# Patient Record
Sex: Female | Born: 1985
Health system: Southern US, Community
[De-identification: ages and names within clinical notes are randomized; demographics above are authoritative.]

## PROBLEM LIST (undated history)

## (undated) ENCOUNTER — Inpatient Hospital Stay (HOSPITAL_COMMUNITY): Payer: Self-pay

## (undated) DIAGNOSIS — F329 Major depressive disorder, single episode, unspecified: Secondary | ICD-10-CM

## (undated) DIAGNOSIS — O009 Unspecified ectopic pregnancy without intrauterine pregnancy: Secondary | ICD-10-CM

## (undated) DIAGNOSIS — F32A Depression, unspecified: Secondary | ICD-10-CM

## (undated) DIAGNOSIS — K219 Gastro-esophageal reflux disease without esophagitis: Secondary | ICD-10-CM

## (undated) DIAGNOSIS — R079 Chest pain, unspecified: Secondary | ICD-10-CM

## (undated) DIAGNOSIS — B999 Unspecified infectious disease: Secondary | ICD-10-CM

## (undated) DIAGNOSIS — R87629 Unspecified abnormal cytological findings in specimens from vagina: Secondary | ICD-10-CM

## (undated) DIAGNOSIS — F419 Anxiety disorder, unspecified: Secondary | ICD-10-CM

## (undated) DIAGNOSIS — O210 Mild hyperemesis gravidarum: Secondary | ICD-10-CM

## (undated) DIAGNOSIS — I10 Essential (primary) hypertension: Secondary | ICD-10-CM

## (undated) HISTORY — PX: KNEE SURGERY: SHX244

## (undated) HISTORY — DX: Mild hyperemesis gravidarum: O21.0

## (undated) HISTORY — DX: Essential (primary) hypertension: I10

## (undated) HISTORY — DX: Anxiety disorder, unspecified: F41.9

## (undated) HISTORY — PX: TONSILLECTOMY AND ADENOIDECTOMY: SUR1326

## (undated) HISTORY — PX: CHOLECYSTECTOMY: SHX55

## (undated) HISTORY — DX: Depression, unspecified: F32.A

## (undated) HISTORY — DX: Unspecified abnormal cytological findings in specimens from vagina: R87.629

## (undated) HISTORY — DX: Gastro-esophageal reflux disease without esophagitis: K21.9

---
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
## 2007-12-21 DIAGNOSIS — R1110 Vomiting, unspecified: Secondary | ICD-10-CM

## 2007-12-21 DIAGNOSIS — O411290 Chorioamnionitis, unspecified trimester, not applicable or unspecified: Secondary | ICD-10-CM

## 2014-11-14 ENCOUNTER — Encounter: Payer: Self-pay | Admitting: Family Medicine

## 2014-11-14 ENCOUNTER — Ambulatory Visit (INDEPENDENT_AMBULATORY_CARE_PROVIDER_SITE_OTHER): Payer: BLUE CROSS/BLUE SHIELD

## 2014-11-14 ENCOUNTER — Ambulatory Visit (INDEPENDENT_AMBULATORY_CARE_PROVIDER_SITE_OTHER): Payer: BLUE CROSS/BLUE SHIELD | Admitting: Family Medicine

## 2014-11-14 VITALS — BP 140/84 | HR 72 | Ht 66.0 in | Wt 269.0 lb

## 2014-11-14 DIAGNOSIS — M542 Cervicalgia: Secondary | ICD-10-CM | POA: Insufficient documentation

## 2014-11-14 DIAGNOSIS — Z23 Encounter for immunization: Secondary | ICD-10-CM

## 2014-11-14 DIAGNOSIS — R030 Elevated blood-pressure reading, without diagnosis of hypertension: Secondary | ICD-10-CM

## 2014-11-14 DIAGNOSIS — IMO0001 Reserved for inherently not codable concepts without codable children: Secondary | ICD-10-CM

## 2014-11-14 NOTE — Assessment & Plan Note (Signed)
9. Diagnostic hypertension. Recheck next visit

## 2014-11-14 NOTE — Assessment & Plan Note (Signed)
Likely muscle spasm and myofascial strain. Treat with Tylenol physical therapy. Additionally consider a heating pad and TENS unit. X-ray today. Avoid high-dose NSAIDs or muscle relaxers to the patient becoming pregnant.  recommend patient start taking her prenatal vitamins. Return in 3 weeks.
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
## 2014-11-14 NOTE — Patient Instructions (Signed)
Thank you for coming in today. Take Tylenol for pain as needed. Use heating pad. Attend physical therapy And x-ray  TENS UNIT: This is helpful for muscle pain and spasm.   Search and Purchase a TENS 7000 2nd edition at www.tenspros.com. It should be less than $30.     TENS unit instructions: Do not shower or bathe with the unit on Turn the unit off before removing electrodes or batteries If the electrodes lose stickiness add a drop of water to the electrodes after they are disconnected from the unit and place on plastic sheet. If you continued to have difficulty, call the TENS unit company to purchase more electrodes. Do not apply lotion on the skin area prior to use. Make sure the skin is clean and dry as this will help prolong the life of the electrodes. After use, always check skin for unusual red areas, rash or other skin difficulties. If there are any skin problems, does not apply electrodes to the same area. Never remove the electrodes from the unit by pulling the wires. Do not use the TENS unit or electrodes other than as directed. Do not change electrode placement without consultating your therapist or physician. Keep 2 fingers with between each electrode. Wear time ratio is 2:1, on to off times.    For example on for 30 minutes off for 15 minutes and then on for 30 minutes off for 15 minutes

## 2014-11-14 NOTE — Progress Notes (Signed)
Quick Note:  Normal, no changes. ______ 

## 2014-11-19 ENCOUNTER — Telehealth: Payer: Self-pay

## 2014-11-19 MED ORDER — HYDROCODONE-ACETAMINOPHEN 5-325 MG PO TABS: 1.0000 | ORAL_TABLET | Freq: Four times a day (QID) | ORAL | Status: DC | PRN

## 2014-11-19 NOTE — Telephone Encounter (Signed)
Patient states the Tylenol is not helping with her neck pain. She was advised to call back if pain was not controlled by Tylenol.

## 2014-11-19 NOTE — Telephone Encounter (Signed)
Tylenol not controlling pain.  Patient trying to become pregnant therefore NSAIDs and Muscle relaxers not indicated.  Will use low dose norco.  Return in 1 week if not better.

## 2014-11-19 NOTE — Telephone Encounter (Signed)
Attempted to contact Pt regarding new Rx. No answer. Left voicemail informing of Rx and provided callback information.

## 2014-11-21 ENCOUNTER — Ambulatory Visit: Payer: BLUE CROSS/BLUE SHIELD | Admitting: Rehabilitative and Restorative Service Providers"

## 2014-11-25 ENCOUNTER — Encounter: Payer: Self-pay | Admitting: Rehabilitative and Restorative Service Providers"

## 2014-11-25 ENCOUNTER — Ambulatory Visit (INDEPENDENT_AMBULATORY_CARE_PROVIDER_SITE_OTHER): Payer: Self-pay | Admitting: Rehabilitative and Restorative Service Providers"

## 2014-11-25 DIAGNOSIS — M436 Torticollis: Secondary | ICD-10-CM

## 2014-11-25 DIAGNOSIS — Z7409 Other reduced mobility: Secondary | ICD-10-CM

## 2014-11-25 DIAGNOSIS — R293 Abnormal posture: Secondary | ICD-10-CM

## 2014-11-25 DIAGNOSIS — M542 Cervicalgia: Secondary | ICD-10-CM
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
## 2014-11-25 NOTE — Patient Instructions (Signed)
Work on posture throughout the day - lift chest and let shoulders relax down and back  [employer] work ~ 4 Careers adviser.   Axial Extension (Chin Tuck)   Pull chin in and lengthen back of neck. Hold __10__ seconds while counting out loud. Repeat __10__ times. Do __several__ sessions per day. Can do lying down on back head supported on pillow so spine is in a neutral position.    Shoulder Blade Squeeze   Rotate shoulders back, then squeeze shoulder blades down and back hold 10 sec Repeat __10__ times. Do _several___ sessions per day. Can use noodle along spine.  Scapula Adduction With Pectorals, Low   Stand in doorframe with palms against frame and arms at 45. Lean forward and squeeze shoulder blades. Hold _30__ seconds. Repeat _3__ times per session. Do _3__ sessions per day.     Scapula Adduction With Pectorals, Mid-Range   Stand in doorframe with palms against frame and arms at 90. Lean forward and squeeze shoulder blades. Hold __30_ seconds. Repeat 3___ times per session. Do _3__ sessions per day.    \Scapula Adduction With Pectorals, High   Stand in doorframe with palms against frame and arms at 120. Lean forward and squeeze shoulder blades. Hold __30_ seconds. Repeat _3__ times per session. Do _3__ sessions per day.

## 2014-11-27 ENCOUNTER — Encounter: Payer: BLUE CROSS/BLUE SHIELD | Admitting: Physical Therapy

## 2014-12-02 ENCOUNTER — Ambulatory Visit (INDEPENDENT_AMBULATORY_CARE_PROVIDER_SITE_OTHER): Payer: BLUE CROSS/BLUE SHIELD | Admitting: Physical Therapy

## 2014-12-02 ENCOUNTER — Encounter: Payer: Self-pay | Admitting: Physical Therapy

## 2014-12-02 DIAGNOSIS — R293 Abnormal posture: Secondary | ICD-10-CM | POA: Diagnosis not present

## 2014-12-02 DIAGNOSIS — M436 Torticollis: Secondary | ICD-10-CM | POA: Diagnosis not present

## 2014-12-02 DIAGNOSIS — M542 Cervicalgia: Secondary | ICD-10-CM | POA: Diagnosis not present

## 2014-12-02 DIAGNOSIS — Z7409 Other reduced mobility: Secondary | ICD-10-CM

## 2014-12-02 NOTE — Therapy (Signed)
[place], [place], [address] Phone: [phone]   Fax:  [phone]  Physical Therapy Treatment  Patient Details  Name: Ann Brock MRN: [id number] Date of Birth: 1985/06/09 Referring Provider:  Gregor Hams, MD  Encounter Date: 12/02/2014      PT End of Session - 12/02/14 1113    Visit Number 2   Date for PT Re-Evaluation 12/30/14   PT Start Time 1106   PT Stop Time 1201   PT Time Calculation (min) 55 min      History reviewed. No pertinent past medical history.  Past Surgical History  Procedure Laterality Date  . Cholecystectomy    . Cesarean section    . Knee surgery    . Tonsillectomy and adenoidectomy      There were no vitals filed for this visit.  Visit Diagnosis:  Neck pain  Abnormal posture  Stiffness of neck  Impaired mobility and endurance      Subjective Assessment - 12/02/14 1106    Subjective Pt has been trying to wean off medication over the last two days and she is very sore. Doing OK with her HEP    Currently in Pain? Yes   Pain Score 7    Pain Location Neck   Pain Orientation Mid   Pain Descriptors / Indicators Throbbing  snapping of rubber band down the center   Pain Type Acute pain   Pain Frequency Constant   Aggravating Factors  all the same as last visits    Pain Relieving Factors medication, ice . some heat.                          [facility] Adult PT Treatment/Exercise - 12/02/14 0001    Exercises   Exercises Neck   Neck Exercises: Machines for Strengthening   UBE (Upper Arm Bike) L1 x 4' alt FWB/BWD   Neck Exercises: Standing   Other Standing Exercises shouder shrugs x15     Neck Exercises: Supine   Other Supine Exercise 10 reps yellow band, OH, horizontl abd, SASH and ER   Shoulder Exercises: Standing   External Rotation Strengthening;Both;20 reps;Theraband  leaning on noodle   Theraband Level (Shoulder External  Rotation) Level 2 (Red)   Extension Strengthening;Both;20 reps  leaning on noodle   Moist Heat Therapy   Number Minutes Moist Heat 15 Minutes   Moist Heat Location Cervical   Electrical Stimulation   Electrical Stimulation Location bilat mid/lower cervical paraspinals   Electrical Stimulation Action IFC   Electrical Stimulation Parameters to tolerance   Electrical Stimulation Goals Pain   Manual Therapy   Manual Therapy Joint mobilization;Soft tissue mobilization;Manual Traction   Joint Mobilization attempted cervical mobs - pt unable to tolerate   Soft tissue mobilization bilat pecs, periocciput, upper traps/levators    Manual Traction cervical                 PT Education - 12/02/14 1116    Education provided Yes   Education Details HEP with yellow band   Person(s) Educated Patient   Methods Explanation;Demonstration;Handout   Comprehension Returned demonstration             PT Long Term Goals - 12/02/14 1250    PT LONG TERM GOAL #1   Title Pt I in HEP for discharge 12/30/14   Status On-going   PT LONG TERM GOAL #2   Title Cervical ROM WNL's and painfree  12/30/14   Status On-going   PT LONG TERM GOAL #3   Title Patient reports ability to turn head to check traffic when driving 82/70/78   Status On-going   PT LONG TERM GOAL #4   Title Decrease FOTO to </= 38% limitation 12/30/14   Status On-going               Plan - 12/02/14 1148    Clinical Impression Statement This is patients second visit, no goals met,  She continues to have cervical pain however responds well to exercise decreasing pain.  Her posture adds extra stress on the cervical  musculature.    Pt will benefit from skilled therapeutic intervention in order to improve on the following deficits Decreased range of motion;Decreased mobility;Pain;Increased fascial restricitons;Decreased activity tolerance;Decreased endurance   Rehab Potential Good   PT Frequency 2x / week   PT Duration 6  weeks   PT Treatment/Interventions Patient/family education;ADLs/Self Care Home Management;Therapeutic exercise;Therapeutic activities;Manual techniques;Cryotherapy;Electrical Stimulation;Moist Heat;Ultrasound;Traction;Dry needling   PT Next Visit Plan review HEP; continue work on posture and alignment; add manual work for cervical spine with passive stretching; progress with gentle cervical stretching   Consulted and Agree with Plan of Care Patient        Problem List Patient Active Problem List   Diagnosis Date Noted  . Neck pain 11/14/2014  . Elevated blood pressure 11/14/2014    Jeral Pinch PT 12/02/2014, 12:52 PM  [facility] [place], [place], [address] Phone: [phone]   Fax:  [phone]

## 2014-12-02 NOTE — Patient Instructions (Signed)
Over Head Pull: Narrow Grip     [place] [phone]   On back, knees bent, feet flat, band across thighs, elbows straight but relaxed. Pull hands apart (start). Keeping elbows straight, bring arms up and over head, hands toward floor. Keep pull steady on band. Hold momentarily. Return slowly, keeping pull steady, back to start. Repeat _10__ times. Band color yellow______   Side Pull: Double Arm   On back, knees bent, feet flat. Arms perpendicular to body, shoulder level, elbows straight but relaxed. Pull arms out to sides, elbows straight. Resistance band comes across collarbones, hands toward floor. Hold momentarily. Slowly return to starting position. Repeat 10___ times. Band color _yellow____   Sash   On back, knees bent, feet flat, left hand on left hip, right hand above left. Pull right arm DIAGONALLY (hip to shoulder) across chest. Bring right arm along head toward floor. Hold momentarily. Slowly return to starting position. Repeat _10__ times. Do with left arm. Band color _yellow_____   Shoulder Rotation: Double Arm   On back, knees bent, feet flat, elbows tucked at sides, bent 90, hands palms up. Pull hands apart and down toward floor, keeping elbows near sides. Hold momentarily. Slowly return to starting position. Repeat 10___ times. Band color __yello____

## 2014-12-04 ENCOUNTER — Ambulatory Visit (INDEPENDENT_AMBULATORY_CARE_PROVIDER_SITE_OTHER): Payer: BLUE CROSS/BLUE SHIELD | Admitting: Family Medicine

## 2014-12-04 ENCOUNTER — Encounter: Payer: Self-pay | Admitting: Family Medicine

## 2014-12-04 VITALS — BP 139/90 | HR 83 | Ht 66.0 in | Wt 268.0 lb

## 2014-12-04 DIAGNOSIS — IMO0001 Reserved for inherently not codable concepts without codable children: Secondary | ICD-10-CM

## 2014-12-04 DIAGNOSIS — Z3490 Encounter for supervision of normal pregnancy, unspecified, unspecified trimester: Secondary | ICD-10-CM | POA: Insufficient documentation

## 2014-12-04 DIAGNOSIS — R030 Elevated blood-pressure reading, without diagnosis of hypertension: Secondary | ICD-10-CM | POA: Diagnosis not present

## 2014-12-04 DIAGNOSIS — Z8759 Personal history of other complications of pregnancy, childbirth and the puerperium: Secondary | ICD-10-CM | POA: Diagnosis not present

## 2014-12-04 DIAGNOSIS — N3000 Acute cystitis without hematuria: Secondary | ICD-10-CM

## 2014-12-04 DIAGNOSIS — Z3491 Encounter for supervision of normal pregnancy, unspecified, first trimester: Secondary | ICD-10-CM | POA: Diagnosis not present

## 2014-12-04 LAB — POCT URINALYSIS DIPSTICK
BILIRUBIN UA: NEGATIVE
Blood, UA: NEGATIVE
Glucose, UA: NEGATIVE
Ketones, UA: NEGATIVE
NITRITE UA: NEGATIVE
PH UA: 6
Protein, UA: NEGATIVE
Spec Grav, UA: 1.025
Urobilinogen, UA: 0.2

## 2014-12-04 LAB — POCT URINE PREGNANCY: PREG TEST UR: POSITIVE — AB

## 2014-12-04 NOTE — Assessment & Plan Note (Signed)
No indication at this time for ultrasound. If patient has pain in her bleeding will obtain transvaginal ultrasound to confirm intrauterine pregnancy and obtain serial serum hCG.

## 2014-12-04 NOTE — Progress Notes (Signed)
Ann Brock is a 29 y.o. female who presents to [facility] today for pregnancy.  Her last visit. Was August 21. The Mirena IUD was removed on July 25. She is intending to become pregnant and is taking prenatal vitamins. She currently feels well. This is her fifth pregnancy. She is a G5 P2022 (one miscarriage, and one ectopic pregnancy treated medically). She notes that she's had elevated blood pressure prior to this pregnancy, and her previous pregnancies were complicated by hyperemesis gravidarum. She denies any urinary symptoms.   No past medical history on file. Past Surgical History  Procedure Laterality Date  . Cholecystectomy    . Cesarean section    . Knee surgery    . Tonsillectomy and adenoidectomy     Social History  Substance Use Topics  . Smoking status: Former Research scientist (life sciences)  . Smokeless tobacco: Not on file  . Alcohol Use: 0.0 oz/week    0 Standard drinks or equivalent per week   family history includes Alcohol abuse in her paternal grandfather and paternal grandmother; Cancer in her maternal grandfather, maternal grandmother, paternal grandfather, and paternal grandmother; Depression in her mother; Diabetes in her maternal grandmother; Drug abuse in her father; Heart disease in her father; Hyperlipidemia in her father; Hypertension in her father and mother; Stroke in her father, maternal grandfather, maternal grandmother, paternal grandfather, and paternal grandmother.  ROS as above Medications: Current Outpatient Prescriptions  Medication Sig Dispense Refill  . Prenatal MV-Min-Fe Fum-FA-DHA (PRENATAL 1 PO) Take by mouth.     No current facility-administered medications for this visit.   Allergies  Allergen Reactions  . Aspirin Anaphylaxis  . Cephalosporins Anaphylaxis  . Contrast Media  [Iodinated Diagnostic Agents] Anaphylaxis     Exam:  BP 139/90 mmHg  Pulse 83  Ht 5\' 6"  (1.676 m)  Wt 268 lb (121.564 kg)  BMI 43.28 kg/m2   LMP 11/07/2014 Gen: Well NAD HEENT: EOMI,  MMM Lungs: Normal work of breathing. CTABL Heart: RRR no MRG Abd: NABS, Soft. Nondistended, Nontender Exts: Brisk capillary refill, warm and well perfused.   Point-of-care urine pregnancy test positive. Urinalysis dipstick negative protein. Small leukocyte esterase  No results found for this or any previous visit (from the past 24 hour(s)). No results found.   Please see individual assessment and plan sections.

## 2014-12-04 NOTE — Patient Instructions (Signed)
Thank you for coming in today. Follow up with OBGYN.  Return sooner if you have problems.

## 2014-12-04 NOTE — Assessment & Plan Note (Signed)
Refer to OB/GYN. Continue prenatal vitamins.

## 2014-12-04 NOTE — Assessment & Plan Note (Signed)
OB/GYN will manage

## 2014-12-05 ENCOUNTER — Ambulatory Visit (INDEPENDENT_AMBULATORY_CARE_PROVIDER_SITE_OTHER): Payer: BLUE CROSS/BLUE SHIELD | Admitting: Rehabilitative and Restorative Service Providers"

## 2014-12-05 ENCOUNTER — Encounter: Payer: Self-pay | Admitting: Rehabilitative and Restorative Service Providers"

## 2014-12-05 DIAGNOSIS — M436 Torticollis: Secondary | ICD-10-CM | POA: Diagnosis not present

## 2014-12-05 DIAGNOSIS — R293 Abnormal posture: Secondary | ICD-10-CM | POA: Diagnosis not present

## 2014-12-05 DIAGNOSIS — N390 Urinary tract infection, site not specified: Secondary | ICD-10-CM | POA: Insufficient documentation

## 2014-12-05 DIAGNOSIS — Z7409 Other reduced mobility: Secondary | ICD-10-CM | POA: Diagnosis not present

## 2014-12-05 DIAGNOSIS — M542 Cervicalgia: Secondary | ICD-10-CM

## 2014-12-05 LAB — URINE CULTURE

## 2014-12-05 MED ORDER — NITROFURANTOIN MONOHYD MACRO 100 MG PO CAPS: 100.0000 mg | ORAL_CAPSULE | Freq: Two times a day (BID) | ORAL | Status: DC

## 2014-12-05 NOTE — Patient Instructions (Signed)
Resisted External Rotation: in Neutral - Bilateral   PALMS UP Sit or stand, tubing in both hands, elbows at sides, bent to 90, forearms forward. Pinch shoulder blades together and rotate forearms out. Keep elbows at sides. Repeat __10__ times per set. Do _2-3___ sets per session. Do _2-3___ sessions per day.   Low Row: Standing   Face anchor, feet shoulder width apart. Palms up, pull arms back, squeezing shoulder blades together. Repeat 10__ times per set. Do 2-3__ sets per session. Do 2-3__ sessions per week. Anchor Height: Waist     Strengthening: Resisted Extension   Hold tubing in right hand, arm forward. Pull arm back, elbow straight. Repeat _10___ times per set. Do 2-3____ sets per session. Do 2-3____ sessions per day.     Lying on back on swim noodle arms out to side 3-5 min Can bend elbows to release pull then straighten again.

## 2014-12-05 NOTE — Therapy (Signed)
[place], [place], [address] Phone: [phone]   Fax:  [phone]  Physical Therapy Treatment  Patient Details  Name: Ann Brock MRN: [id number] Date of Birth: 21-Sep-1985 Referring Marcell Pfeifer:  Gregor Hams, MD  Encounter Date: 12/05/2014      PT End of Session - 12/05/14 0902    Visit Number 3   Number of Visits 12   Date for PT Re-Evaluation 12/30/14   PT Start Time 0858   PT Stop Time 0945   PT Time Calculation (min) 47 min   Activity Tolerance Patient tolerated treatment well      History reviewed. No pertinent past medical history.  Past Surgical History  Procedure Laterality Date  . Cholecystectomy    . Cesarean section    . Knee surgery    . Tonsillectomy and adenoidectomy      There were no vitals filed for this visit.  Visit Diagnosis:  Neck pain  Abnormal posture  Stiffness of neck  Impaired mobility and endurance      Subjective Assessment - 12/05/14 0858    Subjective Found out she is pregnant. Not taking medication now and can feel a difference - more pain. Discussed use of e-stim for neck with pregnancy and patient is OK with continuing use - not contraindicated with distance from uterus    Currently in Pain? Yes   Pain Score 6    Pain Location Neck   Pain Orientation Mid   Pain Descriptors / Indicators Throbbing                         [facility] Adult PT Treatment/Exercise - 12/05/14 0001    Neck Exercises: Machines for Strengthening   UBE (Upper Arm Bike) L1 x 4' alt FWB/BWD   Neck Exercises: Standing   Neck Retraction 10 reps;10 secs   Other Standing Exercises shouder shrugs x15     Neck Exercises: Supine   Neck Retraction 5 reps;10 secs   Shoulder Exercises: Standing   External Rotation Strengthening;Both;20 reps;Theraband  leaning on noodle   Theraband Level (Shoulder External Rotation) Level 2 (Red)   Extension  Strengthening;Both;20 reps  leaning on noodle   Theraband Level (Shoulder Extension) Level 2 (Red)   Row Strengthening;Both;20 reps;Theraband   Theraband Level (Shoulder Row) Level 2 (Red)   Retraction 10 reps   Shoulder Exercises: IT sales professional Limitations doorway stretch 3 positions 3 reps 30 sec   Moist Heat Therapy   Number Minutes Moist Heat 15 Minutes   Moist Heat Location Cervical   Electrical Stimulation   Electrical Stimulation Location bilat mid/lower cervical paraspinals   Electrical Stimulation Action IFC   Electrical Stimulation Parameters to tolerance   Electrical Stimulation Goals Pain   Manual Therapy   Soft tissue mobilization bilat pecs, periocciput, upper traps/levators    Passive ROM cervical stretch to tolerance - not full ROM into flexion x2 15 sec hold; lateral flex x1 15 sec hold pt supine   Manual Traction cervical                 PT Education - 12/05/14 0931    Education provided Yes   Education Details working on posture and alignment/added ex for home; issued red TB for home   Person(s) Educated Patient   Methods Explanation;Demonstration;Tactile cues;Verbal cues;Handout   Comprehension Verbalized understanding;Returned demonstration;Verbal cues required;Tactile cues required  PT Long Term Goals - 12/02/14 1250    PT LONG TERM GOAL #1   Title Pt I in HEP for discharge 12/30/14   Status On-going   PT LONG TERM GOAL #2   Title Cervical ROM WNL's and painfree 12/30/14   Status On-going   PT LONG TERM GOAL #3   Title Patient reports ability to turn head to check traffic when driving 33/54/56   Status On-going   PT LONG TERM GOAL #4   Title Decrease FOTO to </= 38% limitation 12/30/14   Status On-going               Plan - 12/05/14 0903    Clinical Impression Statement Patient is pregnant and unable to use medication now. We will continue to work on posture and alignment, progressing with stretching pecs  and posterior shoulder girdle    Pt will benefit from skilled therapeutic intervention in order to improve on the following deficits Decreased range of motion;Decreased mobility;Pain;Increased fascial restricitons;Decreased activity tolerance;Decreased endurance   Rehab Potential Good   PT Frequency 2x / week   PT Duration 6 weeks   PT Treatment/Interventions Patient/family education;ADLs/Self Care Home Management;Therapeutic exercise;Therapeutic activities;Manual techniques;Cryotherapy;Electrical Stimulation;Moist Heat;Ultrasound;Traction;Dry needling   PT Next Visit Plan review HEP; continue work on posture and alignment; add manual work for cervical spine with passive stretching; progress with gentle cervical stretching   PT Home Exercise Plan postural correction; HEP   Consulted and Agree with Plan of Care Patient        Problem List Patient Active Problem List   Diagnosis Date Noted  . Normal pregnancy 12/04/2014  . History of ectopic pregnancy 12/04/2014  . Neck pain 11/14/2014  . Elevated blood pressure 11/14/2014    Celyn Nilda Simmer PT, MPH 12/05/2014, 9:38 AM  [facility] [place], [place], [address] Phone: [phone]   Fax:  [phone]

## 2014-12-05 NOTE — Assessment & Plan Note (Signed)
Patient returned to the front desk of the clinic the next day complaining of urinary frequency. Based on urinalysis with positive leukocyte esterase. We'll start nitrofurantoin. Culture pending.

## 2014-12-05 NOTE — Addendum Note (Signed)
Addended by: Gregor Hams on: 12/05/2014 10:24 AM   Modules accepted: Orders

## 2014-12-06 NOTE — Progress Notes (Signed)
Quick Note:  Culture shows multiple different type of bacteria. Continue antibiotics. ______

## 2014-12-09 ENCOUNTER — Ambulatory Visit (INDEPENDENT_AMBULATORY_CARE_PROVIDER_SITE_OTHER): Payer: BLUE CROSS/BLUE SHIELD | Admitting: Family Medicine

## 2014-12-09 ENCOUNTER — Encounter: Payer: Self-pay | Admitting: Family Medicine

## 2014-12-09 VITALS — BP 132/92 | HR 81 | Ht 66.0 in | Wt 269.0 lb

## 2014-12-09 DIAGNOSIS — N3000 Acute cystitis without hematuria: Secondary | ICD-10-CM

## 2014-12-09 DIAGNOSIS — M542 Cervicalgia: Secondary | ICD-10-CM

## 2014-12-09 DIAGNOSIS — Z3491 Encounter for supervision of normal pregnancy, unspecified, first trimester: Secondary | ICD-10-CM | POA: Diagnosis not present

## 2014-12-09 NOTE — Assessment & Plan Note (Signed)
Doing well. Continue Tylenol physical therapy and TENS unit

## 2014-12-09 NOTE — Progress Notes (Signed)
Ann Brock is a 29 y.o. female who presents to [facility]  today for follow-up neck pain and urinary tract infection. Patient was seen recently diagnosed with a cervical neck strain. X-ray was essentially normal. She was treated with Tylenol and physical therapy. She's had a few visits with physical therapy this helped significantly. She notes the pain is still present but is less than it has been. She denies any significant radiating pain weakness or numbness.  Additionally patient notes that she was seen recently and diagnosed with pregnancy. Urinalysis at that time showed leukocyte esterase however at this time she was asymptomatic. She returned the next day noted urinary frequency. She was given nitrofurantoin to take. She has not started taking it yet and is still symptomatic. She was waiting on culture results. Urine culture showed 100,000 colony-forming units of multiple morphotypes. She denies any abdominal pain fevers or chills.  Pregnancy: Currently going well. Very early. She is taking prenatal vitamins. She is an appointment with her OB/GYN doctor on October 12.  She has sepsis okay to eat hot dogs for lunch meats. She was told by a friend that she should not do this while pregnant. She was able to safely consume lunch meats and hot dogs during her last 2 pregnancies and wonders what's going on.   No past medical history on file. Past Surgical History  Procedure Laterality Date  . Cholecystectomy    . Cesarean section    . Knee surgery    . Tonsillectomy and adenoidectomy     Social History  Substance Use Topics  . Smoking status: Former Research scientist (life sciences)  . Smokeless tobacco: Not on file  . Alcohol Use: 0.0 oz/week    0 Standard drinks or equivalent per week   family history includes Alcohol abuse in her paternal grandfather and paternal grandmother; Cancer in her maternal grandfather, maternal grandmother, paternal grandfather, and paternal  grandmother; Depression in her mother; Diabetes in her maternal grandmother; Drug abuse in her father; Heart disease in her father; Hyperlipidemia in her father; Hypertension in her father and mother; Stroke in her father, maternal grandfather, maternal grandmother, paternal grandfather, and paternal grandmother.  ROS as above Medications: Current Outpatient Prescriptions  Medication Sig Dispense Refill  . nitrofurantoin, macrocrystal-monohydrate, (MACROBID) 100 MG capsule Take 1 capsule (100 mg total) by mouth 2 (two) times daily. 14 capsule 0  . Prenatal MV-Min-Fe Fum-FA-DHA (PRENATAL 1 PO) Take by mouth.     No current facility-administered medications for this visit.   Allergies  Allergen Reactions  . Aspirin Anaphylaxis  . Cephalosporins Anaphylaxis  . Contrast Media  [Iodinated Diagnostic Agents] Anaphylaxis     Exam:  BP 132/92 mmHg  Pulse 81  Ht 5\' 6"  (1.676 m)  Wt 269 lb (122.018 kg)  BMI 43.44 kg/m2  LMP 11/07/2014 Gen: Well NAD HEENT: EOMI,  MMM Lungs: Normal work of breathing. CTABL Heart: RRR no MRG Abd: NABS, Soft. Nondistended, Nontender Exts: Brisk capillary refill, warm and well perfused.  Neck: Nontender to midline normal neck range of motion. Negative Spurling's test. Upper extremity reflexes sensation and strength are equal and normal throughout.  No results found for this or any previous visit (from the past 24 hour(s)). No results found.   Please see individual assessment and plan sections.

## 2014-12-09 NOTE — Assessment & Plan Note (Signed)
Discussed risk of listeria. Discussed risk of motor vehicle collision death at 10.3 per 100,000 versus listeria at 0.26 per 100,000. Recommend avoiding risk of listeria if possible by reducing intake of hot dogs and daily meat etc. however discussed risks versus benefit. Follow-up with OB/GYN

## 2014-12-09 NOTE — Patient Instructions (Signed)
Thank you for coming in today. Continue physical therapy.  Take the antibiotic.  Return as needed.  Follow up with OBGYN.  Look up Listeria info about hotdogs and cheeses and lunch meats.   Use a TENS unit and Heating Pads.

## 2014-12-09 NOTE — Assessment & Plan Note (Signed)
Recommend start nitrofurantoin. Return as needed

## 2014-12-10 ENCOUNTER — Encounter: Payer: BLUE CROSS/BLUE SHIELD | Admitting: Physical Therapy

## 2014-12-11 ENCOUNTER — Ambulatory Visit (INDEPENDENT_AMBULATORY_CARE_PROVIDER_SITE_OTHER): Payer: Self-pay | Admitting: Physical Therapy

## 2014-12-11 DIAGNOSIS — R293 Abnormal posture: Secondary | ICD-10-CM

## 2014-12-11 DIAGNOSIS — M542 Cervicalgia: Secondary | ICD-10-CM

## 2014-12-11 DIAGNOSIS — M436 Torticollis: Secondary | ICD-10-CM

## 2014-12-11 DIAGNOSIS — Z7409 Other reduced mobility: Secondary | ICD-10-CM

## 2014-12-11 NOTE — Therapy (Signed)
[place], [place], [address] Phone: [phone]   Fax:  [phone]  Physical Therapy Treatment  Patient Details  Name: Ann Brock MRN: [id number] Date of Birth: 06-09-1985 Referring Provider:  Gregor Hams, MD  Encounter Date: 12/11/2014      PT End of Session - 12/11/14 0852    Visit Number 4   Number of Visits 12   Date for PT Re-Evaluation 12/30/14   PT Start Time 0849   PT Stop Time 0946   PT Time Calculation (min) 57 min      No past medical history on file.  Past Surgical History  Procedure Laterality Date  . Cholecystectomy    . Cesarean section    . Knee surgery    . Tonsillectomy and adenoidectomy      There were no vitals filed for this visit.  Visit Diagnosis:  Neck pain  Abnormal posture  Stiffness of neck  Impaired mobility and endurance      Subjective Assessment - 12/11/14 0852    Subjective Pt reports no change since last visit.  Pt using heating pad and TENS unit at end of day to decrease pain.  Has been performing HEP 2x/day.    Currently in Pain? Yes   Pain Score 5    Pain Location Neck   Pain Orientation Mid;Right;Left   Pain Descriptors / Indicators Aching;Throbbing   Aggravating Factors  weather, looking up and down   Pain Relieving Factors heat, ice, TENS machine.             [facility] PT Assessment - 12/11/14 0001    AROM   AROM Assessment Site --  pain with all motions   Cervical Flexion 25   Cervical Extension 35   Cervical - Right Side Bend 20   Cervical - Left Side Bend 22   Cervical - Right Rotation 35   Cervical - Left Rotation 30             [facility] Adult PT Treatment/Exercise - 12/11/14 0001    Neck Exercises: Seated   Upper Extremity D2 5 reps;Flexion;Theraband   Theraband Level (UE D2) Level 1 (Yellow)   Other Seated Exercise trial of head presses and shoulder presses x 5 reps each (with head support)   Neck  Exercises: Supine   Neck Retraction 5 reps;3 secs  chin tucks.    Shoulder Flexion Right;Left;5 reps   Other Supine Exercise Hooklying on 1/2 foam roll: bilateral shoulder horizontal abduction held 1 min for stretch, then 5 snow angels (to 90 deg- unable to tolerate higher than this due to increased pain in post neck).    Other Supine Exercise Head presses 5 sec hold x 10, shoulder presses 5 sec hold x 10 reps; Horizontal shoulder abd with red band x 10, Sash x 10 each arm, shoulder ER with red band x 10    Moist Heat Therapy   Number Minutes Moist Heat 15 Minutes   Moist Heat Location Cervical   Electrical Stimulation   Electrical Stimulation Location bilat mid/lower cervical paraspinals   Electrical Stimulation Action IFC   Electrical Stimulation Parameters to tolerance    Electrical Stimulation Goals Pain     Prior to MHP and estim, pt performed neck stretches (gentle): cervical rotation, lateral flexion, flexion - all head 15 sec x 2 reps each.            PT Education - 12/11/14 1350    Education  provided Yes   Education Details red band use if supine, yellow if sitting    Person(s) Educated Patient   Methods Explanation   Comprehension Verbalized understanding;Returned demonstration             PT Long Term Goals - 12/02/14 1250    PT LONG TERM GOAL #1   Title Pt I in HEP for discharge 12/30/14   Status On-going   PT LONG TERM GOAL #2   Title Cervical ROM WNL's and painfree 12/30/14   Status On-going   PT LONG TERM GOAL #3   Title Patient reports ability to turn head to check traffic when driving 38/25/05   Status On-going   PT LONG TERM GOAL #4   Title Decrease FOTO to </= 38% limitation 12/30/14   Status On-going               Plan - 12/11/14 0903    Clinical Impression Statement Pt continues with limited cervical ROM and pain with these motions. Pt tolerated unilateral doorway pec stretch better than bilateral. Pt tolerated red band better in  supine, yellow band in sitting (pt informed to make this modification for HEP).  Pt reported decrease in pain by 1 point with ther ex and 2-3 additional points after MHP and estim to neck/thoracic area.     Pt will benefit from skilled therapeutic intervention in order to improve on the following deficits Decreased range of motion;Decreased mobility;Pain;Increased fascial restricitons;Decreased activity tolerance;Decreased endurance   Rehab Potential Good   PT Frequency 2x / week   PT Duration 6 weeks   PT Treatment/Interventions Patient/family education;ADLs/Self Care Home Management;Therapeutic exercise;Therapeutic activities;Manual techniques;Cryotherapy;Electrical Stimulation;Moist Heat;Ultrasound;Traction;Dry needling   PT Next Visit Plan Continue progressive postural work/stretching and strengthening of neck. Manual work and modalities as needed.    Consulted and Agree with Plan of Care Patient        Problem List Patient Active Problem List   Diagnosis Date Noted  . UTI (urinary tract infection) 12/05/2014  . Normal pregnancy 12/04/2014  . History of ectopic pregnancy 12/04/2014  . Neck pain 11/14/2014  . Elevated blood pressure 11/14/2014    Kerin Perna, PTA 12/11/2014 1:56 PM  [facility] [place], [place], [address] Phone: [phone]   Fax:  [phone]

## 2014-12-17 ENCOUNTER — Ambulatory Visit (INDEPENDENT_AMBULATORY_CARE_PROVIDER_SITE_OTHER): Payer: Self-pay | Admitting: Physical Therapy

## 2014-12-17 DIAGNOSIS — M542 Cervicalgia: Secondary | ICD-10-CM

## 2014-12-17 DIAGNOSIS — R293 Abnormal posture: Secondary | ICD-10-CM

## 2014-12-17 NOTE — Therapy (Signed)
[place], [place], [address] Phone: [phone]   Fax:  [phone]  Physical Therapy Treatment  Patient Details  Name: Ann Brock MRN: [id number] Date of Birth: 04/23/1985 Referring Provider:  Gregor Hams, MD  Encounter Date: 12/17/2014      PT End of Session - 12/17/14 0936    Visit Number 5   Number of Visits 12   Date for PT Re-Evaluation 12/30/14   PT Start Time 7124   PT Stop Time 0944   PT Time Calculation (min) 57 min   Activity Tolerance Patient tolerated treatment well      No past medical history on file.  Past Surgical History  Procedure Laterality Date  . Cholecystectomy    . Cesarean section    . Knee surgery    . Tonsillectomy and adenoidectomy      There were no vitals filed for this visit.  Visit Diagnosis:  Neck pain  Abnormal posture      Subjective Assessment - 12/17/14 0855    Subjective Pt reports she was "doing pretty good" after last session, until she had to work double shifts (18hrs) over weekend.  She states that she heard a pop in neck when just looking around.  Pain no longer throbbing, just achey.    Pertinent History 3 knee surgeries; gall bladder surgery; c-section; currently pregnant (due May 27)    Currently in Pain? Yes   Pain Score 4    Pain Location Neck   Pain Orientation Mid;Left;Right   Pain Descriptors / Indicators Dull;Aching   Aggravating Factors  looking up/ down   Pain Relieving Factors heat, ice, TENS machine.             [facility] Adult PT Treatment/Exercise - 12/17/14 0001    Exercises   Exercises Shoulder;Neck   Neck Exercises: Machines for Strengthening   UBE (Upper Arm Bike) L2 x 4.5' alt FWB/BWD  standing   Neck Exercises: Theraband   Rows Red;10 reps  2 sets; seated   Neck Exercises: Seated   Upper Extremity D2 Flexion;Theraband;10 reps   Theraband Level (UE D2) Level 1 (Yellow)  (1st set x 10, 2nd set 15  reps)   Shoulder Exercises: Seated   Other Seated Exercises Wood chop with yellow band x 8 reps each side    Shoulder Exercises: IT sales professional Limitations doorway stretch 3 positions 3 reps 30 sec   Moist Heat Therapy   Number Minutes Moist Heat 15 Minutes   Moist Heat Location Cervical   Electrical Stimulation   Electrical Stimulation Location bilat mid/lower cervical paraspinals   Electrical Stimulation Action IFC    Electrical Stimulation Parameters to tolerance x 15 min    Electrical Stimulation Goals Pain   Manual Therapy   Manual Therapy Soft tissue massage with Edge tool assistance to Rt/Lt upper trap, cervical paraspinals, rhomboids, levator - to decrease fascial restrictions and pain.    Neck Exercises: Stretches   Upper Trapezius Stretch 20 seconds;2 reps   Levator Stretch 2 reps;20 seconds                     PT Long Term Goals - 12/02/14 1250    PT LONG TERM GOAL #1   Title Pt I in HEP for discharge 12/30/14   Status On-going   PT LONG TERM GOAL #2   Title Cervical ROM WNL's and painfree 12/30/14   Status On-going   PT  LONG TERM GOAL #3   Title Patient reports ability to turn head to check traffic when driving 88/32/54   Status On-going   PT LONG TERM GOAL #4   Title Decrease FOTO to </= 38% limitation 12/30/14   Status On-going               Plan - 12/17/14 0936    Clinical Impression Statement Pt tolerated new exercises with slight decrease in pain. Pt reported decreased tightness/improved mobility in cervical spine after manual therapy.  Progressing well towards goals.    Pt will benefit from skilled therapeutic intervention in order to improve on the following deficits Decreased range of motion;Decreased mobility;Pain;Increased fascial restricitons;Decreased activity tolerance;Decreased endurance   Rehab Potential Good   PT Frequency 2x / week   PT Duration 6 weeks   PT Treatment/Interventions Patient/family education;ADLs/Self  Care Home Management;Therapeutic exercise;Therapeutic activities;Manual techniques;Cryotherapy;Electrical Stimulation;Moist Heat;Ultrasound;Traction;Dry needling   PT Next Visit Plan Continue progressive postural work/stretching and strengthening of neck. Manual work and modalities as needed. Progress HEP to include new exercises if tolerated after next session.    Consulted and Agree with Plan of Care Patient        Problem List Patient Active Problem List   Diagnosis Date Noted  . UTI (urinary tract infection) 12/05/2014  . Normal pregnancy 12/04/2014  . History of ectopic pregnancy 12/04/2014  . Neck pain 11/14/2014  . Elevated blood pressure 11/14/2014   Kerin Perna, PTA 12/17/2014 1:22 PM  [facility] [place], [place], [address] Phone: [phone]   Fax:  [phone]

## 2014-12-18 ENCOUNTER — Inpatient Hospital Stay (HOSPITAL_COMMUNITY)
Admission: AD | Admit: 2014-12-18 | Discharge: 2014-12-18 | Disposition: A | Discharge status: Home / Self Care | Payer: BLUE CROSS/BLUE SHIELD | Source: Ambulatory Visit | Attending: Obstetrics and Gynecology | Admitting: Obstetrics and Gynecology

## 2014-12-18 ENCOUNTER — Inpatient Hospital Stay (HOSPITAL_COMMUNITY): Payer: BLUE CROSS/BLUE SHIELD

## 2014-12-18 ENCOUNTER — Encounter (HOSPITAL_COMMUNITY): Payer: Self-pay | Admitting: *Deleted

## 2014-12-18 DIAGNOSIS — R102 Pelvic and perineal pain: Secondary | ICD-10-CM | POA: Diagnosis present

## 2014-12-18 DIAGNOSIS — O219 Vomiting of pregnancy, unspecified: Secondary | ICD-10-CM

## 2014-12-18 DIAGNOSIS — O26891 Other specified pregnancy related conditions, first trimester: Secondary | ICD-10-CM

## 2014-12-18 DIAGNOSIS — R112 Nausea with vomiting, unspecified: Secondary | ICD-10-CM | POA: Diagnosis present

## 2014-12-18 DIAGNOSIS — Z3A01 Less than 8 weeks gestation of pregnancy: Secondary | ICD-10-CM | POA: Insufficient documentation

## 2014-12-18 LAB — WET PREP, GENITAL
Clue Cells Wet Prep HPF POC: NONE SEEN
TRICH WET PREP: NONE SEEN
YEAST WET PREP: NONE SEEN

## 2014-12-18 LAB — URINALYSIS, ROUTINE W REFLEX MICROSCOPIC
Bilirubin Urine: NEGATIVE
GLUCOSE, UA: NEGATIVE mg/dL
KETONES UR: NEGATIVE mg/dL
Nitrite: NEGATIVE
PROTEIN: NEGATIVE mg/dL
Specific Gravity, Urine: >1.03 — ABNORMAL HIGH (ref 1.005–1.030)
Urobilinogen, UA: 0.2 mg/dL (ref 0.0–1.0)
pH: 5.5 (ref 5.0–8.0)

## 2014-12-18 LAB — CBC
HEMATOCRIT: 36.2 % (ref 36.0–46.0)
Hemoglobin: 12 g/dL (ref 12.0–15.0)
MCH: 27.7 pg (ref 26.0–34.0)
MCHC: 33.1 g/dL (ref 30.0–36.0)
MCV: 83.6 fL (ref 78.0–100.0)
Platelets: 190 10*3/uL (ref 150–400)
RBC: 4.33 MIL/uL (ref 3.87–5.11)
RDW: 13.7 % (ref 11.5–15.5)
WBC: 9.5 10*3/uL (ref 4.0–10.5)

## 2014-12-18 LAB — HCG, QUANTITATIVE, PREGNANCY: HCG, BETA CHAIN, QUANT, S: 21058 m[IU]/mL — AB (ref <5)

## 2014-12-18 LAB — URINE MICROSCOPIC-ADD ON

## 2014-12-18 MED ORDER — PROMETHAZINE HCL 25 MG PO TABS: 12.5000 mg | ORAL_TABLET | Freq: Four times a day (QID) | ORAL | Status: DC | PRN

## 2014-12-18 NOTE — MAU Note (Signed)
Pt presents to MAU with complaints of nausea and vomiting for two weeks and lower abdominal cramping started today.

## 2014-12-18 NOTE — Discharge Instructions (Signed)
Morning Sickness Morning sickness is when you feel sick to your stomach (nauseous) during pregnancy. This nauseous feeling may or may not come with vomiting. It often occurs in the morning but can be a problem any time of day. Morning sickness is most common during the first trimester, but it may continue throughout pregnancy. While morning sickness is unpleasant, it is usually harmless unless you develop severe and continual vomiting (hyperemesis gravidarum). This condition requires more intense treatment.  CAUSES  The cause of morning sickness is not completely known but seems to be related to normal hormonal changes that occur in pregnancy. RISK FACTORS You are at greater risk if you:  Experienced nausea or vomiting before your pregnancy.  Had morning sickness during a previous pregnancy.  Are pregnant with more than one baby, such as twins. TREATMENT  Do not use any medicines (prescription, over-the-counter, or herbal) for morning sickness without first talking to your health care provider. Your health care provider may prescribe or recommend:  Vitamin B6 supplements.  Anti-nausea medicines.  The herbal medicine ginger. HOME CARE INSTRUCTIONS   Only take over-the-counter or prescription medicines as directed by your health care provider.  Taking multivitamins before getting pregnant can prevent or decrease the severity of morning sickness in most women.  Eat a piece of dry toast or unsalted crackers before getting out of bed in the morning.  Eat five or six small meals a day.  Eat dry and bland foods (rice, baked potato). Foods high in carbohydrates are often helpful.  Do not drink liquids with your meals. Drink liquids between meals.  Avoid greasy, fatty, and spicy foods.  Get someone to cook for you if the smell of any food causes nausea and vomiting.  If you feel nauseous after taking prenatal vitamins, take the vitamins at night or with a snack.  Snack on protein  foods (nuts, yogurt, cheese) between meals if you are hungry.  Eat unsweetened gelatins for desserts.  Wearing an acupressure wristband (worn for sea sickness) may be helpful.  Acupuncture may be helpful.  Do not smoke.  Get a humidifier to keep the air in your house free of odors.  Get plenty of fresh air. SEEK MEDICAL CARE IF:   Your home remedies are not working, and you need medicine.  You feel dizzy or lightheaded.  You are losing weight. SEEK IMMEDIATE MEDICAL CARE IF:   You have persistent and uncontrolled nausea and vomiting.  You pass out (faint). MAKE SURE YOU:  Understand these instructions.  Will watch your condition.  Will get help right away if you are not doing well or get worse.   This information is not intended to replace advice given to you by your health care provider. Make sure you discuss any questions you have with your health care provider.   Document Released: 04/22/2006 Document Revised: 03/06/2013 Document Reviewed: 08/16/2012 Elsevier Interactive Patient Education 2016 Elsevier Inc.   Abdominal Pain During Pregnancy Abdominal pain is common in pregnancy. Most of the time, it does not cause harm. There are many causes of abdominal pain. Some causes are more serious than others. Some of the causes of abdominal pain in pregnancy are easily diagnosed. Occasionally, the diagnosis takes time to understand. Other times, the cause is not determined. Abdominal pain can be a sign that something is very wrong with the pregnancy, or the pain may have nothing to do with the pregnancy at all. For this reason, always tell your health care provider if you have  any abdominal discomfort. HOME CARE INSTRUCTIONS  Monitor your abdominal pain for any changes. The following actions may help to alleviate any discomfort you are experiencing:  Do not have sexual intercourse or put anything in your vagina until your symptoms go away completely.  Get plenty of rest  until your pain improves.  Drink clear fluids if you feel nauseous. Avoid solid food as long as you are uncomfortable or nauseous.  Only take over-the-counter or prescription medicine as directed by your health care provider.  Keep all follow-up appointments with your health care provider. SEEK IMMEDIATE MEDICAL CARE IF:  You are bleeding, leaking fluid, or passing tissue from the vagina.  You have increasing pain or cramping.  You have persistent vomiting.  You have painful or bloody urination.  You have a fever.  You notice a decrease in your baby's movements.  You have extreme weakness or feel faint.  You have shortness of breath, with or without abdominal pain.  You develop a severe headache with abdominal pain.  You have abnormal vaginal discharge with abdominal pain.  You have persistent diarrhea.  You have abdominal pain that continues even after rest, or gets worse. MAKE SURE YOU:   Understand these instructions.  Will watch your condition.  Will get help right away if you are not doing well or get worse.   This information is not intended to replace advice given to you by your health care provider. Make sure you discuss any questions you have with your health care provider.   Document Released: 03/01/2005 Document Revised: 12/20/2012 Document Reviewed: 09/28/2012 Elsevier Interactive Patient Education [employer].

## 2014-12-18 NOTE — MAU Provider Note (Signed)
Chief Complaint: Morning Sickness and Abdominal Cramping   None     SUBJECTIVE HPI: Ann Brock is a 29 y.o. [id number] at [redacted]w[redacted]d by LMP who presents to maternity admissions reporting nausea/vomiting and abdominal cramping.  She reports n/v x 2-3 weeks worsening in last 4 days with onset of cramping last 2 days.  She has hx of miscarriage x 2 and ectopic pregnancy x 1 with NSVD and LTCS with her 2 term pregnancies.   She denies vaginal bleeding, vaginal itching/burning, urinary symptoms, h/a, dizziness, n/v, or fever/chills.     Abdominal Cramping This is a new problem. The current episode started in the past 7 days. The onset quality is gradual. The problem occurs intermittently. The problem has been waxing and waning. The pain is located in the LLQ and RLQ. The pain is moderate. The quality of the pain is cramping. The abdominal pain radiates to the back. Associated symptoms include nausea and vomiting. Pertinent negatives include no constipation, diarrhea, dysuria, fever, frequency or headaches. The pain is aggravated by movement. The pain is relieved by nothing. She has tried nothing for the symptoms.    History reviewed. No pertinent past medical history. Past Surgical History  Procedure Laterality Date  . Cholecystectomy    . Cesarean section    . Knee surgery    . Tonsillectomy and adenoidectomy     Social History   Social History  . Marital Status: Married    Spouse Name: N/A  . Number of Children: N/A  . Years of Education: N/A   Occupational History  . Not on file.   Social History Main Topics  . Smoking status: Former Research scientist (life sciences)  . Smokeless tobacco: Not on file  . Alcohol Use: 0.0 oz/week    0 Standard drinks or equivalent per week  . Drug Use: No  . Sexual Activity:    Partners: Female   Other Topics Concern  . Not on file   Social History Narrative   No current facility-administered medications on file prior to encounter.   Current Outpatient Prescriptions  on File Prior to Encounter  Medication Sig Dispense Refill  . Prenatal MV-Min-Fe Fum-FA-DHA (PRENATAL 1 PO) Take 1 tablet by mouth daily.      Allergies  Allergen Reactions  . Aspirin Anaphylaxis  . Cephalosporins Anaphylaxis  . Contrast Media  [Iodinated Diagnostic Agents] Anaphylaxis    ROS:  Review of Systems  Constitutional: Negative for fever, chills and fatigue.  HENT: Negative for sinus pressure.   Eyes: Negative for photophobia.  Respiratory: Negative for shortness of breath.   Cardiovascular: Negative for chest pain.  Gastrointestinal: Positive for nausea and vomiting. Negative for diarrhea and constipation.  Genitourinary: Negative for dysuria, frequency, flank pain, vaginal bleeding, vaginal discharge, difficulty urinating, vaginal pain and pelvic pain.  Musculoskeletal: Negative for neck pain.  Neurological: Negative for dizziness, weakness and headaches.  Psychiatric/Behavioral: Negative.      I have reviewed patient's Past Medical Hx, Surgical Hx, Family Hx, Social Hx, medications and allergies.   Physical Exam   Patient Vitals for the past 24 hrs:  BP Temp Pulse Resp  12/18/14 2041 115/62 mmHg - 87 18  12/18/14 1720 106/78 mmHg 98.2 F (36.8 C) 95 18   Constitutional: Well-developed, well-nourished female in no acute distress.  Cardiovascular: normal rate Respiratory: normal effort GI: Abd soft, non-tender. Pos BS x 4 MS: Extremities nontender, no edema, normal ROM Neurologic: Alert and oriented x 4.  GU: Neg CVAT.  PELVIC EXAM: Cervix pink, visually  closed, without lesion, scant white creamy discharge, vaginal walls and external genitalia normal Bimanual exam: Cervix 0/long/high, firm, anterior, neg CMT, uterus nontender, nonenlarged, adnexa without tenderness, enlargement, or mass   LAB RESULTS Results for orders placed or performed during the hospital encounter of 12/18/14 (from the past 24 hour(s))  Urinalysis, Routine w reflex microscopic (not at  [facility])     Status: Abnormal   Collection Time: 12/18/14  5:34 PM  Result Value Ref Range   Color, Urine YELLOW YELLOW   APPearance CLEAR CLEAR   Specific Gravity, Urine >1.030 (H) 1.005 - 1.030   pH 5.5 5.0 - 8.0   Glucose, UA NEGATIVE NEGATIVE mg/dL   Hgb urine dipstick TRACE (A) NEGATIVE   Bilirubin Urine NEGATIVE NEGATIVE   Ketones, ur NEGATIVE NEGATIVE mg/dL   Protein, ur NEGATIVE NEGATIVE mg/dL   Urobilinogen, UA 0.2 0.0 - 1.0 mg/dL   Nitrite NEGATIVE NEGATIVE   Leukocytes, UA SMALL (A) NEGATIVE  Urine microscopic-add on     Status: Abnormal   Collection Time: 12/18/14  5:34 PM  Result Value Ref Range   Squamous Epithelial / LPF MANY (A) RARE   WBC, UA 7-10 <3 WBC/hpf   RBC / HPF 0-2 <3 RBC/hpf   Bacteria, UA MANY (A) RARE   Urine-Other MUCOUS PRESENT   hCG, quantitative, pregnancy     Status: Abnormal   Collection Time: 12/18/14  5:45 PM  Result Value Ref Range   hCG, Beta Chain, Quant, S 21058 (H) <5 mIU/mL  CBC     Status: None   Collection Time: 12/18/14  6:06 PM  Result Value Ref Range   WBC 9.5 4.0 - 10.5 K/uL   RBC 4.33 3.87 - 5.11 MIL/uL   Hemoglobin 12.0 12.0 - 15.0 g/dL   HCT 36.2 36.0 - 46.0 %   MCV 83.6 78.0 - 100.0 fL   MCH 27.7 26.0 - 34.0 pg   MCHC 33.1 30.0 - 36.0 g/dL   RDW 13.7 11.5 - 15.5 %   Platelets 190 150 - 400 K/uL  Wet prep, genital     Status: Abnormal   Collection Time: 12/18/14  6:18 PM  Result Value Ref Range   Yeast Wet Prep HPF POC NONE SEEN NONE SEEN   Trich, Wet Prep NONE SEEN NONE SEEN   Clue Cells Wet Prep HPF POC NONE SEEN NONE SEEN   WBC, Wet Prep HPF POC MODERATE (A) NONE SEEN       IMAGING US Ob Comp Less 14 Wks  12/18/2014   CLINICAL DATA:  Pelvic pain for 2 weeks  EXAM: OBSTETRIC <14 WK [place] AND TRANSVAGINAL OB US  TECHNIQUE: Both transabdominal and transvaginal ultrasound examinations were performed for complete evaluation of the gestation as well as the maternal uterus, adnexal regions, and pelvic cul-de-sac.  Transvaginal technique was performed to assess early pregnancy.  COMPARISON:  None.  FINDINGS: Intrauterine gestational sac: Present and fundal  Yolk sac:  Present  Embryo:  Present  Cardiac Activity: Present  Heart Rate: 86  bpm  MSD:   mm    w     d  CRL:  2 mm  mm   5 w   5 d                  [place] EDC: 08/15/2015  Maternal uterus/adnexae: Ovaries are within normal limits. No pelvic mass. No free-fluid. A small subchorionic hemorrhage is present measuring 1.8 x 0.6 x 1.9 cm.  IMPRESSION: Single live intrauterine gestation  with an estimated gestational age of [redacted] weeks and 5 days. Fetal heart rate is 86 beats per min. There is a small subchorionic hemorrhage.   Electronically Signed   By: Marybelle Killings M.D.   On: 12/18/2014 19:54   US Ob Transvaginal  12/18/2014   CLINICAL DATA:  Pelvic pain for 2 weeks  EXAM: OBSTETRIC <14 WK [place] AND TRANSVAGINAL OB US  TECHNIQUE: Both transabdominal and transvaginal ultrasound examinations were performed for complete evaluation of the gestation as well as the maternal uterus, adnexal regions, and pelvic cul-de-sac. Transvaginal technique was performed to assess early pregnancy.  COMPARISON:  None.  FINDINGS: Intrauterine gestational sac: Present and fundal  Yolk sac:  Present  Embryo:  Present  Cardiac Activity: Present  Heart Rate: 86  bpm  MSD:   mm    w     d  CRL:  2 mm  mm   5 w   5 d                  [place] EDC: 08/15/2015  Maternal uterus/adnexae: Ovaries are within normal limits. No pelvic mass. No free-fluid. A small subchorionic hemorrhage is present measuring 1.8 x 0.6 x 1.9 cm.  IMPRESSION: Single live intrauterine gestation with an estimated gestational age of [redacted] weeks and 5 days. Fetal heart rate is 86 beats per min. There is a small subchorionic hemorrhage.   Electronically Signed   By: Marybelle Killings M.D.   On: 12/18/2014 19:54    MAU Management/MDM: Ordered labs and imaging and reviewed results. [place] confirms IUP.  Pt stable at time of discharge.  ASSESSMENT 1. Nausea  and vomiting during pregnancy prior to [redacted] weeks gestation   2. Pelvic pain affecting pregnancy in first trimester, antepartum     PLAN Discharge home Phenergan 12.5-25 mg PO Q 6 hours PRN, pt given list of safe OTC meds in pregnancy F/U with [facility] office as planned for prenatal care   Follow-up Information    Please follow up.   Why:  Start prenatal care as soon as possible      Follow up with [place].   Why:  As needed for emergencies   Contact information:   [address] [phone] [place] Certified Nurse-Midwife 12/18/2014  8:59 PM

## 2014-12-19 ENCOUNTER — Ambulatory Visit (INDEPENDENT_AMBULATORY_CARE_PROVIDER_SITE_OTHER): Payer: Self-pay | Admitting: Physical Therapy

## 2014-12-19 DIAGNOSIS — Z7409 Other reduced mobility: Secondary | ICD-10-CM

## 2014-12-19 DIAGNOSIS — R293 Abnormal posture: Secondary | ICD-10-CM

## 2014-12-19 DIAGNOSIS — M542 Cervicalgia: Secondary | ICD-10-CM

## 2014-12-19 DIAGNOSIS — M436 Torticollis: Secondary | ICD-10-CM

## 2014-12-19 LAB — GC/CHLAMYDIA PROBE AMP (~~LOC~~) NOT AT ARMC
Chlamydia: NEGATIVE
Neisseria Gonorrhea: NEGATIVE

## 2014-12-19 LAB — HIV ANTIBODY (ROUTINE TESTING W REFLEX): HIV SCREEN 4TH GENERATION: NONREACTIVE

## 2014-12-19 NOTE — Therapy (Signed)
[place], [place], [address] Phone: [phone]   Fax:  [phone]  Physical Therapy Treatment  Patient Details  Name: Uri Covey MRN: [id number] Date of Birth: January 26, 1986 Referring Provider:  Gregor Hams, MD  Encounter Date: 12/19/2014      PT End of Session - 12/19/14 0856    Visit Number 6   Number of Visits 12   Date for PT Re-Evaluation 12/30/14   PT Start Time 1761   PT Stop Time 0947   PT Time Calculation (min) 56 min      No past medical history on file.  Past Surgical History  Procedure Laterality Date  . Cholecystectomy    . Cesarean section    . Knee surgery    . Tonsillectomy and adenoidectomy      There were no vitals filed for this visit.  Visit Diagnosis:  Neck pain  Abnormal posture  Stiffness of neck  Impaired mobility and endurance      Subjective Assessment - 12/19/14 0856    Subjective Pt reports her Lt neck feeling better, Rt side of neck feels worse.  Pt was in ER last night for dehydration and cramping; resolved last night and baby ok.    Currently in Pain? Yes   Pain Score 4    Pain Descriptors / Indicators Aching           [facility] Adult PT Treatment/Exercise - 12/19/14 0001    Neck Exercises: Seated   Upper Extremity D2 Flexion;15 reps  2 sets   Theraband Level (UE D2) Level 1 (Yellow)   Other Seated Exercise --  Scap squeeze 5 sec hold x 10 reps    Shoulder Exercises: Seated   Row 10 reps;Strengthening;Both;Theraband  2 sets   Theraband Level (Shoulder Row) Level 2 (Red)   Other Seated Exercises Wood chop with yellow band x 10 reps each side, 2 sets   Shoulder Exercises: Standing   Other Standing Exercises shoulder depression with red band (bilat) x 10 reps UBE level 2:  2 min each direction.     Moist Heat Therapy   Number Minutes Moist Heat 15 Minutes   Moist Heat Location Cervical   Electrical Stimulation   Electrical  Stimulation Location bilat mid/lower cervical paraspinals   Electrical Stimulation Action IFC   Electrical Stimulation Parameters to tolerance    Electrical Stimulation Goals Pain   Manual Therapy   Soft tissue mobilization to Rt and Lt upper traps, cervical and upper thoracic paraspinals with Edge tool assistance, to decrease fascial restrictions and pain.    Neck Exercises: Stretches   Upper Trapezius Stretch 20 seconds;2 reps   Levator Stretch 2 reps;20 seconds            PT Long Term Goals - 12/02/14 1250    PT LONG TERM GOAL #1   Title Pt I in HEP for discharge 12/30/14   Status On-going   PT LONG TERM GOAL #2   Title Cervical ROM WNL's and painfree 12/30/14   Status On-going   PT LONG TERM GOAL #3   Title Patient reports ability to turn head to check traffic when driving 60/73/71   Status On-going   PT LONG TERM GOAL #4   Title Decrease FOTO to </= 38% limitation 12/30/14   Status On-going               Plan - 12/19/14 1049    Clinical Impression Statement Pt tolerated all  exercises with increased repetitions without increase in pain; these exercises issued for HEP.  Pt reported decrease in pain in neck pain with exercise, further reduction with manual therapy and estim.   Progressing towards goals.    Pt will benefit from skilled therapeutic intervention in order to improve on the following deficits Decreased range of motion;Decreased mobility;Pain;Increased fascial restricitons;Decreased activity tolerance;Decreased endurance   Rehab Potential Good   PT Frequency 2x / week   PT Duration 6 weeks   PT Treatment/Interventions Patient/family education;ADLs/Self Care Home Management;Therapeutic exercise;Therapeutic activities;Manual techniques;Cryotherapy;Electrical Stimulation;Moist Heat;Ultrasound;Traction;Dry needling   PT Next Visit Plan Continue progressive postural work/stretching and strengthening of neck. Manual work and modalities as needed.    Consulted and  Agree with Plan of Care Patient        Problem List Patient Active Problem List   Diagnosis Date Noted  . UTI (urinary tract infection) 12/05/2014  . Normal pregnancy 12/04/2014  . History of ectopic pregnancy 12/04/2014  . Neck pain 11/14/2014  . Elevated blood pressure 11/14/2014   Kerin Perna, PTA 12/19/2014 10:57 AM  [facility] [place], [place], [address] Phone: [phone]   Fax:  [phone]

## 2014-12-20 LAB — CULTURE, OB URINE

## 2014-12-23 ENCOUNTER — Ambulatory Visit (INDEPENDENT_AMBULATORY_CARE_PROVIDER_SITE_OTHER): Payer: Self-pay | Admitting: Physical Therapy

## 2014-12-23 DIAGNOSIS — M436 Torticollis: Secondary | ICD-10-CM

## 2014-12-23 DIAGNOSIS — R293 Abnormal posture: Secondary | ICD-10-CM

## 2014-12-23 DIAGNOSIS — Z7409 Other reduced mobility: Secondary | ICD-10-CM

## 2014-12-23 DIAGNOSIS — M542 Cervicalgia: Secondary | ICD-10-CM

## 2014-12-23 NOTE — Therapy (Addendum)
[place], [place], [address] Phone: [phone]   Fax:  [phone]  Physical Therapy Treatment  Patient Details  Name: Ann Brock MRN: [id number] Date of Birth: 1985-12-26 Referring Provider:  Gregor Hams, MD  Encounter Date: 12/23/2014      PT End of Session - 12/23/14 0858    Visit Number 7   Number of Visits 12   Date for PT Re-Evaluation 12/30/14   PT Start Time 0849   PT Stop Time 0930   PT Time Calculation (min) 41 min   Activity Tolerance Patient tolerated treatment well;No increased pain      No past medical history on file.  Past Surgical History  Procedure Laterality Date  . Cholecystectomy    . Cesarean section    . Knee surgery    . Tonsillectomy and adenoidectomy      There were no vitals filed for this visit.  Visit Diagnosis:  Neck pain  Abnormal posture  Stiffness of neck  Impaired mobility and endurance      Subjective Assessment - 12/23/14 0854    Subjective Pt reports she and her family had flu over weekend.  Otherwise, things are going well and exercises are getting easier.    Currently in Pain? Yes   Pain Score 1    Pain Location Neck   Pain Orientation Lower   Pain Descriptors / Indicators Dull   Aggravating Factors  Looking up/down   Pain Relieving Factors heat, ice, TENS            [facility] PT Assessment - 12/23/14 0001    Assessment   Medical Diagnosis neck pain    Onset Date/Surgical Date 10/18/14   Hand Dominance Right   Next MD Visit PRN   AROM   Cervical Flexion 34   Cervical Extension 43  with pain   Cervical - Right Side Bend 32  with pain    Cervical - Left Side Bend 42   Cervical - Right Rotation 60   Cervical - Left Rotation 60           [facility] Adult PT Treatment/Exercise - 12/23/14 0001    Neck Exercises: Machines for Strengthening   UBE (Upper Arm Bike) L2 x 4.5' alt FWB/BWD  standing   Shoulder Exercises:  Seated   Row Strengthening;Both;Theraband  8 reps, 2 sets   Theraband Level (Shoulder Row) Level 3 (Green)   Other Seated Exercises Wood chop with red band x 10 reps each side; repeated with yellow (pt reported increased "cramping" in neck with red band    Other Seated Exercises D2 flexion with red band x 8 reps x 2 sets each arm.    Shoulder Exercises: Standing   Other Standing Exercises shoulder shrugs with green band x 10 reps    Shoulder Exercises: Stretch   Corner Stretch Limitations doorway stretch 3 positions (unilateral)30 sec  (set performed at beginning and end of session)   Modalities   Modalities --  Pt declined due to decreased pain level.    Neck Exercises: Stretches   Upper Trapezius Stretch 20 seconds;4 reps  (beginning and end of session)   Levator Stretch 20 seconds;4 reps  (2 reps at beginning and end of session)                     PT Long Term Goals - 12/23/14 0927    PT LONG TERM GOAL #1   Title Pt  I in HEP for discharge 12/30/14   Time 6   Period Weeks   PT LONG TERM GOAL #2   Title Cervical ROM WNL's and painfree 12/30/14   Time 6   Period Weeks   Status Partially Met   PT LONG TERM GOAL #3   Title Patient reports ability to turn head to check traffic when driving 78/24/23   Time 6   Period Weeks   Status Achieved   PT LONG TERM GOAL #4   Title Decrease FOTO to </= 38% limitation 12/30/14   Time 6   Period Weeks   Status On-going      Pt educated on supine to/from sit via log roll. Pt able to return demo x 2.   Pt to perform seated shoulder sash exercise with red band, decreased reps.          Plan - 12/23/14 0926    Clinical Impression Statement Pt tolerated increased resistance with minimal increase in pain; pain then reduced with stretches.  Pt declined modalities; will perform at home if needed.  Pt demo improved cervical ROM; has met LTG # 3   Pt will benefit from skilled therapeutic intervention in order to improve on  the following deficits Decreased range of motion;Decreased mobility;Pain;Increased fascial restricitons;Decreased activity tolerance;Decreased endurance   Rehab Potential Good   PT Frequency 2x / week   PT Duration 6 weeks   PT Treatment/Interventions Patient/family education;ADLs/Self Care Home Management;Therapeutic exercise;Therapeutic activities;Manual techniques;Cryotherapy;Electrical Stimulation;Moist Heat;Ultrasound;Traction;Dry needling   PT Next Visit Plan Continue progressive postural work/stretching and strengthening of neck. Manual work and modalities as needed.    Consulted and Agree with Plan of Care Patient        Problem List Patient Active Problem List   Diagnosis Date Noted  . UTI (urinary tract infection) 12/05/2014  . Normal pregnancy 12/04/2014  . History of ectopic pregnancy 12/04/2014  . Neck pain 11/14/2014  . Elevated blood pressure 11/14/2014    Kerin Perna, PTA 12/23/2014 9:31 AM  [facility] [place], [place], [address] Phone: [phone]   Fax:  [phone]     PHYSICAL THERAPY DISCHARGE SUMMARY  Visits from Start of Care: 7  Current functional level related to goals / functional outcomes: Improved function - progressing with exercises and functional activities   Remaining deficits: Continues to have some pain    Education / Equipment: HEP  Plan: Patient agrees to discharge.  Patient goals were partially met. Patient is being discharged due to not returning since the last visit.  ?????    Ann Brock PT, MPH 01/21/2015 8:12 AM

## 2014-12-24 ENCOUNTER — Inpatient Hospital Stay (HOSPITAL_COMMUNITY)
Admission: AD | Admit: 2014-12-24 | Discharge: 2014-12-24 | Disposition: A | Discharge status: Home / Self Care | Payer: BLUE CROSS/BLUE SHIELD | Source: Ambulatory Visit | Attending: Family Medicine | Admitting: Family Medicine

## 2014-12-24 ENCOUNTER — Encounter (HOSPITAL_COMMUNITY): Payer: Self-pay | Admitting: *Deleted

## 2014-12-24 DIAGNOSIS — Z87891 Personal history of nicotine dependence: Secondary | ICD-10-CM | POA: Diagnosis not present

## 2014-12-24 DIAGNOSIS — O99611 Diseases of the digestive system complicating pregnancy, first trimester: Secondary | ICD-10-CM | POA: Insufficient documentation

## 2014-12-24 DIAGNOSIS — O219 Vomiting of pregnancy, unspecified: Secondary | ICD-10-CM | POA: Diagnosis not present

## 2014-12-24 DIAGNOSIS — K529 Noninfective gastroenteritis and colitis, unspecified: Secondary | ICD-10-CM | POA: Diagnosis not present

## 2014-12-24 DIAGNOSIS — K920 Hematemesis: Secondary | ICD-10-CM | POA: Diagnosis present

## 2014-12-24 DIAGNOSIS — Z3A01 Less than 8 weeks gestation of pregnancy: Secondary | ICD-10-CM | POA: Diagnosis not present

## 2014-12-24 LAB — CBC
HEMATOCRIT: 43 % (ref 36.0–46.0)
HEMOGLOBIN: 14.1 g/dL (ref 12.0–15.0)
MCH: 28.1 pg (ref 26.0–34.0)
MCHC: 32.8 g/dL (ref 30.0–36.0)
MCV: 85.7 fL (ref 78.0–100.0)
Platelets: 184 10*3/uL (ref 150–400)
RBC: 5.02 MIL/uL (ref 3.87–5.11)
RDW: 13.7 % (ref 11.5–15.5)
WBC: 8.8 10*3/uL (ref 4.0–10.5)

## 2014-12-24 LAB — COMPREHENSIVE METABOLIC PANEL
ALBUMIN: 3.6 g/dL (ref 3.5–5.0)
ALK PHOS: 72 U/L (ref 38–126)
ALT: 33 U/L (ref 14–54)
AST: 24 U/L (ref 15–41)
Anion gap: 8 (ref 5–15)
BILIRUBIN TOTAL: 0.7 mg/dL (ref 0.3–1.2)
BUN: 8 mg/dL (ref 6–20)
CO2: 22 mmol/L (ref 22–32)
Calcium: 8.7 mg/dL — ABNORMAL LOW (ref 8.9–10.3)
Chloride: 104 mmol/L (ref 101–111)
Creatinine, Ser: 0.6 mg/dL (ref 0.44–1.00)
GFR calc Af Amer: >60 mL/min (ref >60)
GFR calc non Af Amer: >60 mL/min (ref >60)
GLUCOSE: 81 mg/dL (ref 65–99)
POTASSIUM: 3.5 mmol/L (ref 3.5–5.1)
Sodium: 134 mmol/L — ABNORMAL LOW (ref 135–145)
TOTAL PROTEIN: 7.1 g/dL (ref 6.5–8.1)

## 2014-12-24 LAB — URINALYSIS, ROUTINE W REFLEX MICROSCOPIC
Glucose, UA: NEGATIVE mg/dL
Hgb urine dipstick: NEGATIVE
Ketones, ur: NEGATIVE mg/dL
LEUKOCYTES UA: NEGATIVE
NITRITE: NEGATIVE
PROTEIN: NEGATIVE mg/dL
Specific Gravity, Urine: >1.03 — ABNORMAL HIGH (ref 1.005–1.030)
Urobilinogen, UA: 0.2 mg/dL (ref 0.0–1.0)
pH: 5.5 (ref 5.0–8.0)

## 2014-12-24 MED ORDER — LACTATED RINGERS IV BOLUS (SEPSIS)
1000.0000 mL | Freq: Once | INTRAVENOUS | Status: AC
  Administered 2014-12-24: 1000 mL via INTRAVENOUS

## 2014-12-24 MED ORDER — DIPHENOXYLATE-ATROPINE 2.5-0.025 MG PO TABS
2.0000 | ORAL_TABLET | Freq: Once | ORAL | Status: AC
  Administered 2014-12-24: 2 via ORAL
  Filled 2014-12-24: qty 2

## 2014-12-24 MED ORDER — PROMETHAZINE HCL 25 MG/ML IJ SOLN
25.0000 mg | Freq: Once | INTRAVENOUS | Status: AC
  Administered 2014-12-24: 25 mg via INTRAVENOUS
  Filled 2014-12-24: qty 1

## 2014-12-24 MED ORDER — SODIUM CHLORIDE 0.9 % IV SOLN: 25.0000 mg | Freq: Once | INTRAVENOUS | Status: DC

## 2014-12-24 MED ORDER — DIPHENOXYLATE-ATROPINE 2.5-0.025 MG PO TABS: 2.0000 | ORAL_TABLET | Freq: Four times a day (QID) | ORAL | Status: DC | PRN

## 2014-12-24 MED ORDER — PROMETHAZINE HCL 25 MG RE SUPP: 25.0000 mg | Freq: Four times a day (QID) | RECTAL | Status: DC | PRN

## 2014-12-24 NOTE — MAU Provider Note (Signed)
Chief Complaint: Hematemesis; Diarrhea; and Dizziness  First Provider Initiated Contact with Patient 12/24/14 1729     SUBJECTIVE HPI: Ann Brock is a 29 y.o. [id number] at [redacted]w[redacted]d who presents to [facility] reporting worsening nausea and vomiting and new onset of watery diarrhea 2 days ago. Saw streaks of blood in vomit today.   Children had vomiting and diarrhea last week. Only recent ABX was Macrobid. No recent hospitalizations.   Frequency: Vomiting 8-10 x per days. Diarrhea ~3 x per hour Duration: 2 weeks Course: Worsening over the past two days Modifying factors: Worse w/ eating Associated signs and symptoms: Pos for sick contacts, dizziness, light-headedness, hematemesis and one episode of blood w/ stool which seemed to be from hemorrhoids. Neg for fever, chills, abd pain.  History reviewed. No pertinent past medical history. OB History  Gravida Para Term Preterm AB SAB TAB Ectopic Multiple Living  5 2 2  2 1  1  2     # Outcome Date GA Lbr Len/2nd Weight Sex Delivery Anes PTL Lv  5 Current           4 Term 04/20/11    Tressie Stalker  3 Term 12/21/07    [address]arlynn [address]   Y  2 SAB           1 Ectopic              Past Surgical History  Procedure Laterality Date  . Cholecystectomy    . Cesarean section    . Knee surgery    . Tonsillectomy and adenoidectomy     Social History   Social History  . Marital Status: Married    Spouse Name: N/A  . Number of Children: N/A  . Years of Education: N/A   Occupational History  . Not on file.   Social History Main Topics  . Smoking status: Former Research scientist (life sciences)  . Smokeless tobacco: Not on file  . Alcohol Use: 0.0 oz/week    0 Standard drinks or equivalent per week  . Drug Use: No  . Sexual Activity:    Partners: Female   Other Topics Concern  . Not on file   Social History Narrative   No current facility-administered medications on file prior to encounter.   Current Outpatient Prescriptions on File Prior to  Encounter  Medication Sig Dispense Refill  . acetaminophen (TYLENOL) 325 MG tablet Take 325 mg by mouth every 6 (six) hours as needed for headache.    . cetirizine (ZYRTEC) 10 MG tablet Take 10 mg by mouth daily as needed for allergies.    . Prenatal MV-Min-Fe Fum-FA-DHA (PRENATAL 1 PO) Take 1 tablet by mouth daily.     . promethazine (PHENERGAN) 25 MG tablet Take 0.5-1 tablets (12.5-25 mg total) by mouth every 6 (six) hours as needed. 30 tablet 2   Allergies  Allergen Reactions  . Aspirin Anaphylaxis  . Cephalosporins Anaphylaxis  . Contrast Media  [Iodinated Diagnostic Agents] Anaphylaxis    I have reviewed the past Medical Hx, Surgical Hx, Social Hx, Allergies and Medications.   Review of Systems  Constitutional: Negative for fever and chills.  Gastrointestinal: Positive for nausea, vomiting, diarrhea and blood in stool. Negative for abdominal pain, constipation and rectal pain.  Genitourinary: Negative for vaginal bleeding.  Neurological: Positive for dizziness, weakness and light-headedness.    OBJECTIVE Patient Vitals for the past 24 hrs:  BP Temp Temp src Pulse Resp SpO2 Weight  12/24/14 2043 114/61 mmHg - - 82 - - -  12/24/14 1618 114/82 mmHg 98.4 F (36.9 C) Oral 88 18 100 % 262 lb 3.2 oz (118.933 kg)  12/24/14 1617 - - - - - 100 % -   Constitutional: Well-developed, well-nourished female in no acute distress.  Head: Mucus membranes moist Cardiovascular: normal rate Respiratory: normal rate and effort.  GI: Abd soft, non-tender. Neurologic: Alert and oriented x 4.  GU: Neg CVAT.   LAB RESULTS Results for orders placed or performed during the hospital encounter of 12/24/14 (from the past 24 hour(s))  Urinalysis, Routine w reflex microscopic (not at [facility])     Status: Abnormal   Collection Time: 12/24/14  4:25 PM  Result Value Ref Range   Color, Urine AMBER (A) YELLOW   APPearance CLEAR CLEAR   Specific Gravity, Urine >1.030 (H) 1.005 - 1.030   pH 5.5 5.0 - 8.0    Glucose, UA NEGATIVE NEGATIVE mg/dL   Hgb urine dipstick NEGATIVE NEGATIVE   Bilirubin Urine SMALL (A) NEGATIVE   Ketones, ur NEGATIVE NEGATIVE mg/dL   Protein, ur NEGATIVE NEGATIVE mg/dL   Urobilinogen, UA 0.2 0.0 - 1.0 mg/dL   Nitrite NEGATIVE NEGATIVE   Leukocytes, UA NEGATIVE NEGATIVE  CBC     Status: None   Collection Time: 12/24/14  5:35 PM  Result Value Ref Range   WBC 8.8 4.0 - 10.5 K/uL   RBC 5.02 3.87 - 5.11 MIL/uL   Hemoglobin 14.1 12.0 - 15.0 g/dL   HCT 43.0 36.0 - 46.0 %   MCV 85.7 78.0 - 100.0 fL   MCH 28.1 26.0 - 34.0 pg   MCHC 32.8 30.0 - 36.0 g/dL   RDW 13.7 11.5 - 15.5 %   Platelets 184 150 - 400 K/uL  Comprehensive metabolic panel     Status: Abnormal   Collection Time: 12/24/14  5:35 PM  Result Value Ref Range   Sodium 134 (L) 135 - 145 mmol/L   Potassium 3.5 3.5 - 5.1 mmol/L   Chloride 104 101 - 111 mmol/L   CO2 22 22 - 32 mmol/L   Glucose, Bld 81 65 - 99 mg/dL   BUN 8 6 - 20 mg/dL   Creatinine, Ser 0.60 0.44 - 1.00 mg/dL   Calcium 8.7 (L) 8.9 - 10.3 mg/dL   Total Protein 7.1 6.5 - 8.1 g/dL   Albumin 3.6 3.5 - 5.0 g/dL   AST 24 15 - 41 U/L   ALT 33 14 - 54 U/L   Alkaline Phosphatase 72 38 - 126 U/L   Total Bilirubin 0.7 0.3 - 1.2 mg/dL   GFR calc non Af Amer >60 >60 mL/min   GFR calc Af Amer >60 >60 mL/min   Anion gap 8 5 - 15    IMAGING Na  MAU COURSE UA, CBC, CMET, Phenergan IV, LR bolus, Lomotil.   N/V resolved. One episode diarrhea. None since Lomotil dose.  MDM 29 year-old female at [redacted] weeks gestational w/ known N/V of pregnancy w/ what appears to be infections gastroenteritis. C. Dif considered, but more likely that it is viral since family all had it last week and have recovered.    ASSESSMENT 1. Nausea and vomiting of pregnancy, antepartum   2. Gastroenteritis, infectious, presumed     PLAN Discharge home in stable condition. Will need C. Dif PCR if diarrhea does not resolve in 48-72 hours. Clear liquids x 24 hours, than advance  slowly.      Follow-up Information    Follow up with [facility] [place].  Specialty:  Obstetrics and Gynecology   Why:  Start prenatal care   Contact information:   [place], [place] [phone]      Follow up with [facility].   Why:  As needed if symptoms worsen   Contact information:   [address] [id number] [place] [phone]       Medication List    TAKE these medications        acetaminophen 325 MG tablet  Commonly known as:  TYLENOL  Take 325 mg by mouth every 6 (six) hours as needed for headache.     cetirizine 10 MG tablet  Commonly known as:  ZYRTEC  Take 10 mg by mouth daily as needed for allergies.     diphenoxylate-atropine 2.5-0.025 MG tablet  Commonly known as:  LOMOTIL  Take 2 tablets by mouth 4 (four) times daily as needed for diarrhea or loose stools.     PRENATAL 1 PO  Take 1 tablet by mouth daily.     promethazine 25 MG tablet  Commonly known as:  PHENERGAN  Take 0.5-1 tablets (12.5-25 mg total) by mouth every 6 (six) hours as needed.     promethazine 25 MG suppository  Commonly known as:  PHENERGAN  Place 1 suppository (25 mg total) rectally every 6 (six) hours as needed for nausea. May be used vaginally       [place], CNM 12/24/2014  10:26 PM

## 2014-12-24 NOTE — MAU Note (Signed)
Has been having ongoing vomiting- taking meds, don't seem to be working, noted blood in emesis a few hours ago.  Started having diarrhea 2 days ago; her 2 kids had vomiting and diarrhea last wk. Has been dizzy and lightheaded.

## 2014-12-24 NOTE — Discharge Instructions (Signed)
Viral Gastroenteritis °Viral gastroenteritis is also known as stomach flu. This condition affects the stomach and intestinal tract. It can cause sudden diarrhea and vomiting. The illness typically lasts 3 to 8 days. Most people develop an immune response that eventually gets rid of the virus. While this natural response develops, the virus can make you quite ill. °CAUSES  °Many different viruses can cause gastroenteritis, such as rotavirus or noroviruses. You can catch one of these viruses by consuming contaminated food or water. You may also catch a virus by sharing utensils or other personal items with an infected person or by touching a contaminated surface. °SYMPTOMS  °The most common symptoms are diarrhea and vomiting. These problems can cause a severe loss of body fluids (dehydration) and a body salt (electrolyte) imbalance. Other symptoms may include: °· Fever. °· Headache. °· Fatigue. °· Abdominal pain. °DIAGNOSIS  °Your caregiver can usually diagnose viral gastroenteritis based on your symptoms and a physical exam. A stool sample may also be taken to test for the presence of viruses or other infections. °TREATMENT  °This illness typically goes away on its own. Treatments are aimed at rehydration. The most serious cases of viral gastroenteritis involve vomiting so severely that you are not able to keep fluids down. In these cases, fluids must be given through an intravenous line (IV). °HOME CARE INSTRUCTIONS  °· Drink enough fluids to keep your urine clear or pale yellow. Drink small amounts of fluids frequently and increase the amounts as tolerated. °· Ask your caregiver for specific rehydration instructions. °· Avoid: °¨ Foods high in sugar. °¨ Alcohol. °¨ Carbonated drinks. °¨ Tobacco. °¨ Juice. °¨ Caffeine drinks. °¨ Extremely hot or cold fluids. °¨ Fatty, greasy foods. °¨ Too much intake of anything at one time. °¨ Dairy products until 24 to 48 hours after diarrhea stops. °· You may consume probiotics.  Probiotics are active cultures of beneficial bacteria. They may lessen the amount and number of diarrheal stools in adults. Probiotics can be found in yogurt with active cultures and in supplements. °· Wash your hands well to avoid spreading the virus. °· Only take over-the-counter or prescription medicines for pain, discomfort, or fever as directed by your caregiver. Do not give aspirin to children. Antidiarrheal medicines are not recommended. °· Ask your caregiver if you should continue to take your regular prescribed and over-the-counter medicines. °· Keep all follow-up appointments as directed by your caregiver. °SEEK IMMEDIATE MEDICAL CARE IF:  °· You are unable to keep fluids down. °· You do not urinate at least once every 6 to 8 hours. °· You develop shortness of breath. °· You notice blood in your stool or vomit. This may look like coffee grounds. °· You have abdominal pain that increases or is concentrated in one small area (localized). °· You have persistent vomiting or diarrhea. °· You have a fever. °· The patient is a child younger than 3 months, and he or she has a fever. °· The patient is a child older than 3 months, and he or she has a fever and persistent symptoms. °· The patient is a child older than 3 months, and he or she has a fever and symptoms suddenly get worse. °· The patient is a baby, and he or she has no tears when crying. °MAKE SURE YOU:  °· Understand these instructions. °· Will watch your condition. °· Will get help right away if you are not doing well or get worse. °  °This information is not intended to replace   advice given to you by your health care provider. Make sure you discuss any questions you have with your health care provider.   Document Released: 03/01/2005 Document Revised: 05/24/2011 Document Reviewed: 12/16/2010 Elsevier Interactive Patient Education 2016 [employer].   Diarrhea Diarrhea is frequent loose and watery bowel movements. It can cause you to feel weak  and dehydrated. Dehydration can cause you to become tired and thirsty, have a dry mouth, and have decreased urination that often is dark yellow. Diarrhea is a sign of another problem, most often an infection that will not last long. In most cases, diarrhea typically lasts 2-3 days. However, it can last longer if it is a sign of something more serious. It is important to treat your diarrhea as directed by your caregiver to lessen or prevent future episodes of diarrhea. CAUSES  Some common causes include:  Gastrointestinal infections caused by viruses, bacteria, or parasites.  Food poisoning or food allergies.  Certain medicines, such as antibiotics, chemotherapy, and laxatives.  Artificial sweeteners and fructose.  Digestive disorders. HOME CARE INSTRUCTIONS  Ensure adequate fluid intake (hydration): Have 1 cup (8 oz) of fluid for each diarrhea episode. Avoid fluids that contain simple sugars or sports drinks, fruit juices, whole milk products, and sodas. Your urine should be clear or pale yellow if you are drinking enough fluids. Hydrate with an oral rehydration solution that you can purchase at pharmacies, retail stores, and online. You can prepare an oral rehydration solution at home by mixing the following ingredients together:   - tsp table salt.   tsp baking soda.   tsp salt substitute containing potassium chloride.  1  tablespoons sugar.  1 L (34 oz) of water.  Certain foods and beverages may increase the speed at which food moves through the gastrointestinal (GI) tract. These foods and beverages should be avoided and include:  Caffeinated and alcoholic beverages.  High-fiber foods, such as raw fruits and vegetables, nuts, seeds, and whole grain breads and cereals.  Foods and beverages sweetened with sugar alcohols, such as xylitol, sorbitol, and mannitol.  Some foods may be well tolerated and may help thicken stool including:  Starchy foods, such as rice, toast, pasta,  low-sugar cereal, oatmeal, grits, baked potatoes, crackers, and bagels.  Bananas.  Applesauce.  Add probiotic-rich foods to help increase healthy bacteria in the GI tract, such as yogurt and fermented milk products.  Wash your hands well after each diarrhea episode.  Only take over-the-counter or prescription medicines as directed by your caregiver.  Take a warm bath to relieve any burning or pain from frequent diarrhea episodes. SEEK IMMEDIATE MEDICAL CARE IF:   You are unable to keep fluids down.  You have persistent vomiting.  You have blood in your stool, or your stools are black and tarry.  You do not urinate in 6-8 hours, or there is only a small amount of very dark urine.  You have abdominal pain that increases or localizes.  You have weakness, dizziness, confusion, or light-headedness.  You have a severe headache.  Your diarrhea gets worse or does not get better.  You have a fever or persistent symptoms for more than 2-3 days.  You have a fever and your symptoms suddenly get worse. MAKE SURE YOU:   Understand these instructions.  Will watch your condition.  Will get help right away if you are not doing well or get worse.   This information is not intended to replace advice given to you by your health care provider.  Make sure you discuss any questions you have with your health care provider.   Document Released: 02/19/2002 Document Revised: 03/22/2014 Document Reviewed: 11/07/2011 Elsevier Interactive Patient Education [employer].

## 2014-12-25 ENCOUNTER — Encounter: Payer: BLUE CROSS/BLUE SHIELD | Admitting: Obstetrics & Gynecology

## 2014-12-26 ENCOUNTER — Encounter: Payer: BLUE CROSS/BLUE SHIELD | Admitting: Physical Therapy

## 2015-01-01 ENCOUNTER — Encounter: Payer: Self-pay | Admitting: Family

## 2015-01-03 ENCOUNTER — Encounter: Payer: Self-pay | Admitting: Family

## 2015-01-03 ENCOUNTER — Ambulatory Visit (INDEPENDENT_AMBULATORY_CARE_PROVIDER_SITE_OTHER): Payer: BLUE CROSS/BLUE SHIELD | Admitting: Family

## 2015-01-03 VITALS — BP 119/78 | HR 75 | Wt 256.0 lb

## 2015-01-03 DIAGNOSIS — Z3481 Encounter for supervision of other normal pregnancy, first trimester: Secondary | ICD-10-CM

## 2015-01-03 DIAGNOSIS — Z113 Encounter for screening for infections with a predominantly sexual mode of transmission: Secondary | ICD-10-CM | POA: Diagnosis not present

## 2015-01-03 DIAGNOSIS — Z124 Encounter for screening for malignant neoplasm of cervix: Secondary | ICD-10-CM | POA: Diagnosis not present

## 2015-01-03 DIAGNOSIS — O34219 Maternal care for unspecified type scar from previous cesarean delivery: Secondary | ICD-10-CM

## 2015-01-03 DIAGNOSIS — O0990 Supervision of high risk pregnancy, unspecified, unspecified trimester: Secondary | ICD-10-CM | POA: Insufficient documentation

## 2015-01-03 DIAGNOSIS — Z3491 Encounter for supervision of normal pregnancy, unspecified, first trimester: Secondary | ICD-10-CM

## 2015-01-03 DIAGNOSIS — O10911 Unspecified pre-existing hypertension complicating pregnancy, first trimester: Secondary | ICD-10-CM

## 2015-01-03 DIAGNOSIS — O10919 Unspecified pre-existing hypertension complicating pregnancy, unspecified trimester: Secondary | ICD-10-CM | POA: Insufficient documentation

## 2015-01-03 MED ORDER — ONDANSETRON 4 MG PO TBDP: 4.0000 mg | ORAL_TABLET | Freq: Four times a day (QID) | ORAL | Status: DC | PRN

## 2015-01-03 NOTE — Patient Instructions (Addendum)
First Trimester of Pregnancy The first trimester of pregnancy is from week 1 until the end of week 12 (months 1 through 3). A week after a sperm fertilizes an egg, the egg will implant on the wall of the uterus. This embryo will begin to develop into a baby. Genes from you and your partner are forming the baby. The female genes determine whether the baby is a boy or a girl. At 6-8 weeks, the eyes and face are formed, and the heartbeat can be seen on ultrasound. At the end of 12 weeks, all the baby's organs are formed.  Now that you are pregnant, you will want to do everything you can to have a healthy baby. Two of the most important things are to get good prenatal care and to follow your health care provider's instructions. Prenatal care is all the medical care you receive before the baby's birth. This care will help prevent, find, and treat any problems during the pregnancy and childbirth. BODY CHANGES Your body goes through many changes during pregnancy. The changes vary from woman to woman.   You may gain or lose a couple of pounds at first.  You may feel sick to your stomach (nauseous) and throw up (vomit). If the vomiting is uncontrollable, call your health care provider.  You may tire easily.  You may develop headaches that can be relieved by medicines approved by your health care provider.  You may urinate more often. Painful urination may mean you have a bladder infection.  You may develop heartburn as a result of your pregnancy.  You may develop constipation because certain hormones are causing the muscles that push waste through your intestines to slow down.  You may develop hemorrhoids or swollen, bulging veins (varicose veins).  Your breasts may begin to grow larger and become tender. Your nipples may stick out more, and the tissue that surrounds them (areola) may become darker.  Your gums may bleed and may be sensitive to brushing and flossing.  Dark spots or blotches  (chloasma, mask of pregnancy) may develop on your face. This will likely fade after the baby is born.  Your menstrual periods will stop.  You may have a loss of appetite.  You may develop cravings for certain kinds of food.  You may have changes in your emotions from day to day, such as being excited to be pregnant or being concerned that something may go wrong with the pregnancy and baby.  You may have more vivid and strange dreams.  You may have changes in your hair. These can include thickening of your hair, rapid growth, and changes in texture. Some women also have hair loss during or after pregnancy, or hair that feels dry or thin. Your hair will most likely return to normal after your baby is born. WHAT TO EXPECT AT YOUR PRENATAL VISITS During a routine prenatal visit:  You will be weighed to make sure you and the baby are growing normally.  Your blood pressure will be taken.  Your abdomen will be measured to track your baby's growth.  The fetal heartbeat will be listened to starting around week 10 or 12 of your pregnancy.  Test results from any previous visits will be discussed. Your health care provider may ask you:  How you are feeling.  If you are feeling the baby move.  If you have had any abnormal symptoms, such as leaking fluid, bleeding, severe headaches, or abdominal cramping.  If you are using any tobacco products,   including cigarettes, chewing tobacco, and electronic cigarettes.  If you have any questions. Other tests that may be performed during your first trimester include:  Blood tests to find your blood type and to check for the presence of any previous infections. They will also be used to check for low iron levels (anemia) and Rh antibodies. Later in the pregnancy, blood tests for diabetes will be done along with other tests if problems develop.  Urine tests to check for infections, diabetes, or protein in the urine.  An ultrasound to confirm the  proper growth and development of the baby.  An amniocentesis to check for possible genetic problems.  Fetal screens for spina bifida and Down syndrome.  You may need other tests to make sure you and the baby are doing well.  HIV (human immunodeficiency virus) testing. Routine prenatal testing includes screening for HIV, unless you choose not to have this test. HOME CARE INSTRUCTIONS  Medicines  Follow your health care provider's instructions regarding medicine use. Specific medicines may be either safe or unsafe to take during pregnancy.  Take your prenatal vitamins as directed.  If you develop constipation, try taking a stool softener if your health care provider approves. Diet  Eat regular, well-balanced meals. Choose a variety of foods, such as meat or vegetable-based protein, fish, milk and low-fat dairy products, vegetables, fruits, and whole grain breads and cereals. Your health care provider will help you determine the amount of weight gain that is right for you.  Avoid raw meat and uncooked cheese. These carry germs that can cause birth defects in the baby.  Eating four or five small meals rather than three large meals a day may help relieve nausea and vomiting. If you start to feel nauseous, eating a few soda crackers can be helpful. Drinking liquids between meals instead of during meals also seems to help nausea and vomiting.  If you develop constipation, eat more high-fiber foods, such as fresh vegetables or fruit and whole grains. Drink enough fluids to keep your urine clear or pale yellow. Activity and Exercise  Exercise only as directed by your health care provider. Exercising will help you:  Control your weight.  Stay in shape.  Be prepared for labor and delivery.  Experiencing pain or cramping in the lower abdomen or low back is a good sign that you should stop exercising. Check with your health care provider before continuing normal exercises.  Try to avoid  standing for long periods of time. Move your legs often if you must stand in one place for a long time.  Avoid heavy lifting.  Wear low-heeled shoes, and practice good posture.  You may continue to have sex unless your health care provider directs you otherwise. Relief of Pain or Discomfort  Wear a good support bra for breast tenderness.   Take warm sitz baths to soothe any pain or discomfort caused by hemorrhoids. Use hemorrhoid cream if your health care provider approves.   Rest with your legs elevated if you have leg cramps or low back pain.  If you develop varicose veins in your legs, wear support hose. Elevate your feet for 15 minutes, 3-4 times a day. Limit salt in your diet. Prenatal Care  Schedule your prenatal visits by the twelfth week of pregnancy. They are usually scheduled monthly at first, then more often in the last 2 months before delivery.  Write down your questions. Take them to your prenatal visits.  Keep all your prenatal visits as directed by your   health care provider. Safety  Wear your seat belt at all times when driving.  Make a list of emergency phone numbers, including numbers for family, friends, the hospital, and police and fire departments. General Tips  Ask your health care provider for a referral to a local prenatal education class. Begin classes no later than at the beginning of month 6 of your pregnancy.  Ask for help if you have counseling or nutritional needs during pregnancy. Your health care provider can offer advice or refer you to specialists for help with various needs.  Do not use hot tubs, steam rooms, or saunas.  Do not douche or use tampons or scented sanitary pads.  Do not cross your legs for long periods of time.  Avoid cat litter boxes and soil used by cats. These carry germs that can cause birth defects in the baby and possibly loss of the fetus by miscarriage or stillbirth.  Avoid all smoking, herbs, alcohol, and medicines  not prescribed by your health care provider. Chemicals in these affect the formation and growth of the baby.  Do not use any tobacco products, including cigarettes, chewing tobacco, and electronic cigarettes. If you need help quitting, ask your health care provider. You may receive counseling support and other resources to help you quit.  Schedule a dentist appointment. At home, brush your teeth with a soft toothbrush and be gentle when you floss. SEEK MEDICAL CARE IF:   You have dizziness.  You have mild pelvic cramps, pelvic pressure, or nagging pain in the abdominal area.  You have persistent nausea, vomiting, or diarrhea.  You have a bad smelling vaginal discharge.  You have pain with urination.  You notice increased swelling in your face, hands, legs, or ankles. SEEK IMMEDIATE MEDICAL CARE IF:   You have a fever.  You are leaking fluid from your vagina.  You have spotting or bleeding from your vagina.  You have severe abdominal cramping or pain.  You have rapid weight gain or loss.  You vomit blood or material that looks like coffee grounds.  You are exposed to [place] measles and have never had them.  You are exposed to fifth disease or chickenpox.  You develop a severe headache.  You have shortness of breath.  You have any kind of trauma, such as from a fall or a car accident.   This information is not intended to replace advice given to you by your health care provider. Make sure you discuss any questions you have with your health care provider.   Document Released: 02/23/2001 Document Revised: 03/22/2014 Document Reviewed: 01/09/2013 Elsevier Interactive Patient Education 2016 [place] Medications in Pregnancy   Acne: Benzoyl Peroxide Salicylic Acid  Backache/Headache: Tylenol: 2 regular strength every 4 hours OR              2 Extra strength every 6 hours  Colds/Coughs/Allergies: Benadryl (alcohol free) 25 mg every 6 hours as  needed Breath right strips Claritin Cepacol throat lozenges Chloraseptic throat spray Cold-Eeze- up to three times per day Cough drops, alcohol free Flonase (by prescription only) Guaifenesin Mucinex Robitussin DM (plain only, alcohol free) Saline nasal spray/drops Sudafed (pseudoephedrine) & Actifed ** use only after [redacted] weeks gestation and if you do not have high blood pressure Tylenol Vicks Vaporub Zinc lozenges Zyrtec   Constipation: Colace Ducolax suppositories Fleet enema Glycerin suppositories Metamucil Milk of magnesia Miralax Senokot Smooth move tea  Diarrhea: Kaopectate Imodium A-D  *NO pepto Bismol  Hemorrhoids: Anusol Anusol  HC Preparation H Tucks  Indigestion: Tums Maalox Mylanta Zantac  Pepcid  Insomnia: Benadryl (alcohol free) 25mg  every 6 hours as needed Tylenol PM Unisom, no Gelcaps  Leg Cramps: Tums MagGel  Nausea/Vomiting:  Bonine Dramamine Emetrol Ginger extract Sea bands Meclizine  Nausea medication to take during pregnancy:  Unisom (doxylamine succinate 25 mg tablets) Take one tablet daily at bedtime. If symptoms are not adequately controlled, the dose can be increased to a maximum recommended dose of two tablets daily (1/2 tablet in the morning, 1/2 tablet mid-afternoon and one at bedtime). Vitamin B6 100mg  tablets. Take one tablet twice a day (up to 200 mg per day).  Skin Rashes: Aveeno products Benadryl cream or 25mg  every 6 hours as needed Calamine Lotion 1% cortisone cream  Yeast infection: Gyne-lotrimin 7 Monistat 7   **If taking multiple medications, please check labels to avoid duplicating the same active ingredients **take medication as directed on the label ** Do not exceed 4000 mg of tylenol in 24 hours **Do not take medications that contain aspirin or ibuprofen

## 2015-01-03 NOTE — Progress Notes (Signed)
Subjective:    Ann Brock is a [id number] [redacted]w[redacted]d being seen today for her first obstetrical visit.  Her obstetrical history is significant for history of chronic hypertension and prior csection. Patient does intend to breast feed. Pregnancy history fully reviewed.  Patient reports nausea, no bleeding and no cramping.  Filed Vitals:   01/03/15 0844  BP: 119/78  Pulse: 75  Weight: 256 lb (116.121 kg)    HISTORY: OB History  Gravida Para Term Preterm AB SAB TAB Ectopic Multiple Living  5 2 2  2 1  1  2     # Outcome Date GA Lbr Len/2nd Weight Sex Delivery Anes PTL Lv  5 Current           4 Term 04/20/11 [redacted]w[redacted]d  7 lb 7 oz (3.374 kg) M CS-LTranv  N Y     Complications: Fetal Intolerance,Postpartum hemorrhage  3 Ectopic 2011 [redacted]w[redacted]d         2 Term 12/21/07 [redacted]w[redacted]d  7 lb 7 oz (3.374 kg) M Vag-Spont EPI N Y     Complications: Hyperemesis,Chorioamnionitis  1 SAB 2007 [redacted]w[redacted]d           Obstetric Comments  MTX   Past Medical History  Diagnosis Date  . Hypertension   . Hyperemesis gravidarum   . Vaginal Pap smear, abnormal     repeat was normal   Past Surgical History  Procedure Laterality Date  . Cholecystectomy    . Cesarean section    . Knee surgery    . Tonsillectomy and adenoidectomy     Family History  Problem Relation Age of Onset  . Hypertension Mother   . Depression Mother   . Drug abuse Father   . Heart disease Father   . Hyperlipidemia Father   . Hypertension Father   . Stroke Father   . Cancer Maternal Grandmother   . Diabetes Maternal Grandmother   . Stroke Maternal Grandmother   . Cancer Maternal Grandfather   . Stroke Maternal Grandfather   . Alcohol abuse Paternal Grandmother   . Cancer Paternal Grandmother   . Stroke Paternal Grandmother   . Alcohol abuse Paternal Grandfather   . Cancer Paternal Grandfather   . Stroke Paternal Grandfather      Exam    BP 119/78 mmHg  Pulse 75  Wt 256 lb (116.121 kg)  LMP 11/05/2014 (Exact Date) Uterine Size:  size equals dates  Pelvic Exam:    Perineum: No Hemorrhoids, Normal Perineum   Vulva: normal   Vagina:  normal mucosa, normal discharge, no palpable nodules   pH: Not done   Cervix: no bleeding following Pap, no cervical motion tenderness and no lesions; increased yellow discharge   Adnexa: normal adnexa and no mass, fullness, tenderness   Bony Pelvis: Adequate  System: Breast:  No nipple retraction or dimpling, No nipple discharge or bleeding, No axillary or supraclavicular adenopathy, Normal to palpation without dominant masses   Skin: normal coloration and turgor, no rashes    Neurologic: negative   Extremities: normal strength, tone, and muscle mass   HEENT neck supple with midline trachea and thyroid without masses   Mouth/Teeth mucous membranes moist, pharynx normal without lesions   Neck supple and no masses   Cardiovascular: regular rate and rhythm, no murmurs or gallops   Respiratory:  appears well, vitals normal, no respiratory distress, acyanotic, normal RR, neck free of mass or lymphadenopathy, chest clear, no wheezing, crepitations, rhonchi, normal symmetric air entry   Abdomen: soft, non-tender;  bowel sounds normal; no masses,  no organomegaly   Urinary: urethral meatus normal      Assessment:    Pregnancy: [id number] Patient Active Problem List   Diagnosis Date Noted  . Supervision of normal pregnancy 01/03/2015  . UTI (urinary tract infection) 12/05/2014  . Normal pregnancy 12/04/2014  . History of ectopic pregnancy 12/04/2014  . Neck pain 11/14/2014  . Elevated blood pressure 11/14/2014     2. Chronic Hypertension - Baseline labs, cannot take ASA due to allergy  3.  Hx of Prior Csection - Given consent form to review  4.  Nausea and Vomiting  - RX Zofran  Plan:     Initial labs drawn. RX Zofran; pt understand potential risks and accepts responsibility Prenatal vitamins. Problem list reviewed and updated. Genetic Screening discussed First Screen:  requested.  Follow up in 4 weeks.    Gwen Pounds 01/03/2015

## 2015-01-03 NOTE — Progress Notes (Signed)
FOB has  CF trait and one of the son's has CF trait.  Bedside U/S today showed IUP with FHT of 166 BPM and CRl 17.67mm  GA [redacted]w[redacted]d

## 2015-01-04 LAB — HIV ANTIBODY (ROUTINE TESTING W REFLEX): HIV: NONREACTIVE

## 2015-01-04 LAB — COMPREHENSIVE METABOLIC PANEL
ALK PHOS: 68 U/L (ref 33–115)
ALT: 41 U/L — AB (ref 6–29)
AST: 23 U/L (ref 10–30)
Albumin: 4 g/dL (ref 3.6–5.1)
BILIRUBIN TOTAL: 0.5 mg/dL (ref 0.2–1.2)
BUN: 7 mg/dL (ref 7–25)
CALCIUM: 8.8 mg/dL (ref 8.6–10.2)
CO2: 26 mmol/L (ref 20–31)
CREATININE: 0.61 mg/dL (ref 0.50–1.10)
Chloride: 101 mmol/L (ref 98–110)
GLUCOSE: 87 mg/dL (ref 65–99)
Potassium: 3.8 mmol/L (ref 3.5–5.3)
SODIUM: 135 mmol/L (ref 135–146)
Total Protein: 7 g/dL (ref 6.1–8.1)

## 2015-01-06 LAB — OBSTETRIC PANEL
ANTIBODY SCREEN: NEGATIVE
BASOS PCT: 0 % (ref 0–1)
Basophils Absolute: 0 10*3/uL (ref 0.0–0.1)
EOS ABS: 0.1 10*3/uL (ref 0.0–0.7)
EOS PCT: 1 % (ref 0–5)
HEMATOCRIT: 39.6 % (ref 36.0–46.0)
HEMOGLOBIN: 13.4 g/dL (ref 12.0–15.0)
Hepatitis B Surface Ag: NEGATIVE
Lymphocytes Relative: 21 % (ref 12–46)
Lymphs Abs: 1.4 10*3/uL (ref 0.7–4.0)
MCH: 27.5 pg (ref 26.0–34.0)
MCHC: 33.8 g/dL (ref 30.0–36.0)
MCV: 81.3 fL (ref 78.0–100.0)
MONOS PCT: 8 % (ref 3–12)
MPV: 10 fL (ref 8.6–12.4)
Monocytes Absolute: 0.5 10*3/uL (ref 0.1–1.0)
NEUTROS ABS: 4.8 10*3/uL (ref 1.7–7.7)
NEUTROS PCT: 70 % (ref 43–77)
Platelets: 219 10*3/uL (ref 150–400)
RBC: 4.87 MIL/uL (ref 3.87–5.11)
RDW: 13.8 % (ref 11.5–15.5)
RUBELLA: 11.9 {index} — AB (ref <0.90)
Rh Type: POSITIVE
WBC: 6.8 10*3/uL (ref 4.0–10.5)

## 2015-01-06 LAB — CYTOLOGY - PAP

## 2015-01-07 ENCOUNTER — Telehealth: Payer: Self-pay | Admitting: *Deleted

## 2015-01-07 ENCOUNTER — Other Ambulatory Visit: Payer: Self-pay | Admitting: Family

## 2015-01-07 LAB — CULTURE, URINE COMPREHENSIVE

## 2015-01-07 MED ORDER — CIPROFLOXACIN HCL 500 MG PO TABS: 500.0000 mg | ORAL_TABLET | Freq: Two times a day (BID) | ORAL | Status: DC

## 2015-01-07 NOTE — Telephone Encounter (Signed)
Attempted to contact patient via all the numbers provided in her chart.  No phone numbers are valid.

## 2015-01-07 NOTE — Telephone Encounter (Signed)
-----   Message from Gwen Pounds, CNM sent at 01/07/2015 10:23 AM EDT ----- Regarding: RE: RX? Yes, I've sent in a RX for Cipro.  Thx!!!! ----- Message -----    From: Asencion Islam, RN    Sent: 01/07/2015   8:48 AM      To: Gwen Pounds, CNM Subject: YV?                                            Hey [id number], Please look at pt's urine culture from Friday.  Do you want to treat her?  She was a NOB  Thanks, [employer]

## 2015-01-21 ENCOUNTER — Other Ambulatory Visit: Payer: Self-pay | Admitting: Family

## 2015-01-21 DIAGNOSIS — O210 Mild hyperemesis gravidarum: Secondary | ICD-10-CM

## 2015-01-21 NOTE — Telephone Encounter (Signed)
RF request Ok for Zofran 4 mg due to hyperemesis in pregnancy

## 2015-02-03 ENCOUNTER — Ambulatory Visit (INDEPENDENT_AMBULATORY_CARE_PROVIDER_SITE_OTHER): Payer: BLUE CROSS/BLUE SHIELD | Admitting: Advanced Practice Midwife

## 2015-02-03 ENCOUNTER — Encounter: Payer: Self-pay | Admitting: Advanced Practice Midwife

## 2015-02-03 VITALS — BP 126/78 | HR 97 | Wt 252.0 lb

## 2015-02-03 DIAGNOSIS — O219 Vomiting of pregnancy, unspecified: Secondary | ICD-10-CM

## 2015-02-03 DIAGNOSIS — O34219 Maternal care for unspecified type scar from previous cesarean delivery: Secondary | ICD-10-CM

## 2015-02-03 DIAGNOSIS — Z3481 Encounter for supervision of other normal pregnancy, first trimester: Secondary | ICD-10-CM

## 2015-02-03 MED ORDER — PRENATE PIXIE 10-0.6-0.4-200 MG PO CAPS: 1.0000 | ORAL_CAPSULE | Freq: Every day | ORAL | Status: DC

## 2015-02-03 MED ORDER — METOCLOPRAMIDE HCL 10 MG PO TABS: 10.0000 mg | ORAL_TABLET | Freq: Three times a day (TID) | ORAL | Status: DC

## 2015-02-03 MED ORDER — ONDANSETRON HCL 4 MG PO TABS: 4.0000 mg | ORAL_TABLET | Freq: Every day | ORAL | Status: DC | PRN

## 2015-02-03 NOTE — Addendum Note (Signed)
Addended by: Asencion Islam on: 02/03/2015 08:59 AM   Modules accepted: Orders

## 2015-02-03 NOTE — Progress Notes (Signed)
Subjective:  Nelsy Yarn is a 29 y.o. [id number] at [redacted]w[redacted]d being seen today for ongoing prenatal care.  She is currently monitored for the following issues for this high-risk pregnancy:    Has decided she probably wants a repeat C/S.  States they were both traumatized by emergent C/S and want it to be a planned thing. Don't want to be in position of laboring and needing another emergent C/S Wants to try Prenate Pixie PNV, still unable tolerate even Flintstones Will try Rx Reglan for nausea.  Wants refill on Zofran. Reiterated warnings of possible fetal effects. Will schedule First trimester screen at [facility]. Her sister's doctor wants her to do genetic testing due to sister's brain degeneration history. They are not sure if it is genetic yet. Discussed Genetic Counseling available at [facility]. Pt does not want to do right now but will consider it.  Patient Active Problem List   Diagnosis Date Noted  . Supervision of normal pregnancy 01/03/2015  . Chronic hypertension in pregnancy 01/03/2015  . History of cesarean delivery, currently pregnant 01/03/2015  . UTI (urinary tract infection) 12/05/2014  . Normal pregnancy 12/04/2014  . History of ectopic pregnancy 12/04/2014  . Neck pain 11/14/2014  . Elevated blood pressure 11/14/2014   Patient reports nausea, no bleeding and no cramping.  Contractions: Not present. Vag. Bleeding: None.  Movement: Absent. Denies leaking of fluid.   The following portions of the patient's history were reviewed and updated as appropriate: allergies, current medications, past family history, past medical history, past social history, past surgical history and problem list. Problem list updated.  Objective:   Filed Vitals:   02/03/15 0805  BP: 126/78  Pulse: 97  Weight: 114.306 kg (252 lb)    Fetal Status: Fetal Heart Rate (bpm): 156   Movement: Absent     General:  Alert, oriented and cooperative. Patient is in no acute distress.  Skin: Skin is warm and dry. No  rash noted.   Cardiovascular: Normal heart rate noted  Respiratory: Normal respiratory effort, no problems with respiration noted  Abdomen: Soft, gravid, appropriate for gestational age. Pain/Pressure: Absent     Pelvic: Vag. Bleeding: None Vag D/C Character: Curdy   Cervical exam deferred        Extremities: Normal range of motion.  Edema: Trace  Mental Status: Normal mood and affect. Normal behavior. Normal judgment and thought content.   Urinalysis: Urine Protein: 1+ Urine Glucose: Negative  Assessment and Plan:  Pregnancy: [id number] at [redacted]w[redacted]d  1. Nausea and vomiting during pregnancy      Rx Reglan to try      Refill Zofran      Rx Prenate Pixie. Discussed it is high tier on Pharmacy benefit.  2. History of cesarean delivery, currently pregnant      Leaning toward Repeat C/S  Preterm labor symptoms and general obstetric precautions including but not limited to vaginal bleeding, contractions, leaking of fluid and fetal movement were reviewed in detail with the patient. Please refer to After Visit Summary for other counseling recommendations.  Return in about 4 weeks (around 03/03/2015) for [employer].   Seabron Spates, CNM

## 2015-02-03 NOTE — Patient Instructions (Signed)
First Trimester of Pregnancy The first trimester of pregnancy is from week 1 until the end of week 12 (months 1 through 3). A week after a sperm fertilizes an egg, the egg will implant on the wall of the uterus. This embryo will begin to develop into a baby. Genes from you and your partner are forming the baby. The female genes determine whether the baby is a boy or a girl. At 6-8 weeks, the eyes and face are formed, and the heartbeat can be seen on ultrasound. At the end of 12 weeks, all the baby's organs are formed.  Now that you are pregnant, you will want to do everything you can to have a healthy baby. Two of the most important things are to get good prenatal care and to follow your health care provider's instructions. Prenatal care is all the medical care you receive before the baby's birth. This care will help prevent, find, and treat any problems during the pregnancy and childbirth. BODY CHANGES Your body goes through many changes during pregnancy. The changes vary from woman to woman.   You may gain or lose a couple of pounds at first.  You may feel sick to your stomach (nauseous) and throw up (vomit). If the vomiting is uncontrollable, call your health care provider.  You may tire easily.  You may develop headaches that can be relieved by medicines approved by your health care provider.  You may urinate more often. Painful urination may mean you have a bladder infection.  You may develop heartburn as a result of your pregnancy.  You may develop constipation because certain hormones are causing the muscles that push waste through your intestines to slow down.  You may develop hemorrhoids or swollen, bulging veins (varicose veins).  Your breasts may begin to grow larger and become tender. Your nipples may stick out more, and the tissue that surrounds them (areola) may become darker.  Your gums may bleed and may be sensitive to brushing and flossing.  Dark spots or blotches (chloasma,  mask of pregnancy) may develop on your face. This will likely fade after the baby is born.  Your menstrual periods will stop.  You may have a loss of appetite.  You may develop cravings for certain kinds of food.  You may have changes in your emotions from day to day, such as being excited to be pregnant or being concerned that something may go wrong with the pregnancy and baby.  You may have more vivid and strange dreams.  You may have changes in your hair. These can include thickening of your hair, rapid growth, and changes in texture. Some women also have hair loss during or after pregnancy, or hair that feels dry or thin. Your hair will most likely return to normal after your baby is born. WHAT TO EXPECT AT YOUR PRENATAL VISITS During a routine prenatal visit:  You will be weighed to make sure you and the baby are growing normally.  Your blood pressure will be taken.  Your abdomen will be measured to track your baby's growth.  The fetal heartbeat will be listened to starting around week 10 or 12 of your pregnancy.  Test results from any previous visits will be discussed. Your health care provider may ask you:  How you are feeling.  If you are feeling the baby move.  If you have had any abnormal symptoms, such as leaking fluid, bleeding, severe headaches, or abdominal cramping.  If you are using any tobacco products,   including cigarettes, chewing tobacco, and electronic cigarettes.  If you have any questions. Other tests that may be performed during your first trimester include:  Blood tests to find your blood type and to check for the presence of any previous infections. They will also be used to check for low iron levels (anemia) and Rh antibodies. Later in the pregnancy, blood tests for diabetes will be done along with other tests if problems develop.  Urine tests to check for infections, diabetes, or protein in the urine.  An ultrasound to confirm the proper growth  and development of the baby.  An amniocentesis to check for possible genetic problems.  Fetal screens for spina bifida and Down syndrome.  You may need other tests to make sure you and the baby are doing well.  HIV (human immunodeficiency virus) testing. Routine prenatal testing includes screening for HIV, unless you choose not to have this test. HOME CARE INSTRUCTIONS  Medicines  Follow your health care provider's instructions regarding medicine use. Specific medicines may be either safe or unsafe to take during pregnancy.  Take your prenatal vitamins as directed.  If you develop constipation, try taking a stool softener if your health care provider approves. Diet  Eat regular, well-balanced meals. Choose a variety of foods, such as meat or vegetable-based protein, fish, milk and low-fat dairy products, vegetables, fruits, and whole grain breads and cereals. Your health care provider will help you determine the amount of weight gain that is right for you.  Avoid raw meat and uncooked cheese. These carry germs that can cause birth defects in the baby.  Eating four or five small meals rather than three large meals a day may help relieve nausea and vomiting. If you start to feel nauseous, eating a few soda crackers can be helpful. Drinking liquids between meals instead of during meals also seems to help nausea and vomiting.  If you develop constipation, eat more high-fiber foods, such as fresh vegetables or fruit and whole grains. Drink enough fluids to keep your urine clear or pale yellow. Activity and Exercise  Exercise only as directed by your health care provider. Exercising will help you:  Control your weight.  Stay in shape.  Be prepared for labor and delivery.  Experiencing pain or cramping in the lower abdomen or low back is a good sign that you should stop exercising. Check with your health care provider before continuing normal exercises.  Try to avoid standing for long  periods of time. Move your legs often if you must stand in one place for a long time.  Avoid heavy lifting.  Wear low-heeled shoes, and practice good posture.  You may continue to have sex unless your health care provider directs you otherwise. Relief of Pain or Discomfort  Wear a good support bra for breast tenderness.   Take warm sitz baths to soothe any pain or discomfort caused by hemorrhoids. Use hemorrhoid cream if your health care provider approves.   Rest with your legs elevated if you have leg cramps or low back pain.  If you develop varicose veins in your legs, wear support hose. Elevate your feet for 15 minutes, 3-4 times a day. Limit salt in your diet. Prenatal Care  Schedule your prenatal visits by the twelfth week of pregnancy. They are usually scheduled monthly at first, then more often in the last 2 months before delivery.  Write down your questions. Take them to your prenatal visits.  Keep all your prenatal visits as directed by your   health care provider. Safety  Wear your seat belt at all times when driving.  Make a list of emergency phone numbers, including numbers for family, friends, the hospital, and police and fire departments. General Tips  Ask your health care provider for a referral to a local prenatal education class. Begin classes no later than at the beginning of month 6 of your pregnancy.  Ask for help if you have counseling or nutritional needs during pregnancy. Your health care provider can offer advice or refer you to specialists for help with various needs.  Do not use hot tubs, steam rooms, or saunas.  Do not douche or use tampons or scented sanitary pads.  Do not cross your legs for long periods of time.  Avoid cat litter boxes and soil used by cats. These carry germs that can cause birth defects in the baby and possibly loss of the fetus by miscarriage or stillbirth.  Avoid all smoking, herbs, alcohol, and medicines not prescribed by  your health care provider. Chemicals in these affect the formation and growth of the baby.  Do not use any tobacco products, including cigarettes, chewing tobacco, and electronic cigarettes. If you need help quitting, ask your health care provider. You may receive counseling support and other resources to help you quit.  Schedule a dentist appointment. At home, brush your teeth with a soft toothbrush and be gentle when you floss. SEEK MEDICAL CARE IF:   You have dizziness.  You have mild pelvic cramps, pelvic pressure, or nagging pain in the abdominal area.  You have persistent nausea, vomiting, or diarrhea.  You have a bad smelling vaginal discharge.  You have pain with urination.  You notice increased swelling in your face, hands, legs, or ankles. SEEK IMMEDIATE MEDICAL CARE IF:   You have a fever.  You are leaking fluid from your vagina.  You have spotting or bleeding from your vagina.  You have severe abdominal cramping or pain.  You have rapid weight gain or loss.  You vomit blood or material that looks like coffee grounds.  You are exposed to German measles and have never had them.  You are exposed to fifth disease or chickenpox.  You develop a severe headache.  You have shortness of breath.  You have any kind of trauma, such as from a fall or a car accident.   This information is not intended to replace advice given to you by your health care provider. Make sure you discuss any questions you have with your health care provider.   Document Released: 02/23/2001 Document Revised: 03/22/2014 Document Reviewed: 01/09/2013 Elsevier Interactive Patient Education 2016 Elsevier Inc.  

## 2015-03-03 ENCOUNTER — Ambulatory Visit (INDEPENDENT_AMBULATORY_CARE_PROVIDER_SITE_OTHER): Payer: BLUE CROSS/BLUE SHIELD | Admitting: Advanced Practice Midwife

## 2015-03-03 VITALS — BP 132/69 | HR 78 | Wt 252.0 lb

## 2015-03-03 DIAGNOSIS — Z36 Encounter for antenatal screening of mother: Secondary | ICD-10-CM | POA: Diagnosis not present

## 2015-03-03 DIAGNOSIS — O1202 Gestational edema, second trimester: Secondary | ICD-10-CM

## 2015-03-03 DIAGNOSIS — O2202 Varicose veins of lower extremity in pregnancy, second trimester: Secondary | ICD-10-CM

## 2015-03-03 DIAGNOSIS — Z3482 Encounter for supervision of other normal pregnancy, second trimester: Secondary | ICD-10-CM

## 2015-03-03 DIAGNOSIS — R609 Edema, unspecified: Secondary | ICD-10-CM

## 2015-03-03 DIAGNOSIS — Z3492 Encounter for supervision of normal pregnancy, unspecified, second trimester: Secondary | ICD-10-CM

## 2015-03-03 MED ORDER — T.E.D. THIGH LENGTH/L-REGULAR MISC: 1.0000 | Freq: Every day | Status: DC

## 2015-03-03 NOTE — Progress Notes (Signed)
Subjective:  Liselle Zombro is a 29 y.o. [id number] at [redacted]w[redacted]d being seen today for ongoing prenatal care.  She is currently monitored for the following issues for this high-risk pregnancy and has Neck pain; Elevated blood pressure; Normal pregnancy; History of ectopic pregnancy; UTI (urinary tract infection); Supervision of normal pregnancy; Chronic hypertension in pregnancy; and History of cesarean delivery, currently pregnant on her problem list.  Patient reports no complaints.  Contractions: Not present. Vag. Bleeding: None.  Movement: Absent. Denies leaking of fluid.   The following portions of the patient's history were reviewed and updated as appropriate: allergies, current medications, past family history, past medical history, past social history, past surgical history and problem list. Problem list updated.  Objective:   Filed Vitals:   03/03/15 1006  BP: 132/69  Pulse: 78  Weight: 252 lb (114.306 kg)    Fetal Status: Fetal Heart Rate (bpm): 144   Movement: Absent     General:  Alert, oriented and cooperative. Patient is in no acute distress.  Skin: Skin is warm and dry. No rash noted.   Cardiovascular: Normal heart rate noted  Respiratory: Normal respiratory effort, no problems with respiration noted  Abdomen: Soft, gravid, appropriate for gestational age. Pain/Pressure: Absent     Pelvic: Vag. Bleeding: None Vag D/C Character: Thick   Cervical exam deferred        Extremities: Normal range of motion.  Edema: Trace  Mental Status: Normal mood and affect. Normal behavior. Normal judgment and thought content.   Urinalysis: Urine Protein: Trace Urine Glucose: Negative  Assessment and Plan:  Pregnancy: [id number] at [redacted]w[redacted]d  1. Normal pregnancy in second trimester  - AFP, Quad Screen - [place] [facility] OB COMP + 14 WK; Future  2. Dependent edema  - Compression stockings  3. Edema during pregnancy in second trimester  - Compression stockings  4. Varicose veins of legs in pregnancy,  second trimester  - Compression stockings  Preterm labor symptoms and general obstetric precautions including but not limited to vaginal bleeding, contractions, leaking of fluid and fetal movement were reviewed in detail with the patient. Please refer to After Visit Summary for other counseling recommendations.  Return in about 4 weeks (around 03/31/2015).   Elvera Maria, CNM

## 2015-03-03 NOTE — Patient Instructions (Signed)
Edema °Edema is an abnormal buildup of fluids in your body tissues. Edema is somewhat dependent on gravity to pull the fluid to the lowest place in your body. That makes the condition more common in the legs and thighs (lower extremities). Painless swelling of the feet and ankles is common and becomes more likely as you get older. It is also common in looser tissues, like around your eyes.  °When the affected area is squeezed, the fluid may move out of that spot and leave a dent for a few moments. This dent is called pitting.  °CAUSES  °There are many possible causes of edema. Eating too much salt and being on your feet or sitting for a long time can cause edema in your legs and ankles. Hot weather may make edema worse. Common medical causes of edema include: °· Heart failure. °· Liver disease. °· Kidney disease. °· Weak blood vessels in your legs. °· Cancer. °· An injury. °· Pregnancy. °· Some medications. °· Obesity.  °SYMPTOMS  °Edema is usually painless. Your skin may look swollen or shiny.  °DIAGNOSIS  °Your health care provider may be able to diagnose edema by asking about your medical history and doing a physical exam. You may need to have tests such as X-rays, an electrocardiogram, or blood tests to check for medical conditions that may cause edema.  °TREATMENT  °Edema treatment depends on the cause. If you have heart, liver, or kidney disease, you need the treatment appropriate for these conditions. General treatment may include: °· Elevation of the affected body part above the level of your heart. °· Compression of the affected body part. Pressure from elastic bandages or support stockings squeezes the tissues and forces fluid back into the blood vessels. This keeps fluid from entering the tissues. °· Restriction of fluid and salt intake. °· Use of a water pill (diuretic). These medications are appropriate only for some types of edema. They pull fluid out of your body and make you urinate more often. This  gets rid of fluid and reduces swelling, but diuretics can have side effects. Only use diuretics as directed by your health care provider. °HOME CARE INSTRUCTIONS  °· Keep the affected body part above the level of your heart when you are lying down.   °· Do not sit still or stand for prolonged periods.   °· Do not put anything directly under your knees when lying down. °· Do not wear constricting clothing or garters on your upper legs.   °· Exercise your legs to work the fluid back into your blood vessels. This may help the swelling go down.   °· Wear elastic bandages or support stockings to reduce ankle swelling as directed by your health care provider.   °· Eat a low-salt diet to reduce fluid if your health care provider recommends it.   °· Only take medicines as directed by your health care provider.  °SEEK MEDICAL CARE IF:  °· Your edema is not responding to treatment. °· You have heart, liver, or kidney disease and notice symptoms of edema. °· You have edema in your legs that does not improve after elevating them.   °· You have sudden and unexplained weight gain. °SEEK IMMEDIATE MEDICAL CARE IF:  °· You develop shortness of breath or chest pain.   °· You cannot breathe when you lie down. °· You develop pain, redness, or warmth in the swollen areas.   °· You have heart, liver, or kidney disease and suddenly get edema. °· You have a fever and your symptoms suddenly get worse. °MAKE SURE YOU:  °·   Understand these instructions. °· Will watch your condition. °· Will get help right away if you are not doing well or get worse. °  °This information is not intended to replace advice given to you by your health care provider. Make sure you discuss any questions you have with your health care provider. °  °Document Released: 03/01/2005 Document Revised: 03/22/2014 Document Reviewed: 12/22/2012 °Elsevier Interactive Patient Education ©2016 Elsevier Inc. ° °

## 2015-03-03 NOTE — Progress Notes (Signed)
Quad screen drawn today

## 2015-03-04 LAB — AFP, QUAD SCREEN
AFP: 17.1 ng/mL
Age Alone: 1:883 {titer}
CURR GEST AGE: 16.6 wks.days
Down Syndrome Scr Risk Est: 1:1600 {titer}
HCG TOTAL: 23.81 [IU]/mL
INH: 114.5 pg/mL
Interpretation-AFP: NEGATIVE
MOM FOR INH: 1.02
MoM for AFP: 0.66
MoM for hCG: 1.02
Open Spina bifida: NEGATIVE
Tri 18 Scr Risk Est: NEGATIVE
Trisomy 18 (Edward) Syndrome Interp.: 1:30800 {titer}
UE3 MOM: 0.89
uE3 Value: 0.77 ng/mL

## 2015-03-10 ENCOUNTER — Encounter (HOSPITAL_COMMUNITY): Payer: Self-pay | Admitting: *Deleted

## 2015-03-10 ENCOUNTER — Inpatient Hospital Stay (HOSPITAL_COMMUNITY): Payer: BLUE CROSS/BLUE SHIELD

## 2015-03-10 ENCOUNTER — Inpatient Hospital Stay (HOSPITAL_COMMUNITY)
Admission: AD | Admit: 2015-03-10 | Discharge: 2015-03-10 | Disposition: A | Discharge status: Home / Self Care | Payer: BLUE CROSS/BLUE SHIELD | Source: Ambulatory Visit | Attending: Obstetrics and Gynecology | Admitting: Obstetrics and Gynecology

## 2015-03-10 DIAGNOSIS — Z8249 Family history of ischemic heart disease and other diseases of the circulatory system: Secondary | ICD-10-CM | POA: Insufficient documentation

## 2015-03-10 DIAGNOSIS — O43892 Other placental disorders, second trimester: Secondary | ICD-10-CM

## 2015-03-10 DIAGNOSIS — O209 Hemorrhage in early pregnancy, unspecified: Secondary | ICD-10-CM | POA: Diagnosis not present

## 2015-03-10 DIAGNOSIS — N939 Abnormal uterine and vaginal bleeding, unspecified: Secondary | ICD-10-CM | POA: Diagnosis present

## 2015-03-10 DIAGNOSIS — Z3A17 17 weeks gestation of pregnancy: Secondary | ICD-10-CM | POA: Diagnosis not present

## 2015-03-10 DIAGNOSIS — Z3492 Encounter for supervision of normal pregnancy, unspecified, second trimester: Secondary | ICD-10-CM

## 2015-03-10 DIAGNOSIS — Z91041 Radiographic dye allergy status: Secondary | ICD-10-CM | POA: Insufficient documentation

## 2015-03-10 DIAGNOSIS — Z87891 Personal history of nicotine dependence: Secondary | ICD-10-CM | POA: Insufficient documentation

## 2015-03-10 DIAGNOSIS — Z886 Allergy status to analgesic agent status: Secondary | ICD-10-CM | POA: Insufficient documentation

## 2015-03-10 DIAGNOSIS — O34219 Maternal care for unspecified type scar from previous cesarean delivery: Secondary | ICD-10-CM

## 2015-03-10 DIAGNOSIS — R58 Hemorrhage, not elsewhere classified: Secondary | ICD-10-CM

## 2015-03-10 DIAGNOSIS — O10912 Unspecified pre-existing hypertension complicating pregnancy, second trimester: Secondary | ICD-10-CM

## 2015-03-10 DIAGNOSIS — O418X20 Other specified disorders of amniotic fluid and membranes, second trimester, not applicable or unspecified: Secondary | ICD-10-CM

## 2015-03-10 DIAGNOSIS — O468X2 Other antepartum hemorrhage, second trimester: Secondary | ICD-10-CM

## 2015-03-10 HISTORY — DX: Unspecified infectious disease: B99.9

## 2015-03-10 HISTORY — DX: Unspecified ectopic pregnancy without intrauterine pregnancy: O00.90

## 2015-03-10 LAB — CBC
HCT: 36.9 % (ref 36.0–46.0)
Hemoglobin: 12.4 g/dL (ref 12.0–15.0)
MCH: 28.1 pg (ref 26.0–34.0)
MCHC: 33.6 g/dL (ref 30.0–36.0)
MCV: 83.5 fL (ref 78.0–100.0)
Platelets: 164 10*3/uL (ref 150–400)
RBC: 4.42 MIL/uL (ref 3.87–5.11)
RDW: 14.3 % (ref 11.5–15.5)
WBC: 8.6 10*3/uL (ref 4.0–10.5)

## 2015-03-10 LAB — WET PREP, GENITAL
Clue Cells Wet Prep HPF POC: NONE SEEN
Sperm: NONE SEEN
Trich, Wet Prep: NONE SEEN
Yeast Wet Prep HPF POC: NONE SEEN

## 2015-03-10 NOTE — MAU Note (Signed)
Started bleeding bleeding on Sat.  Went to [facility].  Couldn't tell what was happening with cervix.   Was told " body was maybe trying to abort". Was told to follow up today. Called office, is closed today, was told to come here.  Has a brownish  D/c.   On Sat  US showed viable IUP, could not tell her anything about placenta or anything else. First episode of bleeding this preg

## 2015-03-10 NOTE — MAU Note (Signed)
Urine in lab 

## 2015-03-10 NOTE — MAU Provider Note (Signed)
History     CSN: [id number]  Arrival date and time: 03/10/15 [id number]   None     Chief Complaint  Patient presents with  . Vaginal Bleeding   HPI Ann Brock 29 y.o. [id number] @ [redacted]w[redacted]d presents to MAU with the complaint of having bright red bleeding3 days ago and not having bleeding since. She went to [facility] on Saturday and states an ultrasound showed postive fht's. She tried to follow up in the office today but the office was closed. She denies vaginal bleeding, cramping and reports positive fht's.  Past Medical History  Diagnosis Date  . Hyperemesis gravidarum   . Vaginal Pap smear, abnormal     repeat was normal  . Ectopic pregnancy     treated with methotrexate  . Hypertension     prior to preg, no meds  . Infection     UTI    Past Surgical History  Procedure Laterality Date  . Cholecystectomy    . Cesarean section    . Knee surgery    . Tonsillectomy and adenoidectomy      Family History  Problem Relation Age of Onset  . Hypertension Mother   . Depression Mother   . Drug abuse Father   . Heart disease Father   . Hyperlipidemia Father   . Hypertension Father   . Stroke Father   . Cancer Maternal Grandmother   . Diabetes Maternal Grandmother   . Stroke Maternal Grandmother   . Cancer Maternal Grandfather   . Stroke Maternal Grandfather   . Alcohol abuse Paternal Grandmother   . Cancer Paternal Grandmother   . Stroke Paternal Grandmother   . Alcohol abuse Paternal Grandfather   . Cancer Paternal Grandfather   . Stroke Paternal Grandfather     Social History  Substance Use Topics  . Smoking status: Former Research scientist (life sciences)  . Smokeless tobacco: Never Used     Comment: quit  2006  . Alcohol Use: 0.0 oz/week    0 Standard drinks or equivalent per week     Comment: socially    Allergies:  Allergies  Allergen Reactions  . Aspirin Anaphylaxis  . Cephalosporins Anaphylaxis  . Contrast Media  [Iodinated Diagnostic Agents] Anaphylaxis    Prescriptions prior  to admission  Medication Sig Dispense Refill Last Dose  . acetaminophen (TYLENOL) 325 MG tablet Take 325 mg by mouth every 6 (six) hours as needed for headache.   Past Week at Unknown time  . cetirizine (ZYRTEC) 10 MG tablet Take 10 mg by mouth daily as needed for allergies.   Past Month at Unknown time  . diphenoxylate-atropine (LOMOTIL) 2.5-0.025 MG tablet Take 2 tablets by mouth 4 (four) times daily as needed for diarrhea or loose stools. 30 tablet 0   . Elastic Bandages & Supports (T.E.D. THIGH LENGTH/L-REGULAR) MISC 1 Device by Does not apply route daily. 2 each 0   . metoCLOPramide (REGLAN) 10 MG tablet Take 1 tablet (10 mg total) by mouth 3 (three) times daily with meals. 90 tablet 1   . ondansetron (ZOFRAN) 4 MG tablet Take 1 tablet (4 mg total) by mouth daily as needed for nausea or vomiting. 30 tablet 1   . ondansetron (ZOFRAN-ODT) 4 MG disintegrating tablet DISSOLVE ONE TABLET IN MOUTH EVERY 6 HOURS AS NEEDED FOR NAUSEA 20 tablet 0   . Prenat-FeAsp-Meth-FA-DHA w/o A (PRENATE PIXIE) 10-0.6-0.4-200 MG CAPS Take 1 tablet by mouth daily. 30 capsule 12   . promethazine (PHENERGAN) 25 MG suppository Place 1 suppository (25  mg total) rectally every 6 (six) hours as needed for nausea. May be used vaginally 12 suppository 1   . promethazine (PHENERGAN) 25 MG tablet Take 0.5-1 tablets (12.5-25 mg total) by mouth every 6 (six) hours as needed. 30 tablet 2     Review of Systems  Constitutional: Negative for fever.  Genitourinary:       Vaginal bleeding 3 days ago  All other systems reviewed and are negative.  Physical Exam   Blood pressure 127/70, pulse 84, temperature 98.2 F (36.8 C), temperature source Oral, resp. rate 18, height 5' 5.5" (1.664 m), weight 112.129 kg (247 lb 3.2 oz), last menstrual period 11/05/2014.  Physical Exam  Nursing note and vitals reviewed. Constitutional: She is oriented to person, place, and time. She appears well-developed and well-nourished. No distress.    HENT:  Head: Normocephalic and atraumatic.  Cardiovascular: Normal rate.   Respiratory: Effort normal and breath sounds normal. No respiratory distress.  GI: Soft. There is tenderness.  Genitourinary: Cervix exhibits discharge.  On spec exam. Cervix appears to be open to approx 1 cm. Thick discharge possibly membranes seen at os. Bright red bleeding (small amount ) noted coming from cervical os.  Musculoskeletal: Normal range of motion.  Neurological: She is alert and oriented to person, place, and time.  Skin: Skin is warm and dry.  Psychiatric: She has a normal mood and affect. Her behavior is normal. Judgment and thought content normal.    Results for orders placed or performed during the hospital encounter of 03/10/15 (from the past 24 hour(s))  Wet prep, genital     Status: Abnormal   Collection Time: 03/10/15 10:44 AM  Result Value Ref Range   Yeast Wet Prep HPF POC NONE SEEN NONE SEEN   Trich, Wet Prep NONE SEEN NONE SEEN   Clue Cells Wet Prep HPF POC NONE SEEN NONE SEEN   WBC, Wet Prep HPF POC MODERATE (A) NONE SEEN   Sperm NONE SEEN   CBC     Status: None   Collection Time: 03/10/15 10:59 AM  Result Value Ref Range   WBC 8.6 4.0 - 10.5 K/uL   RBC 4.42 3.87 - 5.11 MIL/uL   Hemoglobin 12.4 12.0 - 15.0 g/dL   HCT 36.9 36.0 - 46.0 %   MCV 83.5 78.0 - 100.0 fL   MCH 28.1 26.0 - 34.0 pg   MCHC 33.6 30.0 - 36.0 g/dL   RDW 14.3 11.5 - 15.5 %   Platelets 164 150 - 400 K/uL  [place] [facility] Ob Limited  03/10/2015  OBSTETRICAL ULTRASOUND: This exam was performed within a [facility]. The OB US report was generated in the [facility] system, and faxed to the ordering physician.  This report is available in the [facility]. See the [facility] Obstetric US report via the Image Link.  MAU Course  Procedures  MDM Suspicious for SAB; Pending ultrasound; unable to obtain FHT"s with doppler. Pt has subchorionic mass that is probable for causing the bleeding. She will need to follow up  with ultrasound in 4 weeks to evaluate the placenta. She will be advised to follow up in the clinic this week. Pelvic rest advised  Assessment and Plan  Subchorionic Hemorrhage Pelvic Rest Follow up in office this week Discharge   Ann Brock 03/10/2015, 10:49 AM

## 2015-03-10 NOTE — Discharge Instructions (Signed)
Subchorionic Hematoma °A subchorionic hematoma is a gathering of blood between the outer wall of the placenta and the inner wall of the womb (uterus). The placenta is the organ that connects the fetus to the wall of the uterus. The placenta performs the feeding, breathing (oxygen to the fetus), and waste removal (excretory work) of the fetus.  °Subchorionic hematoma is the most common abnormality found on a result from ultrasonography done during the first trimester or early second trimester of pregnancy. If there has been little or no vaginal bleeding, early small hematomas usually shrink on their own and do not affect your baby or pregnancy. The blood is gradually absorbed over 1-2 weeks. When bleeding starts later in pregnancy or the hematoma is larger or occurs in an older pregnant woman, the outcome may not be as good. Larger hematomas may get bigger, which increases the chances for miscarriage. Subchorionic hematoma also increases the risk of premature detachment of the placenta from the uterus, preterm (premature) labor, and stillbirth. °HOME CARE INSTRUCTIONS °· Stay on bed rest if your health care provider recommends this. Although bed rest will not prevent more bleeding or prevent a miscarriage, your health care provider may recommend bed rest until you are advised otherwise. °· Avoid heavy lifting (more than 10 lb [4.5 kg]), exercise, sexual intercourse, or douching as directed by your health care provider. °· Keep track of the number of pads you use each day and how soaked (saturated) they are. Write down this information. °· Do not use tampons. °· Keep all follow-up appointments as directed by your health care provider. Your health care provider may ask you to have follow-up blood tests or ultrasound tests or both. °SEEK IMMEDIATE MEDICAL CARE IF: °· You have severe cramps in your stomach, back, abdomen, or pelvis. °· You have a fever. °· You pass large clots or tissue. Save any tissue for your health  care provider to look at. °· Your bleeding increases or you become lightheaded, feel weak, or have fainting episodes. °  °This information is not intended to replace advice given to you by your health care provider. Make sure you discuss any questions you have with your health care provider. °  °Document Released: 06/16/2006 Document Revised: 03/22/2014 Document Reviewed: 09/28/2012 °[employer]vier Interactive Patient Education ©2016 [employer] ° °

## 2015-03-11 ENCOUNTER — Encounter: Payer: Self-pay | Admitting: *Deleted

## 2015-03-14 LAB — GC/CHLAMYDIA PROBE AMP (~~LOC~~) NOT AT ARMC
Chlamydia: NEGATIVE
Neisseria Gonorrhea: NEGATIVE

## 2015-03-16 NOTE — L&D Delivery Note (Signed)
  Delivery Note   Requested by Dr. Kennon Rounds to attend this repeat C-section delivery at [redacted] weeks GA due to previous C-section delivery   Born to a G5P2, negative GBS mother with [facility].  Past history included ectopic pregnancy, hypertension, abnormal PAP, hyperemesis, chest pain, and UTI. ROM occurred at delivery with clear fluid.   Infant vigorous with good spontaneous cry.  Routine NRP followed including warming, drying and stimulation.  Apgars 9 and 10.  Physical exam within normal limits.   Left in OR for skin-to-skin contact with mother, in care of CN staff.  Care transferred to Pediatrician.  Fairy A. Chana Bode, NNP-BC

## 2015-03-18 ENCOUNTER — Telehealth: Payer: Self-pay | Admitting: *Deleted

## 2015-03-18 ENCOUNTER — Ambulatory Visit (HOSPITAL_COMMUNITY)
Admission: RE | Admit: 2015-03-18 | Discharge: 2015-03-18 | Disposition: A | Discharge status: Home / Self Care | Payer: BLUE CROSS/BLUE SHIELD | Source: Ambulatory Visit | Attending: Advanced Practice Midwife | Admitting: Advanced Practice Midwife

## 2015-03-18 ENCOUNTER — Ambulatory Visit (INDEPENDENT_AMBULATORY_CARE_PROVIDER_SITE_OTHER): Payer: BLUE CROSS/BLUE SHIELD | Admitting: Family Medicine

## 2015-03-18 VITALS — BP 126/75 | HR 93 | Wt 247.0 lb

## 2015-03-18 DIAGNOSIS — Z3492 Encounter for supervision of normal pregnancy, unspecified, second trimester: Secondary | ICD-10-CM

## 2015-03-18 DIAGNOSIS — Z3A19 19 weeks gestation of pregnancy: Secondary | ICD-10-CM | POA: Insufficient documentation

## 2015-03-18 DIAGNOSIS — Z3482 Encounter for supervision of other normal pregnancy, second trimester: Secondary | ICD-10-CM

## 2015-03-18 DIAGNOSIS — O34219 Maternal care for unspecified type scar from previous cesarean delivery: Secondary | ICD-10-CM

## 2015-03-18 DIAGNOSIS — Z36 Encounter for antenatal screening of mother: Secondary | ICD-10-CM | POA: Diagnosis present

## 2015-03-18 NOTE — Telephone Encounter (Signed)
Updated problem list

## 2015-03-18 NOTE — Patient Instructions (Addendum)
Second Trimester of Pregnancy The second trimester is from week 13 through week 28, months 4 through 6. The second trimester is often a time when you feel your best. Your body has also adjusted to being pregnant, and you begin to feel better physically. Usually, morning sickness has lessened or quit completely, you may have more energy, and you may have an increase in appetite. The second trimester is also a time when the fetus is growing rapidly. At the end of the sixth month, the fetus is about 9 inches long and weighs about 1 pounds. You will likely begin to feel the baby move (quickening) between 18 and 20 weeks of the pregnancy. BODY CHANGES Your body goes through many changes during pregnancy. The changes vary from woman to woman.   Your weight will continue to increase. You will notice your lower abdomen bulging out.  You may begin to get stretch marks on your hips, abdomen, and breasts.  You may develop headaches that can be relieved by medicines approved by your health care provider.  You may urinate more often because the fetus is pressing on your bladder.  You may develop or continue to have heartburn as a result of your pregnancy.  You may develop constipation because certain hormones are causing the muscles that push waste through your intestines to slow down.  You may develop hemorrhoids or swollen, bulging veins (varicose veins).  You may have back pain because of the weight gain and pregnancy hormones relaxing your joints between the bones in your pelvis and as a result of a shift in weight and the muscles that support your balance.  Your breasts will continue to grow and be tender.  Your gums may bleed and may be sensitive to brushing and flossing.  Dark spots or blotches (chloasma, mask of pregnancy) may develop on your face. This will likely fade after the baby is born.  A dark line from your belly button to the pubic area (linea nigra) may appear. This will likely  fade after the baby is born.  You may have changes in your hair. These can include thickening of your hair, rapid growth, and changes in texture. Some women also have hair loss during or after pregnancy, or hair that feels dry or thin. Your hair will most likely return to normal after your baby is born. WHAT TO EXPECT AT YOUR PRENATAL VISITS During a routine prenatal visit:  You will be weighed to make sure you and the fetus are growing normally.  Your blood pressure will be taken.  Your abdomen will be measured to track your baby's growth.  The fetal heartbeat will be listened to.  Any test results from the previous visit will be discussed. Your health care provider may ask you:  How you are feeling.  If you are feeling the baby move.  If you have had any abnormal symptoms, such as leaking fluid, bleeding, severe headaches, or abdominal cramping.  If you are using any tobacco products, including cigarettes, chewing tobacco, and electronic cigarettes.  If you have any questions. Other tests that may be performed during your second trimester include:  Blood tests that check for:  Low iron levels (anemia).  Gestational diabetes (between 24 and 28 weeks).  Rh antibodies.  Urine tests to check for infections, diabetes, or protein in the urine.  An ultrasound to confirm the proper growth and development of the baby.  An amniocentesis to check for possible genetic problems.  Fetal screens for spina bifida   and Down syndrome.  HIV (human immunodeficiency virus) testing. Routine prenatal testing includes screening for HIV, unless you choose not to have this test. HOME CARE INSTRUCTIONS   Avoid all smoking, herbs, alcohol, and unprescribed drugs. These chemicals affect the formation and growth of the baby.  Do not use any tobacco products, including cigarettes, chewing tobacco, and electronic cigarettes. If you need help quitting, ask your health care provider. You may receive  counseling support and other resources to help you quit.  Follow your health care provider's instructions regarding medicine use. There are medicines that are either safe or unsafe to take during pregnancy.  Exercise only as directed by your health care provider. Experiencing uterine cramps is a good sign to stop exercising.  Continue to eat regular, healthy meals.  Wear a good support bra for breast tenderness.  Do not use hot tubs, steam rooms, or saunas.  Wear your seat belt at all times when driving.  Avoid raw meat, uncooked cheese, cat litter boxes, and soil used by cats. These carry germs that can cause birth defects in the baby.  Take your prenatal vitamins.  Take 1500-2000 mg of calcium daily starting at the 20th week of pregnancy until you deliver your baby.  Try taking a stool softener (if your health care provider approves) if you develop constipation. Eat more high-fiber foods, such as fresh vegetables or fruit and whole grains. Drink plenty of fluids to keep your urine clear or pale yellow.  Take warm sitz baths to soothe any pain or discomfort caused by hemorrhoids. Use hemorrhoid cream if your health care provider approves.  If you develop varicose veins, wear support hose. Elevate your feet for 15 minutes, 3-4 times a day. Limit salt in your diet.  Avoid heavy lifting, wear low heel shoes, and practice good posture.  Rest with your legs elevated if you have leg cramps or low back pain.  Visit your dentist if you have not gone yet during your pregnancy. Use a soft toothbrush to brush your teeth and be gentle when you floss.  A sexual relationship may be continued unless your health care provider directs you otherwise.  Continue to go to all your prenatal visits as directed by your health care provider. SEEK MEDICAL CARE IF:   You have dizziness.  You have mild pelvic cramps, pelvic pressure, or nagging pain in the abdominal area.  You have persistent nausea,  vomiting, or diarrhea.  You have a bad smelling vaginal discharge.  You have pain with urination. SEEK IMMEDIATE MEDICAL CARE IF:   You have a fever.  You are leaking fluid from your vagina.  You have spotting or bleeding from your vagina.  You have severe abdominal cramping or pain.  You have rapid weight gain or loss.  You have shortness of breath with chest pain.  You notice sudden or extreme swelling of your face, hands, ankles, feet, or legs.  You have not felt your baby move in over an hour.  You have severe headaches that do not go away with medicine.  You have vision changes.   This information is not intended to replace advice given to you by your health care provider. Make sure you discuss any questions you have with your health care provider.   Document Released: 02/23/2001 Document Revised: 03/22/2014 Document Reviewed: 05/02/2012 Elsevier Interactive Patient Education 2016 [employer]   Breastfeeding Deciding to breastfeed is one of the best choices you can make for you and your baby. A change   in hormones during pregnancy causes your breast tissue to grow and increases the number and size of your milk ducts. These hormones also allow proteins, sugars, and fats from your blood supply to make breast milk in your milk-producing glands. Hormones prevent breast milk from being released before your baby is born as well as prompt milk flow after birth. Once breastfeeding has begun, thoughts of your baby, as well as his or her sucking or crying, can stimulate the release of milk from your milk-producing glands.  BENEFITS OF BREASTFEEDING For Your Baby  Your first milk (colostrum) helps your baby's digestive system function better.  There are antibodies in your milk that help your baby fight off infections.  Your baby has a lower incidence of asthma, allergies, and sudden infant death syndrome.  The nutrients in breast milk are better for your baby than infant  formulas and are designed uniquely for your baby's needs.  Breast milk improves your baby's brain development.  Your baby is less likely to develop other conditions, such as childhood obesity, asthma, or type 2 diabetes mellitus. For You  Breastfeeding helps to create a very special bond between you and your baby.  Breastfeeding is convenient. Breast milk is always available at the correct temperature and costs nothing.  Breastfeeding helps to burn calories and helps you lose the weight gained during pregnancy.  Breastfeeding makes your uterus contract to its prepregnancy size faster and slows bleeding (lochia) after you give birth.   Breastfeeding helps to lower your risk of developing type 2 diabetes mellitus, osteoporosis, and breast or ovarian cancer later in life. SIGNS THAT YOUR BABY IS HUNGRY Early Signs of Hunger  Increased alertness or activity.  Stretching.  Movement of the head from side to side.  Movement of the head and opening of the mouth when the corner of the mouth or cheek is stroked (rooting).  Increased sucking sounds, smacking lips, cooing, sighing, or squeaking.  Hand-to-mouth movements.  Increased sucking of fingers or hands. Late Signs of Hunger  Fussing.  Intermittent crying. Extreme Signs of Hunger Signs of extreme hunger will require calming and consoling before your baby will be able to breastfeed successfully. Do not wait for the following signs of extreme hunger to occur before you initiate breastfeeding:  Restlessness.  A loud, strong cry.  Screaming. BREASTFEEDING BASICS Breastfeeding Initiation  Find a comfortable place to sit or lie down, with your neck and back well supported.  Place a pillow or rolled up blanket under your baby to bring him or her to the level of your breast (if you are seated). Nursing pillows are specially designed to help support your arms and your baby while you breastfeed.  Make sure that your baby's  abdomen is facing your abdomen.  Gently massage your breast. With your fingertips, massage from your chest wall toward your nipple in a circular motion. This encourages milk flow. You may need to continue this action during the feeding if your milk flows slowly.  Support your breast with 4 fingers underneath and your thumb above your nipple. Make sure your fingers are well away from your nipple and your baby's mouth.  Stroke your baby's lips gently with your finger or nipple.  When your baby's mouth is open wide enough, quickly bring your baby to your breast, placing your entire nipple and as much of the colored area around your nipple (areola) as possible into your baby's mouth.  More areola should be visible above your baby's upper lip than   below the lower lip.  Your baby's tongue should be between his or her lower gum and your breast.  Ensure that your baby's mouth is correctly positioned around your nipple (latched). Your baby's lips should create a seal on your breast and be turned out (everted).  It is common for your baby to suck about 2-3 minutes in order to start the flow of breast milk. Latching Teaching your baby how to latch on to your breast properly is very important. An improper latch can cause nipple pain and decreased milk supply for you and poor weight gain in your baby. Also, if your baby is not latched onto your nipple properly, he or she may swallow some air during feeding. This can make your baby fussy. Burping your baby when you switch breasts during the feeding can help to get rid of the air. However, teaching your baby to latch on properly is still the best way to prevent fussiness from swallowing air while breastfeeding. Signs that your baby has successfully latched on to your nipple:  Silent tugging or silent sucking, without causing you pain.  Swallowing heard between every 3-4 sucks.  Muscle movement above and in front of his or her ears while sucking. Signs  that your baby has not successfully latched on to nipple:  Sucking sounds or smacking sounds from your baby while breastfeeding.  Nipple pain. If you think your baby has not latched on correctly, slip your finger into the corner of your baby's mouth to break the suction and place it between your baby's gums. Attempt breastfeeding initiation again. Signs of Successful Breastfeeding Signs from your baby:  A gradual decrease in the number of sucks or complete cessation of sucking.  Falling asleep.  Relaxation of his or her body.  Retention of a small amount of milk in his or her mouth.  Letting go of your breast by himself or herself. Signs from you:  Breasts that have increased in firmness, weight, and size 1-3 hours after feeding.  Breasts that are softer immediately after breastfeeding.  Increased milk volume, as well as a change in milk consistency and color by the fifth day of breastfeeding.  Nipples that are not sore, cracked, or bleeding. Signs That Your Baby is Getting Enough Milk  Wetting at least 3 diapers in a 24-hour period. The urine should be clear and pale yellow by age 5 days.  At least 3 stools in a 24-hour period by age 5 days. The stool should be soft and yellow.  At least 3 stools in a 24-hour period by age 7 days. The stool should be seedy and yellow.  No loss of weight greater than 10% of birth weight during the first 3 days of age.  Average weight gain of 4-7 ounces (113-198 g) per week after age 4 days.  Consistent daily weight gain by age 5 days, without weight loss after the age of 2 weeks. After a feeding, your baby may spit up a small amount. This is common. BREASTFEEDING FREQUENCY AND DURATION Frequent feeding will help you make more milk and can prevent sore nipples and breast engorgement. Breastfeed when you feel the need to reduce the fullness of your breasts or when your baby shows signs of hunger. This is called "breastfeeding on demand." Avoid  introducing a pacifier to your baby while you are working to establish breastfeeding (the first 4-6 weeks after your baby is born). After this time you may choose to use a pacifier. Research has shown that   pacifier use during the first year of a baby's life decreases the risk of sudden infant death syndrome (SIDS). Allow your baby to feed on each breast as long as he or she wants. Breastfeed until your baby is finished feeding. When your baby unlatches or falls asleep while feeding from the first breast, offer the second breast. Because newborns are often sleepy in the first few weeks of life, you may need to awaken your baby to get him or her to feed. Breastfeeding times will vary from baby to baby. However, the following rules can serve as a guide to help you ensure that your baby is properly fed:  Newborns (babies 4 weeks of age or younger) may breastfeed every 1-3 hours.  Newborns should not go longer than 3 hours during the day or 5 hours during the night without breastfeeding.  You should breastfeed your baby a minimum of 8 times in a 24-hour period until you begin to introduce solid foods to your baby at around 6 months of age. BREAST MILK PUMPING Pumping and storing breast milk allows you to ensure that your baby is exclusively fed your breast milk, even at times when you are unable to breastfeed. This is especially important if you are going back to work while you are still breastfeeding or when you are not able to be present during feedings. Your lactation consultant can give you guidelines on how long it is safe to store breast milk. A breast pump is a machine that allows you to pump milk from your breast into a sterile bottle. The pumped breast milk can then be stored in a refrigerator or freezer. Some breast pumps are operated by hand, while others use electricity. Ask your lactation consultant which type will work best for you. Breast pumps can be purchased, but some hospitals and  breastfeeding support groups lease breast pumps on a monthly basis. A lactation consultant can teach you how to hand express breast milk, if you prefer not to use a pump. CARING FOR YOUR BREASTS WHILE YOU BREASTFEED Nipples can become dry, cracked, and sore while breastfeeding. The following recommendations can help keep your breasts moisturized and healthy:  Avoid using soap on your nipples.  Wear a supportive bra. Although not required, special nursing bras and tank tops are designed to allow access to your breasts for breastfeeding without taking off your entire bra or top. Avoid wearing underwire-style bras or extremely tight bras.  Air dry your nipples for 3-4minutes after each feeding.  Use only cotton bra pads to absorb leaked breast milk. Leaking of breast milk between feedings is normal.  Use lanolin on your nipples after breastfeeding. Lanolin helps to maintain your skin's normal moisture barrier. If you use pure lanolin, you do not need to wash it off before feeding your baby again. Pure lanolin is not toxic to your baby. You may also hand express a few drops of breast milk and gently massage that milk into your nipples and allow the milk to air dry. In the first few weeks after giving birth, some women experience extremely full breasts (engorgement). Engorgement can make your breasts feel heavy, warm, and tender to the touch. Engorgement peaks within 3-5 days after you give birth. The following recommendations can help ease engorgement:  Completely empty your breasts while breastfeeding or pumping. You may want to start by applying warm, moist heat (in the shower or with warm water-soaked hand towels) just before feeding or pumping. This increases circulation and helps the milk   If your baby does not completely empty your breasts while breastfeeding, pump any extra milk after he or she is finished.  Wear a snug bra (nursing or regular) or tank top for 1-2 days to signal your body  to slightly decrease milk production.  Apply ice packs to your breasts, unless this is too uncomfortable for you.  Make sure that your baby is latched on and positioned properly while breastfeeding. If engorgement persists after 48 hours of following these recommendations, contact your health care provider or a Science writer. OVERALL HEALTH CARE RECOMMENDATIONS WHILE BREASTFEEDING  Eat healthy foods. Alternate between meals and snacks, eating 3 of each per day. Because what you eat affects your breast milk, some of the foods may make your baby more irritable than usual. Avoid eating these foods if you are sure that they are negatively affecting your baby.  Drink milk, fruit juice, and water to satisfy your thirst (about 10 glasses a day).  Rest often, relax, and continue to take your prenatal vitamins to prevent fatigue, stress, and anemia.  Continue breast self-awareness checks.  Avoid chewing and smoking tobacco. Chemicals from cigarettes that pass into breast milk and exposure to secondhand smoke may harm your baby.  Avoid alcohol and drug use, including marijuana. Some medicines that may be harmful to your baby can pass through breast milk. It is important to ask your health care provider before taking any medicine, including all over-the-counter and prescription medicine as well as vitamin and herbal supplements. It is possible to become pregnant while breastfeeding. If birth control is desired, ask your health care provider about options that will be safe for your baby. SEEK MEDICAL CARE IF:  You feel like you want to stop breastfeeding or have become frustrated with breastfeeding.  You have painful breasts or nipples.  Your nipples are cracked or bleeding.  Your breasts are red, tender, or warm.  You have a swollen area on either breast.  You have a fever or chills.  You have nausea or vomiting.  You have drainage other than breast milk from your nipples.  Your  breasts do not become full before feedings by the fifth day after you give birth.  You feel sad and depressed.  Your baby is too sleepy to eat well.  Your baby is having trouble sleeping.   Your baby is wetting less than 3 diapers in a 24-hour period.  Your baby has less than 3 stools in a 24-hour period.  Your baby's skin or the white part of his or her eyes becomes yellow.   Your baby is not gaining weight by 59 days of age. SEEK IMMEDIATE MEDICAL CARE IF:  Your baby is overly tired (lethargic) and does not want to wake up and feed.  Your baby develops an unexplained fever.   This information is not intended to replace advice given to you by your health care provider. Make sure you discuss any questions you have with your health care provider.   Document Released: 03/01/2005 Document Revised: 11/20/2014 Document Reviewed: 08/23/2012 Elsevier Interactive Patient Education 2016 [place] After Cesarean Delivery Information A trial of labor after cesarean delivery (TOLAC) is when a woman tries to give birth vaginally after a previous cesarean delivery. TOLAC may be a safe and appropriate option for you depending on your medical history and other risk factors. When TOLAC is successful and you are able to have a vaginal delivery, this is called a vaginal birth after cesarean delivery (VBAC).  CANDIDATES FOR TOLAC TOLAC is possible for some women who:  Have undergone one or two prior cesarean deliveries in which the incision of the uterus was horizontal (low transverse).  Are carrying twins and have had one prior low transverse incision during a cesarean delivery.  Do not have a vertical (classical) uterine scar.  Have not had a tear in the wall of their uterus (uterine rupture). TOLAC is also supported for women who meet appropriate criteria and:  Are under the age of 71 years.  Are tall and have a body mass index (BMI) of less than 30.  Have an unknown  uterine scar.  Give birth in a facility equipped to handle an emergency cesarean delivery. This team should be able to handle possible complications such as a uterine rupture.  Have thorough counseling about the benefits and risks of TOLAC.  Have discussed future pregnancy plans with their health care provider.  Plan to have several more pregnancies. MOST SUCCESSFUL CANDIDATES FOR TOLAC:  Have had a successful vaginal delivery before or after their cesarean delivery.  Experience labor that begins naturally on or before the due date (40 weeks of gestation).  Do not have a very large (macrosomic) baby.   Had a prior cesarean delivery but are not currently experiencing factors that would prompt a cesarean delivery (such as a breech position).  Had only one prior cesarean delivery.  Had a prior cesarean delivery that was performed early in labor and not after full cervical dilation. TOLAC may be most appropriate for women who meet the above guidelines and who plan to have more pregnancies. TOLAC is not recommended for home births. LEAST SUCCESSFUL CANDIDATES FOR TOLAC:  Have an induced labor with an unfavorable cervix. An unfavorable cervix is when the cervix is not dilating enough (among other factors).  Have never had a vaginal delivery.  Have had more than two cesarean deliveries.  Have a pregnancy at more than 40 weeks of gestation.  Are pregnant with a baby with a suspected weight greater than 4,000 grams (8 pounds) and who have no prior history of a vaginal delivery.  Have closely spaced pregnancies. SUGGESTED BENEFITS OF TOLAC  You may have a faster recovery time.  You may have a shorter stay in the hospital.  You may have less pain and fewer problems than with a cesarean delivery. Women who have a cesarean delivery have a higher chance of needing blood or getting a fever, an infection, or a blood clot in the legs. SUGGESTED RISKS OF TOLAC The highest risk of  complications happens to women who attempt a TOLAC and fail. A failed TOLAC results in an unplanned cesarean delivery. Risks related to [facility] or repeat cesarean deliveries include:   Blood loss.  Infection.  Blood clot.  Injury to surrounding tissues or organs.  Having to remove the uterus (hysterectomy).  Potential problems with the placenta (such as placenta previa or placenta accreta) in future pregnancies. Although very rare, the main concerns with TOLAC are:  Rupture of the uterine scar from a past cesarean delivery.  Needing an emergency cesarean delivery.  Having a bad outcome for the baby (perinatal morbidity). FOR MORE INFORMATION [employer]: www.acog.[place]: www.midwife.org   This information is not intended to replace advice given to you by your health care provider. Make sure you discuss any questions you have with your health care provider.   Document Released: 11/17/2010 Document Revised: 12/20/2012 Document Reviewed: 08/21/2012 Elsevier  [employer] Patient Education 2016 [employer]  

## 2015-03-18 NOTE — Progress Notes (Signed)
Subjective:  Ann Brock is a 30 y.o. [id number] at [redacted]w[redacted]d being seen today for ongoing prenatal care.  She is currently monitored for the following issues for this high-risk pregnancy and has Neck pain; Elevated blood pressure; History of ectopic pregnancy; Supervision of normal pregnancy; Chronic hypertension in pregnancy; and History of cesarean delivery, currently pregnant on her problem list.  Patient reports bleeding and noted to have [facility] 12/28.Marland Kitchen  Contractions: Not present. Vag. Bleeding: Small.  Movement: Present. Denies leaking of fluid.   The following portions of the patient's history were reviewed and updated as appropriate: allergies, current medications, past family history, past medical history, past social history, past surgical history and problem list. Problem list updated.  Objective:   Filed Vitals:   03/18/15 1336  BP: 126/75  Pulse: 93  Weight: 247 lb (112.038 kg)    Fetal Status: Fetal Heart Rate (bpm): 146 Fundal Height: 21 cm Movement: Present     General:  Alert, oriented and cooperative. Patient is in no acute distress.  Skin: Skin is warm and dry. No rash noted.   Cardiovascular: Normal heart rate noted  Respiratory: Normal respiratory effort, no problems with respiration noted  Abdomen: Soft, gravid, appropriate for gestational age. Pain/Pressure: Present     Pelvic: Vag. Bleeding: Small Vag D/C Character: Thick   Cervical exam deferred        Extremities: Normal range of motion.  Edema: None  Mental Status: Normal mood and affect. Normal behavior. Normal judgment and thought content.   Urinalysis: Urine Protein: Trace Urine Glucose: Negative  Assessment and Plan:  Pregnancy: [id number] at [redacted]w[redacted]d  1. Supervision of normal pregnancy, second trimester Continue routine prenatal care. Had anatomy US today-results not available as yet Discussed TOLAC vs. RCS May return to work with pregnancy limitations.  Preterm labor symptoms and general obstetric  precautions including but not limited to vaginal bleeding, contractions, leaking of fluid and fetal movement were reviewed in detail with the patient. Please refer to After Visit Summary for other counseling recommendations.  Return in 4 weeks (on 04/15/2015).   Donnamae Jude, MD

## 2015-03-18 NOTE — Progress Notes (Signed)
Has subchorionic hemorrhage

## 2015-03-20 ENCOUNTER — Other Ambulatory Visit (INDEPENDENT_AMBULATORY_CARE_PROVIDER_SITE_OTHER): Payer: BLUE CROSS/BLUE SHIELD

## 2015-03-20 VITALS — BP 131/83 | HR 88 | Temp 98.4°F | Wt 247.0 lb

## 2015-03-20 DIAGNOSIS — R319 Hematuria, unspecified: Secondary | ICD-10-CM | POA: Diagnosis not present

## 2015-03-20 LAB — POCT URINALYSIS DIPSTICK
Bilirubin, UA: NEGATIVE
GLUCOSE UA: NEGATIVE
Ketones, UA: NEGATIVE
NITRITE UA: NEGATIVE
UROBILINOGEN UA: NEGATIVE
pH, UA: 7

## 2015-03-24 ENCOUNTER — Telehealth: Payer: Self-pay | Admitting: *Deleted

## 2015-03-24 DIAGNOSIS — N390 Urinary tract infection, site not specified: Secondary | ICD-10-CM

## 2015-03-24 LAB — CULTURE, URINE COMPREHENSIVE: Colony Count: 75000

## 2015-03-24 MED ORDER — NITROFURANTOIN MONOHYD MACRO 100 MG PO CAPS: 100.0000 mg | ORAL_CAPSULE | Freq: Two times a day (BID) | ORAL | Status: DC

## 2015-03-24 NOTE — Telephone Encounter (Signed)
Pt notified of UTI and per VO Dr Hulan Fray prescribed Macrobid BID for 10 days.  This was sent to pt's pharmacy.

## 2015-03-26 NOTE — Telephone Encounter (Signed)
Called pt to adv UTI but pt is currently taking Amoxicillin for tooth infection. Adv pt I will get with provider to see if Amox will clear UTI or if something else needs to be prescribed. Pt states Amox is doing nothing for UTI.

## 2015-03-31 ENCOUNTER — Encounter: Payer: BLUE CROSS/BLUE SHIELD | Admitting: Certified Nurse Midwife

## 2015-04-14 ENCOUNTER — Ambulatory Visit (INDEPENDENT_AMBULATORY_CARE_PROVIDER_SITE_OTHER): Payer: BLUE CROSS/BLUE SHIELD | Admitting: Advanced Practice Midwife

## 2015-04-14 ENCOUNTER — Encounter: Payer: Self-pay | Admitting: *Deleted

## 2015-04-14 VITALS — BP 132/69 | HR 84 | Wt 252.0 lb

## 2015-04-14 DIAGNOSIS — Z3482 Encounter for supervision of other normal pregnancy, second trimester: Secondary | ICD-10-CM

## 2015-04-14 DIAGNOSIS — Z3492 Encounter for supervision of normal pregnancy, unspecified, second trimester: Secondary | ICD-10-CM

## 2015-04-14 DIAGNOSIS — R350 Frequency of micturition: Secondary | ICD-10-CM | POA: Diagnosis not present

## 2015-04-14 DIAGNOSIS — O34219 Maternal care for unspecified type scar from previous cesarean delivery: Secondary | ICD-10-CM

## 2015-04-14 LAB — GLUCOSE, POCT (MANUAL RESULT ENTRY): POC Glucose: 87 mg/dl (ref 70–99)

## 2015-04-14 NOTE — Patient Instructions (Signed)
Cesarean Delivery Cesarean delivery is the birth of a baby through a cut (incision) in the abdomen and womb (uterus).  LET YOUR HEALTH CARE PROVIDER KNOW ABOUT:  All medicines you are taking, including vitamins, herbs, eye drops, creams, and over-the-counter medicines.  Previous problems you or members of your family have had with the use of anesthetics.  Any bleeding or blood clotting disorders you have.  Family history of blood clots or bleeding disorders.  Any history of deep vein thrombosis (DVT) or pulmonary embolism (PE).  Previous surgeries you have had.  Medical conditions you have.  Any allergies you have.  Complicationsinvolving the pregnancy. RISKS AND COMPLICATIONS  Generally, this is a safe procedure. However, as with any procedure, complications can occur. Possible complications include:  Bleeding.  Infection.  Blood clots.  Injury to surrounding organs.  Problems with anesthesia.  Injury to the baby. BEFORE THE PROCEDURE   You may be given an antacid medicine to drink. This will prevent acid contents in your stomach from going into your lungs if you vomit during the surgery.  You may be given an antibiotic medicine to prevent infection. PROCEDURE   To prevent infection of your incision:  Hair may be removed from your pubic area if it is near your incision.  The skin of your pubic area and lower abdomen will be cleaned with a germ-killing solution (antiseptic).  A tube (Foley catheter) will be placed in your bladder to drain your urine from your bladder into a bag. This keeps your bladder empty during surgery.  An IV tube will be placed in your vein.  You may be given medicine to numb the lower half of your body (regional anesthetic). If you were in labor, you may have already had an epidural in place which can be used in both labor and cesarean delivery. You may possibly be given medicine to make you sleep (general anesthetic) though this is not as  common.  Your heart rate and your baby's heart rate will be monitored.  An incision will be made in your abdomen that extends to your uterus. There are 2 basic kinds of incisions:  The horizontal (transverse) incision. Horizontal incisions are from side to side and are used for most routine cesarean deliveries.  The vertical incision. The vertical incision is from the top of the abdomen to the bottom and is less commonly used. It is often done for women who have a serious complication (extreme prematurity) or under emergency situations.  The horizontal and vertical incisions may both be used at the same time. However, this is very uncommon.  An incision is then made in your uterus to deliver the baby.  Your baby will be delivered.  Your health care provider may place the baby on your chest. It is important to keep the baby warm. Your health care provider will dry off the baby, place the baby directly on your bare skin, and cover the baby with warm, dry blankets.  Both incisions will be closed with absorbable stitches. AFTER THE PROCEDURE   If you were awake during the surgery, you will see your baby right away. If you were asleep, you will see your baby as soon as you are awake.  You may breastfeed your baby after surgery.  You may be able to get up and walk the same day as the surgery. If you need to stay in bed for a period of time, you will receive help to turn, cough, and take deep breaths after   surgery. This helps prevent lung problems such as pneumonia.  Do not get out of bed alone the first time after surgery. You will need help getting out of bed until you are able to do this by yourself.  You may be able to shower the day after your cesarean delivery. After the bandage (dressing) is taken off the incision site, a nurse will assist you to shower if you would like help.  You may be directed to take actions to help prevent blood clots in your legs. These may  include:  Walking shortly after surgery, with someone assisting you. Moving around after surgery helps to improve blood flow.  Wearing compression stockings or using different types of devices.  Taking medicines to thin your blood (anticoagulants) if you are at high risk for DVT or PE.  Save any blood clots that you pass from your vagina. If you pass a clot while on the toilet, do not flush it. Call for the nurse. Tell the nurse if you think you are bleeding too much or passing too many clots.  You will be given medicine for pain and nausea as needed. Let your health care providers know if you are hurting. You may also be given an antibiotic to prevent an infection.  Your IV tube will be taken out when you are drinking a reasonable amount of fluids. The Foley catheter is taken out when you are up and walking.  If your blood type is Rh negative and your baby's blood type is Rh positive, you will be given a shot of anti-D immune globulin. This shot prevents you from having Rh problems with a future pregnancy. You should get the shot even if you had your tubes tied (tubal ligation).  If you are allowed to take the baby for a walk, place the baby in the bassinet and push it.   This information is not intended to replace advice given to you by your health care provider. Make sure you discuss any questions you have with your health care provider.   Document Released: 03/01/2005 Document Revised: 11/20/2014 Document Reviewed: 10/27/2011 Elsevier Interactive Patient Education 2016 [employer] Inc.  

## 2015-04-14 NOTE — Progress Notes (Signed)
Subjective:  Ann Brock is a 30 y.o. [id number] at [redacted]w[redacted]d being seen today for ongoing prenatal care.  She is currently monitored for the following issues for this low-risk pregnancy and has Neck pain; Elevated blood pressure; History of ectopic pregnancy; Supervision of normal pregnancy; Chronic hypertension in pregnancy; and History of cesarean delivery, currently pregnant on her problem list.  Patient reports ongoing spotting, urinary frequency. .  Contractions: Not present. Vag. Bleeding: None.  Movement: Present. Denies leaking of fluid.   The following portions of the patient's history were reviewed and updated as appropriate: allergies, current medications, past family history, past medical history, past social history, past surgical history and problem list. Problem list updated.  Objective:   Filed Vitals:   04/14/15 1004  BP: 132/69  Pulse: 84  Weight: 252 lb (114.306 kg)    Fetal Status: Fetal Heart Rate (bpm): 145   Movement: Present     General:  Alert, oriented and cooperative. Patient is in no acute distress.  Skin: Skin is warm and dry. No rash noted.   Cardiovascular: Normal heart rate noted  Respiratory: Normal respiratory effort, no problems with respiration noted  Abdomen: Soft, gravid, appropriate for gestational age. Pain/Pressure: Present     Pelvic: Vag. Bleeding: None Vag D/C Character: Thin   Cervical exam deferred        Extremities: Normal range of motion.  Edema: None  Mental Status: Normal mood and affect. Normal behavior. Normal judgment and thought content.   Urinalysis: Urine Protein: Trace Urine Glucose: Negative  Assessment and Plan:  Pregnancy: [id number] at [redacted]w[redacted]d  1. Urinary frequency  - POCT Glucose (CBG) - Culture, OB Urine  2. Supervision of normal pregnancy in second trimester  - [place] MFM OB FOLLOW UP; Future  3. Supervision of normal pregnancy, second trimester   4. History of cesarean delivery, currently pregnant -Plans repeat C/S  w/ BTL. Consent signed.    5. [facility]  Preterm labor symptoms and general obstetric precautions including but not limited to vaginal bleeding, contractions, leaking of fluid and fetal movement were reviewed in detail with the patient. Please refer to After Visit Summary for other counseling recommendations.  Return in about 4 weeks (around 05/12/2015) for ROB/GTT.   Manya Silvas, CNM

## 2015-04-14 NOTE — Progress Notes (Signed)
Pt c/o's having to void @ least 10 times during the night.  She states that she doesn't drink that much during the day.  Early glucola was not obtained.  POCT CBG- 87

## 2015-04-17 ENCOUNTER — Other Ambulatory Visit: Payer: Self-pay | Admitting: Advanced Practice Midwife

## 2015-04-17 ENCOUNTER — Ambulatory Visit (HOSPITAL_COMMUNITY)
Admission: RE | Admit: 2015-04-17 | Discharge: 2015-04-17 | Disposition: A | Discharge status: Home / Self Care | Payer: BLUE CROSS/BLUE SHIELD | Source: Ambulatory Visit | Attending: Advanced Practice Midwife | Admitting: Advanced Practice Midwife

## 2015-04-17 DIAGNOSIS — Z36 Encounter for antenatal screening of mother: Secondary | ICD-10-CM | POA: Insufficient documentation

## 2015-04-17 DIAGNOSIS — Z3A23 23 weeks gestation of pregnancy: Secondary | ICD-10-CM

## 2015-04-17 DIAGNOSIS — O10013 Pre-existing essential hypertension complicating pregnancy, third trimester: Secondary | ICD-10-CM | POA: Diagnosis not present

## 2015-04-17 DIAGNOSIS — Z0489 Encounter for examination and observation for other specified reasons: Secondary | ICD-10-CM

## 2015-04-17 DIAGNOSIS — O34219 Maternal care for unspecified type scar from previous cesarean delivery: Secondary | ICD-10-CM | POA: Insufficient documentation

## 2015-04-17 DIAGNOSIS — O99212 Obesity complicating pregnancy, second trimester: Secondary | ICD-10-CM | POA: Insufficient documentation

## 2015-04-17 DIAGNOSIS — O4692 Antepartum hemorrhage, unspecified, second trimester: Secondary | ICD-10-CM

## 2015-04-17 DIAGNOSIS — IMO0002 Reserved for concepts with insufficient information to code with codable children: Secondary | ICD-10-CM

## 2015-04-17 DIAGNOSIS — Z3492 Encounter for supervision of normal pregnancy, unspecified, second trimester: Secondary | ICD-10-CM

## 2015-04-18 LAB — CULTURE, OB URINE

## 2015-04-21 ENCOUNTER — Telehealth: Payer: Self-pay | Admitting: *Deleted

## 2015-04-21 ENCOUNTER — Other Ambulatory Visit: Payer: Self-pay | Admitting: Advanced Practice Midwife

## 2015-04-21 DIAGNOSIS — O2342 Unspecified infection of urinary tract in pregnancy, second trimester: Secondary | ICD-10-CM

## 2015-04-21 MED ORDER — SULFAMETHOXAZOLE-TRIMETHOPRIM 800-160 MG PO TABS: 1.0000 | ORAL_TABLET | Freq: Two times a day (BID) | ORAL | Status: DC

## 2015-04-21 NOTE — Telephone Encounter (Signed)
Pt notified of Urine culture results and Marlou Porch, CNM has sent in a RX for Antibiotic to her pharmacy

## 2015-04-21 NOTE — Progress Notes (Signed)
Dx UTI. Rx Bactrim DS.

## 2015-05-07 ENCOUNTER — Encounter (HOSPITAL_BASED_OUTPATIENT_CLINIC_OR_DEPARTMENT_OTHER): Payer: Self-pay | Admitting: *Deleted

## 2015-05-07 ENCOUNTER — Emergency Department (HOSPITAL_BASED_OUTPATIENT_CLINIC_OR_DEPARTMENT_OTHER): Payer: BLUE CROSS/BLUE SHIELD

## 2015-05-07 ENCOUNTER — Emergency Department (HOSPITAL_BASED_OUTPATIENT_CLINIC_OR_DEPARTMENT_OTHER)
Admission: EM | Admit: 2015-05-07 | Discharge: 2015-05-07 | Disposition: A | Discharge status: Home / Self Care | Payer: BLUE CROSS/BLUE SHIELD | Attending: Emergency Medicine | Admitting: Emergency Medicine

## 2015-05-07 DIAGNOSIS — Z792 Long term (current) use of antibiotics: Secondary | ICD-10-CM | POA: Insufficient documentation

## 2015-05-07 DIAGNOSIS — Z79899 Other long term (current) drug therapy: Secondary | ICD-10-CM | POA: Diagnosis not present

## 2015-05-07 DIAGNOSIS — I10 Essential (primary) hypertension: Secondary | ICD-10-CM | POA: Diagnosis not present

## 2015-05-07 DIAGNOSIS — R079 Chest pain, unspecified: Secondary | ICD-10-CM

## 2015-05-07 DIAGNOSIS — O9989 Other specified diseases and conditions complicating pregnancy, childbirth and the puerperium: Secondary | ICD-10-CM | POA: Insufficient documentation

## 2015-05-07 DIAGNOSIS — R0789 Other chest pain: Secondary | ICD-10-CM | POA: Insufficient documentation

## 2015-05-07 DIAGNOSIS — Z3A27 27 weeks gestation of pregnancy: Secondary | ICD-10-CM | POA: Insufficient documentation

## 2015-05-07 DIAGNOSIS — R05 Cough: Secondary | ICD-10-CM

## 2015-05-07 DIAGNOSIS — Z8619 Personal history of other infectious and parasitic diseases: Secondary | ICD-10-CM | POA: Diagnosis not present

## 2015-05-07 DIAGNOSIS — Z87891 Personal history of nicotine dependence: Secondary | ICD-10-CM | POA: Diagnosis not present

## 2015-05-07 DIAGNOSIS — R059 Cough, unspecified: Secondary | ICD-10-CM

## 2015-05-07 HISTORY — DX: Chest pain, unspecified: R07.9

## 2015-05-07 LAB — CBC WITH DIFFERENTIAL/PLATELET
BASOS ABS: 0 10*3/uL (ref 0.0–0.1)
BASOS PCT: 0 %
Eosinophils Absolute: 0.1 10*3/uL (ref 0.0–0.7)
Eosinophils Relative: 1 %
HEMATOCRIT: 35.2 % — AB (ref 36.0–46.0)
HEMOGLOBIN: 11.6 g/dL — AB (ref 12.0–15.0)
Lymphocytes Relative: 15 %
Lymphs Abs: 1.2 10*3/uL (ref 0.7–4.0)
MCH: 28.4 pg (ref 26.0–34.0)
MCHC: 33 g/dL (ref 30.0–36.0)
MCV: 86.1 fL (ref 78.0–100.0)
Monocytes Absolute: 0.5 10*3/uL (ref 0.1–1.0)
Monocytes Relative: 6 %
NEUTROS ABS: 6.4 10*3/uL (ref 1.7–7.7)
Neutrophils Relative %: 78 %
Platelets: 181 10*3/uL (ref 150–400)
RBC: 4.09 MIL/uL (ref 3.87–5.11)
RDW: 14.9 % (ref 11.5–15.5)
WBC: 8.2 10*3/uL (ref 4.0–10.5)

## 2015-05-07 LAB — BASIC METABOLIC PANEL
ANION GAP: 6 (ref 5–15)
BUN: 8 mg/dL (ref 6–20)
CHLORIDE: 105 mmol/L (ref 101–111)
CO2: 22 mmol/L (ref 22–32)
Calcium: 8.2 mg/dL — ABNORMAL LOW (ref 8.9–10.3)
Creatinine, Ser: 0.47 mg/dL (ref 0.44–1.00)
GFR calc non Af Amer: >60 mL/min (ref >60)
Glucose, Bld: 92 mg/dL (ref 65–99)
Potassium: 3.5 mmol/L (ref 3.5–5.1)
Sodium: 133 mmol/L — ABNORMAL LOW (ref 135–145)

## 2015-05-07 LAB — TROPONIN I: Troponin I: <0.03 ng/mL (ref <0.031)

## 2015-05-07 NOTE — ED Notes (Signed)
Patient states she has a one week history of left chest pain.  Describes pain as a tightness around her heart which is associated with sob and lightheadedness.  Patient is [redacted] weeks pregnant and was told to come here for further evaluation today.

## 2015-05-07 NOTE — ED Notes (Signed)
Pt placed back on auto vitals Q30.

## 2015-05-07 NOTE — Discharge Instructions (Signed)

## 2015-05-07 NOTE — ED Notes (Signed)
Pt brought to room,  EKG performed.  Pt placed on cardiac monitor, pulse oximetry, and BP monitor.    Pt states she is not having any abdominal contractions, no vaginal discharge.  Pt states she called her OB/GYN and was told to come to ED to be checked out.  No respiratory distress noted.  VSS.  Pt states her chest pain has been intermittent for over one week.  Denies N/V related.  Some sob and dizziness associated with pain.  Today pain has been more constant.

## 2015-05-07 NOTE — ED Notes (Signed)
Patient placed on cardiac monitor. Pt placed on auto vitals Q30.  

## 2015-05-07 NOTE — ED Provider Notes (Signed)
CSN: [id number]     Arrival date & time 05/07/15  1616 History   First MD Initiated Contact with Patient 05/07/15 1626     Chief Complaint  Patient presents with  . Chest Pain     (Consider location/radiation/quality/duration/timing/severity/associated sxs/prior Treatment) HPI Comments: Patient with PMH of angina and HTN presents to the ED with a chief complaint of CP.  She is [redacted] weeks pregnant.  Next OB appointment is on Monday. She states that the symptoms started about a week ago.  She states that the pain is on the left side of her chest and feels like a band tightening.  She reports associated cough and SOB.  She states that pain is worsened with palpation and when she coughs.  She denies any fevers or chills.  She states that she was diagnosed with angina in the past, but was unable to follow-up with a cardiologist because of lack of insurance.  She has not taken anything for her symptoms.  She denies any abdominal pain, discharge, or bleeding.  The history is provided by the patient. No language interpreter was used.    Past Medical History  Diagnosis Date  . Hyperemesis gravidarum   . Vaginal Pap smear, abnormal     repeat was normal  . Ectopic pregnancy     treated with methotrexate  . Hypertension     prior to preg, no meds  . Infection     UTI  . Chest pain     2014   Past Surgical History  Procedure Laterality Date  . Cholecystectomy    . Cesarean section    . Knee surgery    . Tonsillectomy and adenoidectomy     Family History  Problem Relation Age of Onset  . Hypertension Mother   . Depression Mother   . Drug abuse Father   . Heart disease Father   . Hyperlipidemia Father   . Hypertension Father   . Stroke Father   . Cancer Maternal Grandmother   . Diabetes Maternal Grandmother   . Stroke Maternal Grandmother   . Cancer Maternal Grandfather   . Stroke Maternal Grandfather   . Alcohol abuse Paternal Grandmother   . Cancer Paternal Grandmother   .  Stroke Paternal Grandmother   . Alcohol abuse Paternal Grandfather   . Cancer Paternal Grandfather   . Stroke Paternal Grandfather    Social History  Substance Use Topics  . Smoking status: Former Research scientist (life sciences)  . Smokeless tobacco: Never Used     Comment: quit  2006  . Alcohol Use: 0.0 oz/week    0 Standard drinks or equivalent per week     Comment: socially   OB History    Gravida Para Term Preterm AB TAB SAB Ectopic Multiple Living   5 2 2  2  1 1  2       Obstetric Comments   MTX     Review of Systems  All other systems reviewed and are negative.     Allergies  Aspirin; Cephalosporins; and Contrast media   Home Medications   Prior to Admission medications   Medication Sig Start Date End Date Taking? Authorizing Provider  acetaminophen (TYLENOL) 325 MG tablet Take 325 mg by mouth every 6 (six) hours as needed for headache.   Yes Historical Provider, MD  cetirizine (ZYRTEC) 10 MG tablet Take 10 mg by mouth daily as needed for allergies.   Yes Historical Provider, MD  ondansetron (ZOFRAN) 4 MG tablet TAKE ONE TABLET BY  MOUTH ONCE DAILY AS NEEDED FOR NAUSEA OR VOMITING 04/21/15  Yes Seabron Spates, CNM  Prenat-FeAsp-Meth-FA-DHA w/o A (PRENATE PIXIE) 10-0.6-0.4-200 MG CAPS Take 1 tablet by mouth daily. 02/03/15  Yes Seabron Spates, CNM  Elastic Bandages & Supports (T.E.D. THIGH LENGTH/L-REGULAR) MISC 1 Device by Does not apply route daily. 03/03/15   Kathie Dike Leftwich-Kirby, CNM  metoCLOPramide (REGLAN) 10 MG tablet TAKE ONE TABLET BY MOUTH THREE TIMES DAILY WITH MEALS 04/21/15   Seabron Spates, CNM  promethazine (PHENERGAN) 25 MG suppository Place 1 suppository (25 mg total) rectally every 6 (six) hours as needed for nausea. May be used vaginally 12/24/14 12/24/15  Manya Silvas, CNM  promethazine (PHENERGAN) 25 MG tablet Take 0.5-1 tablets (12.5-25 mg total) by mouth every 6 (six) hours as needed. 12/18/14   Lisa A Leftwich-Kirby, CNM  sulfamethoxazole-trimethoprim (BACTRIM  DS,SEPTRA DS) 800-160 MG tablet Take 1 tablet by mouth 2 (two) times daily. 04/21/15   Thersa Salt Smith, CNM   BP 120/74 mmHg  Pulse 83  Temp(Src) 98.3 F (36.8 C) (Oral)  Resp 18  Ht 5\' 6"  (1.676 m)  Wt 114.306 kg  BMI 40.69 kg/m2  SpO2 98%  LMP 11/05/2014 (Exact Date) Physical Exam  Constitutional: She is oriented to person, place, and time. She appears well-developed and well-nourished.  HENT:  Head: Normocephalic and atraumatic.  Eyes: Conjunctivae and EOM are normal. Pupils are equal, round, and reactive to light.  Neck: Normal range of motion. Neck supple.  Cardiovascular: Normal rate and regular rhythm.  Exam reveals no gallop and no friction rub.   No murmur heard. Pulmonary/Chest: Effort normal and breath sounds normal. No respiratory distress. She has no wheezes. She has no rales. She exhibits no tenderness.  Abdominal: Soft. Bowel sounds are normal. She exhibits no distension and no mass. There is no tenderness. There is no rebound and no guarding.  Musculoskeletal: Normal range of motion. She exhibits no edema or tenderness.  Neurological: She is alert and oriented to person, place, and time.  Skin: Skin is warm and dry.  Psychiatric: She has a normal mood and affect. Her behavior is normal. Judgment and thought content normal.  Nursing note and vitals reviewed.   ED Course  Procedures (including critical care time) Results for orders placed or performed during the hospital encounter of 05/07/15  Troponin I  Result Value Ref Range   Troponin I <0.03 <0.031 ng/mL  CBC with Differential/Platelet  Result Value Ref Range   WBC 8.2 4.0 - 10.5 K/uL   RBC 4.09 3.87 - 5.11 MIL/uL   Hemoglobin 11.6 (L) 12.0 - 15.0 g/dL   HCT 35.2 (L) 36.0 - 46.0 %   MCV 86.1 78.0 - 100.0 fL   MCH 28.4 26.0 - 34.0 pg   MCHC 33.0 30.0 - 36.0 g/dL   RDW 14.9 11.5 - 15.5 %   Platelets 181 150 - 400 K/uL   Neutrophils Relative % 78 %   Neutro Abs 6.4 1.7 - 7.7 K/uL   Lymphocytes Relative 15  %   Lymphs Abs 1.2 0.7 - 4.0 K/uL   Monocytes Relative 6 %   Monocytes Absolute 0.5 0.1 - 1.0 K/uL   Eosinophils Relative 1 %   Eosinophils Absolute 0.1 0.0 - 0.7 K/uL   Basophils Relative 0 %   Basophils Absolute 0.0 0.0 - 0.1 K/uL  Basic metabolic panel  Result Value Ref Range   Sodium 133 (L) 135 - 145 mmol/L   Potassium 3.5 3.5 - 5.1 mmol/L  Chloride 105 101 - 111 mmol/L   CO2 22 22 - 32 mmol/L   Glucose, Bld 92 65 - 99 mg/dL   BUN 8 6 - 20 mg/dL   Creatinine, Ser 0.47 0.44 - 1.00 mg/dL   Calcium 8.2 (L) 8.9 - 10.3 mg/dL   GFR calc non Af Amer >60 >60 mL/min   GFR calc Af Amer >60 >60 mL/min   Anion gap 6 5 - 15   Dg Chest 2 View  05/07/2015  CLINICAL DATA:  Cough and chest pain for 5 days, hyperemesis gravidarum EXAM: CHEST  2 VIEW COMPARISON:  None. FINDINGS: The heart size and mediastinal contours are within normal limits. Both lungs are clear. The visualized skeletal structures are unremarkable. IMPRESSION: No active cardiopulmonary disease. Should be noted the patient was shielded for this examination Electronically Signed   By: Inez Catalina M.D.   On: 05/07/2015 17:20   [place] Mfm Ob Follow Up  04/17/2015  OBSTETRICAL ULTRASOUND: This exam was performed within a [facility]. The OB US report was generated in the [facility] system, and faxed to the ordering physician.  This report is available in the [facility]. See the [facility] Obstetric US report via the Image Link.   I have personally reviewed and evaluated these images and lab results as part of my medical decision-making.   EKG Interpretation   Date/Time:  Wednesday May 07 2015 16:35:49 EST Ventricular Rate:  79 PR Interval:  162 QRS Duration: 85 QT Interval:  364 QTC Calculation: 417 R Axis:   40 Text Interpretation:  Sinus rhythm Confirmed by ZACKOWSKI  MD, SCOTT  ([id number]) on 05/07/2015 4:44:20 PM      MDM   Final diagnoses:  Chest pain, unspecified chest pain type  Chest wall pain  Cough    Patient with chest pain x 1 week.  Symptoms are intermittent.  Has previous diagnosis of angina, but never followed up with cardiology.  She does have associated SOB, but states that she has been coughing for the past 2 weeks.  The pain is worsened with palpation and coughing, which makes me less suspicious for ACS and more likely MSK.  Will check labs and EKG.  Will reassess.  Patient has aspirin allergy.  Fetal HR 160.  Patient discussed with Dr. Rogene Houston, who agrees with assessment of musculoskeletal chest pain, likely secondary to muscle strain from coughing.  EKG shows normal sinus.  No ischemic changes.  Troponin is negative.  CXR is normal.      Montine Circle, PA-C 05/07/15 1828  Fredia Sorrow, MD 05/08/15 [phone]

## 2015-05-12 ENCOUNTER — Ambulatory Visit (INDEPENDENT_AMBULATORY_CARE_PROVIDER_SITE_OTHER): Payer: BLUE CROSS/BLUE SHIELD | Admitting: Advanced Practice Midwife

## 2015-05-12 ENCOUNTER — Encounter: Payer: Self-pay | Admitting: Advanced Practice Midwife

## 2015-05-12 VITALS — BP 135/69 | HR 88 | Wt 254.0 lb

## 2015-05-12 DIAGNOSIS — Z3482 Encounter for supervision of other normal pregnancy, second trimester: Secondary | ICD-10-CM

## 2015-05-12 DIAGNOSIS — Z36 Encounter for antenatal screening of mother: Secondary | ICD-10-CM | POA: Diagnosis not present

## 2015-05-12 DIAGNOSIS — Z3492 Encounter for supervision of normal pregnancy, unspecified, second trimester: Secondary | ICD-10-CM

## 2015-05-12 DIAGNOSIS — K219 Gastro-esophageal reflux disease without esophagitis: Secondary | ICD-10-CM

## 2015-05-12 MED ORDER — PANTOPRAZOLE SODIUM 20 MG PO TBEC: 20.0000 mg | DELAYED_RELEASE_TABLET | Freq: Every day | ORAL | Status: DC

## 2015-05-12 NOTE — Patient Instructions (Signed)

## 2015-05-12 NOTE — Progress Notes (Signed)
Subjective:  Ann Brock is a 30 y.o. [id number] at [redacted]w[redacted]d being seen today for ongoing prenatal care.  She is currently monitored for the following issues for this low-risk pregnancy and has Neck pain; Elevated blood pressure; History of ectopic pregnancy; Supervision of normal pregnancy; Chronic hypertension in pregnancy; History of cesarean delivery, currently pregnant; and Acid reflux on her problem list.  Patient reports heartburn.  Contractions: Irritability. Vag. Bleeding: Small.  Movement: Present. Denies leaking of fluid.   The following portions of the patient's history were reviewed and updated as appropriate: allergies, current medications, past family history, past medical history, past social history, past surgical history and problem list. Problem list updated.  Objective:   Filed Vitals:   05/12/15 1000  BP: 135/69  Pulse: 88  Weight: 254 lb (115.214 kg)    Fetal Status: Fetal Heart Rate (bpm): 137   Movement: Present     General:  Alert, oriented and cooperative. Patient is in no acute distress.  Skin: Skin is warm and dry. No rash noted.   Cardiovascular: Normal heart rate noted  Respiratory: Normal respiratory effort, no problems with respiration noted  Abdomen: Soft, gravid, appropriate for gestational age. Pain/Pressure: Present     Pelvic: Vag. Bleeding: Small Vag D/C Character: Thin   Cervical exam deferred        Extremities: Normal range of motion.  Edema: Trace  Mental Status: Normal mood and affect. Normal behavior. Normal judgment and thought content.   Urinalysis: Urine Protein: Trace Urine Glucose: Negative  Assessment and Plan:  Pregnancy: [id number] at [redacted]w[redacted]d  1. Normal pregnancy, second trimester    Glucola today - Glucose Tolerance, 1 HR (50g) - CBC - HIV antibody (with reflex) - RPR  2. Gastroesophageal reflux disease without esophagitis      Tried OTC meds with no success      Will Rx Protonix      Still using Zofran and Reglan.  Uses green  smoothies to combat constipation  Preterm labor symptoms and general obstetric precautions including but not limited to vaginal bleeding, contractions, leaking of fluid and fetal movement were reviewed in detail with the patient. Please refer to After Visit Summary for other counseling recommendations.  RTO 2 weeks  Seabron Spates, CNM

## 2015-05-13 ENCOUNTER — Telehealth: Payer: Self-pay | Admitting: *Deleted

## 2015-05-13 DIAGNOSIS — Z3492 Encounter for supervision of normal pregnancy, unspecified, second trimester: Secondary | ICD-10-CM

## 2015-05-13 LAB — CBC
HEMATOCRIT: 33.6 % — AB (ref 36.0–46.0)
Hemoglobin: 11.1 g/dL — ABNORMAL LOW (ref 12.0–15.0)
MCH: 28 pg (ref 26.0–34.0)
MCHC: 33 g/dL (ref 30.0–36.0)
MCV: 84.8 fL (ref 78.0–100.0)
MPV: 10.5 fL (ref 8.6–12.4)
PLATELETS: 176 10*3/uL (ref 150–400)
RBC: 3.96 MIL/uL (ref 3.87–5.11)
RDW: 15.4 % (ref 11.5–15.5)
WBC: 6.3 10*3/uL (ref 4.0–10.5)

## 2015-05-13 LAB — RPR

## 2015-05-13 LAB — GLUCOSE TOLERANCE, 1 HOUR (50G) W/O FASTING: Glucose, 1 Hr, gestational: 145 mg/dL — ABNORMAL HIGH (ref <140)

## 2015-05-13 LAB — HIV ANTIBODY (ROUTINE TESTING W REFLEX): HIV 1&2 Ab, 4th Generation: NONREACTIVE

## 2015-05-13 NOTE — Telephone Encounter (Signed)
Left message on cell phone of abnormal 1 hr GTT and needs to call office to schedule fasting 3 hr GTT

## 2015-05-15 ENCOUNTER — Other Ambulatory Visit: Payer: Self-pay | Admitting: *Deleted

## 2015-05-15 DIAGNOSIS — O219 Vomiting of pregnancy, unspecified: Secondary | ICD-10-CM

## 2015-05-15 MED ORDER — ONDANSETRON HCL 4 MG PO TABS: ORAL_TABLET | ORAL | Status: DC

## 2015-05-15 NOTE — Telephone Encounter (Signed)
Pt called stating that she was suppose to have RF on Zofran.  RF authorized sent to [facility].

## 2015-05-19 ENCOUNTER — Other Ambulatory Visit: Payer: BLUE CROSS/BLUE SHIELD

## 2015-05-19 DIAGNOSIS — R7309 Other abnormal glucose: Secondary | ICD-10-CM

## 2015-05-20 ENCOUNTER — Telehealth: Payer: Self-pay | Admitting: *Deleted

## 2015-05-20 LAB — GLUCOSE TOLERANCE, 3 HOURS
GLUCOSE, 1 HOUR-GESTATIONAL: 125 mg/dL (ref <190)
GLUCOSE, 2 HOUR-GESTATIONAL: 115 mg/dL (ref <165)
Glucose Tolerance, Fasting: 90 mg/dL (ref 65–104)
Glucose, GTT - 3 Hour: 86 mg/dL (ref <145)

## 2015-05-20 NOTE — Telephone Encounter (Signed)
LM on voicemail of normal; 3 hr GTT 

## 2015-05-26 ENCOUNTER — Encounter: Payer: Self-pay | Admitting: *Deleted

## 2015-05-26 ENCOUNTER — Ambulatory Visit (INDEPENDENT_AMBULATORY_CARE_PROVIDER_SITE_OTHER): Payer: BLUE CROSS/BLUE SHIELD | Admitting: Certified Nurse Midwife

## 2015-05-26 VITALS — BP 115/74 | HR 77 | Wt 253.0 lb

## 2015-05-26 DIAGNOSIS — Z3483 Encounter for supervision of other normal pregnancy, third trimester: Secondary | ICD-10-CM

## 2015-05-26 DIAGNOSIS — Z3493 Encounter for supervision of normal pregnancy, unspecified, third trimester: Secondary | ICD-10-CM

## 2015-05-26 NOTE — Progress Notes (Signed)
Subjective:  Ann Brock is a 30 y.o. [id number] at [redacted]w[redacted]d being seen today for ongoing prenatal care.  She is currently monitored for the following issues for this high-risk pregnancy and has Neck pain; Elevated blood pressure; History of ectopic pregnancy; Supervision of normal pregnancy; Chronic hypertension in pregnancy; History of cesarean delivery, currently pregnant; and Acid reflux on her problem list.  Patient reports no complaints.  Contractions: Irregular. Vag. Bleeding: None.  Movement: Present. Denies leaking of fluid.   The following portions of the patient's history were reviewed and updated as appropriate: allergies, current medications, past family history, past medical history, past social history, past surgical history and problem list. Problem list updated.  Objective:   Filed Vitals:   05/26/15 0959  BP: 115/74  Pulse: 77  Weight: 253 lb (114.76 kg)    Fetal Status: Fetal Heart Rate (bpm): 134 Fundal Height: 30 cm Movement: Present     General:  Alert, oriented and cooperative. Patient is in no acute distress.  Skin: Skin is warm and dry. No rash noted.   Cardiovascular: Normal heart rate noted  Respiratory: Normal respiratory effort, no problems with respiration noted  Abdomen: Soft, gravid, appropriate for gestational age. Pain/Pressure: Present     Pelvic: Vag. Bleeding: None Vag D/C Character: Thin   Cervical exam deferred        Extremities: Normal range of motion.  Edema: Trace  Mental Status: Normal mood and affect. Normal behavior. Normal judgment and thought content.   Urinalysis: Urine Protein: Trace Urine Glucose: Negative  Assessment and Plan:  Pregnancy: [id number] at [redacted]w[redacted]d  1. Supervision of normal pregnancy, third trimester   Preterm labor symptoms and general obstetric precautions including but not limited to vaginal bleeding, contractions, leaking of fluid and fetal movement were reviewed in detail with the patient. Please refer to After Visit  Summary for other counseling recommendations.  Return in about 2 weeks (around 06/09/2015).   Larey Days, CNM

## 2015-05-26 NOTE — Patient Instructions (Signed)

## 2015-06-03 ENCOUNTER — Encounter (HOSPITAL_COMMUNITY): Payer: Self-pay

## 2015-06-03 ENCOUNTER — Inpatient Hospital Stay (HOSPITAL_COMMUNITY)
Admission: AD | Admit: 2015-06-03 | Discharge: 2015-06-03 | Disposition: A | Discharge status: Home / Self Care | Payer: BLUE CROSS/BLUE SHIELD | Source: Ambulatory Visit | Attending: Obstetrics & Gynecology | Admitting: Obstetrics & Gynecology

## 2015-06-03 DIAGNOSIS — O34219 Maternal care for unspecified type scar from previous cesarean delivery: Secondary | ICD-10-CM

## 2015-06-03 DIAGNOSIS — Z91041 Radiographic dye allergy status: Secondary | ICD-10-CM | POA: Diagnosis not present

## 2015-06-03 DIAGNOSIS — O2343 Unspecified infection of urinary tract in pregnancy, third trimester: Secondary | ICD-10-CM | POA: Diagnosis not present

## 2015-06-03 DIAGNOSIS — Z3A30 30 weeks gestation of pregnancy: Secondary | ICD-10-CM | POA: Insufficient documentation

## 2015-06-03 DIAGNOSIS — N3000 Acute cystitis without hematuria: Secondary | ICD-10-CM

## 2015-06-03 DIAGNOSIS — Z881 Allergy status to other antibiotic agents status: Secondary | ICD-10-CM | POA: Insufficient documentation

## 2015-06-03 DIAGNOSIS — Z87891 Personal history of nicotine dependence: Secondary | ICD-10-CM | POA: Diagnosis not present

## 2015-06-03 DIAGNOSIS — Z823 Family history of stroke: Secondary | ICD-10-CM | POA: Insufficient documentation

## 2015-06-03 DIAGNOSIS — Z8249 Family history of ischemic heart disease and other diseases of the circulatory system: Secondary | ICD-10-CM | POA: Diagnosis not present

## 2015-06-03 DIAGNOSIS — Z886 Allergy status to analgesic agent status: Secondary | ICD-10-CM | POA: Insufficient documentation

## 2015-06-03 DIAGNOSIS — N898 Other specified noninflammatory disorders of vagina: Secondary | ICD-10-CM

## 2015-06-03 DIAGNOSIS — O26893 Other specified pregnancy related conditions, third trimester: Secondary | ICD-10-CM | POA: Diagnosis not present

## 2015-06-03 DIAGNOSIS — O10912 Unspecified pre-existing hypertension complicating pregnancy, second trimester: Secondary | ICD-10-CM

## 2015-06-03 DIAGNOSIS — Z3493 Encounter for supervision of normal pregnancy, unspecified, third trimester: Secondary | ICD-10-CM

## 2015-06-03 LAB — URINALYSIS, ROUTINE W REFLEX MICROSCOPIC
GLUCOSE, UA: 250 mg/dL — AB
HGB URINE DIPSTICK: NEGATIVE
Ketones, ur: 15 mg/dL — AB
LEUKOCYTES UA: NEGATIVE
Nitrite: POSITIVE — AB
PROTEIN: 100 mg/dL — AB
pH: 5 (ref 5.0–8.0)

## 2015-06-03 LAB — URINE MICROSCOPIC-ADD ON

## 2015-06-03 LAB — AMNISURE RUPTURE OF MEMBRANE (ROM) NOT AT ARMC: Amnisure ROM: NEGATIVE

## 2015-06-03 MED ORDER — NITROFURANTOIN MONOHYD MACRO 100 MG PO CAPS: 100.0000 mg | ORAL_CAPSULE | Freq: Two times a day (BID) | ORAL | Status: AC

## 2015-06-03 NOTE — Discharge Instructions (Signed)
Pregnancy and Urinary Tract Infection °A urinary tract infection (UTI) is a bacterial infection of the urinary tract. Infection of the urinary tract can include the ureters, kidneys (pyelonephritis), bladder (cystitis), and urethra (urethritis). All pregnant women should be screened for bacteria in the urinary tract. Identifying and treating a UTI will decrease the risk of preterm labor and developing more serious infections in both the mother and baby. °CAUSES °Bacteria germs cause almost all UTIs.  °RISK FACTORS °Many factors can increase your chances of getting a UTI during pregnancy. These include: °· Having a short urethra. °· Poor toilet and hygiene habits. °· Sexual intercourse. °· Blockage of urine along the urinary tract. °· Problems with the pelvic muscles or nerves. °· Diabetes. °· Obesity. °· Bladder problems after having several children. °· Previous history of UTI. °SIGNS AND SYMPTOMS  °· Pain, burning, or a stinging feeling when urinating. °· Suddenly feeling the need to urinate right away (urgency). °· Loss of bladder control (urinary incontinence). °· Frequent urination, more than is common with pregnancy. °· Lower abdominal or back discomfort. °· Cloudy urine. °· Blood in the urine (hematuria). °· Fever.  °When the kidneys are infected, the symptoms may be: °· Back pain. °· Flank pain on the right side more so than the left. °· Fever. °· Chills. °· Nausea. °· Vomiting. °DIAGNOSIS  °A urinary tract infection is usually diagnosed through urine tests. Additional tests and procedures are sometimes done. These may include: °· Ultrasound exam of the kidneys, ureters, bladder, and urethra. °· Looking in the bladder with a lighted tube (cystoscopy). °TREATMENT °Typically, UTIs can be treated with antibiotic medicines.  °HOME CARE INSTRUCTIONS  °· Only take over-the-counter or prescription medicines as directed by your health care provider. If you were prescribed antibiotics, take them as directed. Finish  them even if you start to feel better. °· Drink enough fluids to keep your urine clear or pale yellow. °· Do not have sexual intercourse until the infection is gone and your health care provider says it is okay. °· Make sure you are tested for UTIs throughout your pregnancy. These infections often come back.  °Preventing a UTI in the Future °· Practice good toilet habits. Always wipe from front to back. Use the tissue only once. °· Do not hold your urine. Empty your bladder as soon as possible when the urge comes. °· Do not douche or use deodorant sprays. °· Wash with soap and warm water around the genital area and the anus. °· Empty your bladder before and after sexual intercourse. °· Wear underwear with a cotton crotch. °· Avoid caffeine and carbonated drinks. They can irritate the bladder. °· Drink cranberry juice or take cranberry pills. This may decrease the risk of getting a UTI. °· Do not drink alcohol. °· Keep all your appointments and tests as scheduled.  °SEEK MEDICAL CARE IF:  °· Your symptoms get worse. °· You are still having fevers 2 or more days after treatment begins. °· You have a rash. °· You feel that you are having problems with medicines prescribed. °· You have abnormal vaginal discharge. °SEEK IMMEDIATE MEDICAL CARE IF:  °· You have back or flank pain. °· You have chills. °· You have blood in your urine. °· You have nausea and vomiting. °· You have contractions of your uterus. °· You have a gush of fluid from the vagina. °MAKE SURE YOU: °· Understand these instructions.   °· Will watch your condition.   °· Will get help right away if you are not doing   pain.  · You have chills.  · You have blood in your urine.  · You have nausea and vomiting.  · You have contractions of your uterus.  · You have a gush of fluid from the vagina.  MAKE SURE YOU:  · Understand these instructions.    · Will watch your condition.    · Will get help right away if you are not doing well or get worse.       This information is not intended to replace advice given to you by your health care provider. Make sure you discuss any questions you have with your health care provider.     Document Released: 06/26/2010 Document Revised: 12/20/2012 Document Reviewed: 09/28/2012  Elsevier Interactive Patient Education ©2016 [employer]

## 2015-06-03 NOTE — MAU Note (Signed)
Pt C/O ? Leaking for 2 days, felt trickle today at work.  Continues to have wet underwear today.  Having contractions for the last 2-3 hours.  Denies bleeding.

## 2015-06-03 NOTE — MAU Provider Note (Signed)
History     CSN: [id number]  Arrival date and time: 06/03/15 1340   First Provider Initiated Contact with Patient 06/03/15 1459      Chief Complaint  Patient presents with  . Rupture of Membranes   HPI Ann Brock 30 y.o. [id number] [redacted]w[redacted]d who presents to [facility] for leakage of fluid. Pt felt a gush of fluid that soaked her underwear at 1030 today, and has continued as a trickle until she presented to clinic. Pt reports that the fluid is clear and does not smell like urine, denies any blood or colored discharge. Pt has been feeling some contractions today that she reports feel different from the Braxton-Hicks contractions she normally experiences. Contractions are about 15 minutes apart and radiate to her back. Reports positive fetal movement. Pregnancy was complicated by subchorionic hematoma that has regressed in size. Pt denies other complications. Her last prenatal visit was last Monday, and her next scheduled visit is this following Monday.    Past Medical History  Diagnosis Date  . Hyperemesis gravidarum   . Vaginal Pap smear, abnormal     repeat was normal  . Ectopic pregnancy     treated with methotrexate  . Hypertension     prior to preg, no meds  . Infection     UTI  . Chest pain     2014    Past Surgical History  Procedure Laterality Date  . Cholecystectomy    . Cesarean section    . Knee surgery    . Tonsillectomy and adenoidectomy      Family History  Problem Relation Age of Onset  . Hypertension Mother   . Depression Mother   . Drug abuse Father   . Heart disease Father   . Hyperlipidemia Father   . Hypertension Father   . Stroke Father   . Cancer Maternal Grandmother   . Diabetes Maternal Grandmother   . Stroke Maternal Grandmother   . Cancer Maternal Grandfather   . Stroke Maternal Grandfather   . Alcohol abuse Paternal Grandmother   . Cancer Paternal Grandmother   . Stroke Paternal Grandmother   . Alcohol abuse Paternal  Grandfather   . Cancer Paternal Grandfather   . Stroke Paternal Grandfather     Social History  Substance Use Topics  . Smoking status: Former Research scientist (life sciences)  . Smokeless tobacco: Never Used     Comment: quit  2006  . Alcohol Use: 0.0 oz/week    0 Standard drinks or equivalent per week     Comment: socially prior to pregnancy    Allergies:  Allergies  Allergen Reactions  . Aspirin Anaphylaxis  . Cephalosporins Anaphylaxis  . Contrast Media  [Iodinated Diagnostic Agents] Anaphylaxis    Prescriptions prior to admission  Medication Sig Dispense Refill Last Dose  . metoCLOPramide (REGLAN) 10 MG tablet TAKE ONE TABLET BY MOUTH THREE TIMES DAILY WITH MEALS 90 tablet 0 06/03/2015 at Unknown time  . ondansetron (ZOFRAN) 4 MG tablet TAKE ONE TABLET BY MOUTH ONCE DAILY AS NEEDED FOR NAUSEA OR VOMITING 30 tablet 0 06/03/2015 at Unknown time  . pantoprazole (PROTONIX) 20 MG tablet Take 1 tablet (20 mg total) by mouth daily. 30 tablet 1 06/03/2015 at Unknown time  . acetaminophen (TYLENOL) 325 MG tablet Take 325 mg by mouth every 6 (six) hours as needed for headache.   prn  . Elastic Bandages & Supports (T.E.D. THIGH LENGTH/L-REGULAR) MISC 1 Device by Does not apply route daily. (Patient not taking: Reported on 06/03/2015) 2  each 0 Taking  . Prenat-FeAsp-Meth-FA-DHA w/o A (PRENATE PIXIE) 10-0.6-0.4-200 MG CAPS Take 1 tablet by mouth daily. (Patient not taking: Reported on 06/03/2015) 30 capsule 12 Taking  . promethazine (PHENERGAN) 25 MG suppository Place 1 suppository (25 mg total) rectally every 6 (six) hours as needed for nausea. May be used vaginally (Patient not taking: Reported on 06/03/2015) 12 suppository 1 Taking  . promethazine (PHENERGAN) 25 MG tablet Take 0.5-1 tablets (12.5-25 mg total) by mouth every 6 (six) hours as needed. (Patient not taking: Reported on 06/03/2015) 30 tablet 2 Taking  . sulfamethoxazole-trimethoprim (BACTRIM DS,SEPTRA DS) 800-160 MG tablet Take 1 tablet by mouth 2 (two) times  daily. (Patient not taking: Reported on 06/03/2015) 14 tablet 1 Taking    Review of Systems  Constitutional: Negative for fever and chills.  Cardiovascular: Positive for leg swelling (Pt reports leg swelling on occasion).  Genitourinary:       Leaking fluid   All other systems reviewed and are negative.    Physical Exam   Blood pressure 127/72, pulse 114, temperature 97.6 F (36.4 C), temperature source Oral, resp. rate 18, last menstrual period 11/05/2014.  Physical Exam  Nursing note and vitals reviewed. Constitutional: She is oriented to person, place, and time. She appears well-developed and well-nourished. No distress.  HENT:  Head: Normocephalic and atraumatic.  Neck: Normal range of motion. Neck supple.  Cardiovascular: Normal rate, regular rhythm and normal heart sounds.   Respiratory: Effort normal and breath sounds normal. No respiratory distress. She has no wheezes. She has no rales.  GI: Soft. She exhibits distension (Gravid abdomen, obese abdomen). There is no tenderness.  Genitourinary: Vaginal discharge (Speculum exam revealed white discharge) found.  Spec exam reveals white thick discharge; no pooling  Musculoskeletal: Normal range of motion. She exhibits no edema.  Neurological: She is alert and oriented to person, place, and time.  Skin: Skin is warm and dry.  Psychiatric: She has a normal mood and affect. Her behavior is normal. Judgment and thought content normal.   Dilation: Fingertip Effacement (%): Thick Exam by:: L Kerby Hockley cnm Results for orders placed or performed during the hospital encounter of 06/03/15 (from the past 24 hour(s))  Urinalysis, Routine w reflex microscopic (not at [facility])     Status: Abnormal   Collection Time: 06/03/15  1:52 PM  Result Value Ref Range   Color, Urine YELLOW YELLOW   APPearance CLOUDY (A) CLEAR   Specific Gravity, Urine >1.030 (H) 1.005 - 1.030   pH 5.0 5.0 - 8.0   Glucose, UA 250 (A) NEGATIVE mg/dL   Hgb urine  dipstick NEGATIVE NEGATIVE   Bilirubin Urine MODERATE (A) NEGATIVE   Ketones, ur 15 (A) NEGATIVE mg/dL   Protein, ur 100 (A) NEGATIVE mg/dL   Nitrite POSITIVE (A) NEGATIVE   Leukocytes, UA NEGATIVE NEGATIVE  Urine microscopic-add on     Status: Abnormal   Collection Time: 06/03/15  1:52 PM  Result Value Ref Range   Squamous Epithelial / LPF 6-30 (A) NONE SEEN   WBC, UA 0-5 0 - 5 WBC/hpf   RBC / HPF 0-5 0 - 5 RBC/hpf   Bacteria, UA MANY (A) NONE SEEN  Amnisure rupture of membrane (rom)not at [facility]     Status: None   Collection Time: 06/03/15  3:12 PM  Result Value Ref Range   Amnisure ROM NEGATIVE    MAU Course  Procedures  MDM Performed sterile speculum exam, which revealed no pooling and moderate white discharge. Amnisure negative. Cervical exam  revealed a closed cervix. UA results revealed a UTI. Will treat with Macrobid. Encourage Po fluids. FHR Reactive; Category 1, no contractions Assessment and Plan  Vaginal Discharge UTI Macrobid 100mg  po BID x 7 d Urine culture sent Encourage PO Fluids Keep next regular office appointment Discharge  Pratt Bress Grissett 06/03/2015, 3:19 PM

## 2015-06-04 LAB — CULTURE, OB URINE: Special Requests: NORMAL

## 2015-06-09 ENCOUNTER — Ambulatory Visit (INDEPENDENT_AMBULATORY_CARE_PROVIDER_SITE_OTHER): Payer: BLUE CROSS/BLUE SHIELD | Admitting: Advanced Practice Midwife

## 2015-06-09 ENCOUNTER — Encounter: Payer: Self-pay | Admitting: Advanced Practice Midwife

## 2015-06-09 VITALS — BP 123/71 | HR 73 | Wt 253.0 lb

## 2015-06-09 DIAGNOSIS — Z789 Other specified health status: Secondary | ICD-10-CM

## 2015-06-09 DIAGNOSIS — O468X9 Other antepartum hemorrhage, unspecified trimester: Secondary | ICD-10-CM

## 2015-06-09 DIAGNOSIS — O10913 Unspecified pre-existing hypertension complicating pregnancy, third trimester: Secondary | ICD-10-CM

## 2015-06-09 DIAGNOSIS — O368990 Maternal care for other specified fetal problems, unspecified trimester, not applicable or unspecified: Secondary | ICD-10-CM

## 2015-06-09 DIAGNOSIS — O418X90 Other specified disorders of amniotic fluid and membranes, unspecified trimester, not applicable or unspecified: Secondary | ICD-10-CM

## 2015-06-09 MED ORDER — BREAST PUMP MISC: Status: DC

## 2015-06-09 NOTE — Progress Notes (Signed)
Subjective:  Ann Brock is a 30 y.o. [id number] at [redacted]w[redacted]d being seen today for ongoing prenatal care.  She is currently monitored for the following issues for this high-risk pregnancy and has Neck pain; Elevated blood pressure; History of ectopic pregnancy; Supervision of normal pregnancy; Chronic hypertension in pregnancy; History of cesarean delivery, currently pregnant; and Acid reflux on her problem list.  Patient reports no complaints.  Contractions: Irregular. Vag. Bleeding: None.  Movement: Present. Denies leaking of fluid. Plans to breastfeed. Had difficulties w/ previous infants. Wants Rx breast pump.  The following portions of the patient's history were reviewed and updated as appropriate: allergies, current medications, past family history, past medical history, past social history, past surgical history and problem list. Problem list updated.  Objective:   Filed Vitals:   06/09/15 0951  BP: 123/71  Pulse: 73  Weight: 253 lb (114.76 kg)    Fetal Status: Fetal Heart Rate (bpm): 123   Movement: Present     General:  Alert, oriented and cooperative. Patient is in no acute distress.  Skin: Skin is warm and dry. No rash noted.   Cardiovascular: Normal heart rate noted  Respiratory: Normal respiratory effort, no problems with respiration noted  Abdomen: Soft, gravid, appropriate for gestational age. Pain/Pressure: Present     Pelvic: Vag. Bleeding: None Vag D/C Character: Thin   Cervical exam deferred        Extremities: Normal range of motion.  Edema: Trace  Mental Status: Normal mood and affect. Normal behavior. Normal judgment and thought content.   Urinalysis: Urine Protein: 1+ Urine Glucose: Negative  Assessment and Plan:  Pregnancy: [id number] at [redacted]w[redacted]d  1. Chronic hypertension in pregnancy, third trimester  - [place] MFM OB FOLLOW UP; Future  2. Subchorionic hematoma, antepartum, unspecified trimester, not applicable or unspecified fetus  - [place] MFM OB FOLLOW UP;  Future  3. Exclusively breastfeed infant  - Misc. Devices (BREAST PUMP) MISC; Dispense one breast pump for patient  Dispense: 1 each; Refill: 0  Preterm labor symptoms and general obstetric precautions including but not limited to vaginal bleeding, contractions, leaking of fluid and fetal movement were reviewed in detail with the patient. Please refer to After Visit Summary for other counseling recommendations.  Requested RCS and BTL. Return in about 1 week (around 06/16/2015) for Start twice weekly testing.   Manya Silvas, CNM

## 2015-06-09 NOTE — Patient Instructions (Addendum)
Breastfeeding Challenges and Solutions Even though breastfeeding is natural, it can be challenging, especially in the first few weeks after childbirth. It is normal for problems to arise when starting to breastfeed your new baby, even if you have breastfed before. This document provides some solutions to the most common breastfeeding challenges.  CHALLENGES AND SOLUTIONS Challenge--Cracked or Sore Nipples Cracked or sore nipples are commonly experienced by breastfeeding mothers. Cracked or sore nipples often are caused by inadequate latching (when your baby's mouth attaches to your breast to breastfeed). Soreness can also happen if your baby is not positioned properly at your breast. Although nipple cracking and soreness are common during the first week after birth, nipple pain is never normal. If you experience nipple cracking or soreness that lasts longer than 1 week or nipple pain, call your health care provider or lactation consultant.  Solution Ensure proper latching and positioning of your baby by following the steps below:  Find a comfortable place to sit or lie down, with your neck and back well supported.  Place a pillow or rolled up blanket under your baby to bring him or her to the level of your breast (if you are seated).  Make sure that your baby's abdomen is facing your abdomen.  Gently massage your breast. With your fingertips, massage from your chest wall toward your nipple in a circular motion. This encourages milk flow. You may need to continue this action during the feeding if your milk flows slowly.  Support your breast with 4 fingers underneath and your thumb above your nipple. Make sure your fingers are well away from your nipple and your baby's mouth.  Stroke your baby's lips gently with your finger or nipple.  When your baby's mouth is open wide enough, quickly bring your baby to your breast, placing your entire nipple and as much of the colored area around your nipple  (areola) as possible into your baby's mouth.  More areola should be visible above your baby's upper lip than below the lower lip.  Your baby's tongue should be between his or her lower gum and your breast.  Ensure that your baby's mouth is correctly positioned around your nipple (latched). Your baby's lips should create a seal on your breast and be turned out (everted).  It is common for your baby to suck for about 2-3 minutes in order to start the flow of breast milk. Signs that your baby has successfully latched on to your nipple include:   Quietly tugging or quietly sucking without causing you pain.   Swallowing heard between every 3-4 sucks.   Muscle movement above and in front of his or her ears with sucking.  Signs that your baby has not successfully latched on to nipple include:   Sucking sounds or smacking sounds from your baby while nursing.   Nipple pain.  Ensure that your breasts stay moisturized and healthy by:  Avoiding the use of soap on your nipples.   Wearing a supportive bra. Avoid wearing underwire-style bras or tight bras.   Air drying your nipples for 3-4 minutes after each feeding.   Using only cotton bra pads to absorb breast milk leakage. Leaking of breast milk between feedings is normal. Be sure to change the pads if they become soaked with milk.  Using lanolin on your nipples after nursing. Lanolin helps to maintain your skin's normal moisture barrier. If you use pure lanolin you do not need to wash it off before feeding your baby again. Pure lanolin   is not toxic to your baby. You may also hand express a few drops of breast milk and gently massage that milk into your nipples, allowing it to air dry. Challenge--Breast Engorgement Breast engorgement is the overfilling of your breasts with breast milk. In the first few weeks after giving birth, you may experience breast engorgement. Breast engorgement can make your breasts throb and feel hard, tightly  stretched, warm, and tender. Engorgement peaks about the fifth day after you give birth. Having breast engorgement does not mean you have to stop breastfeeding your baby. Solution  Breastfeed when you feel the need to reduce the fullness of your breasts or when your baby shows signs of hunger. This is called "breastfeeding on demand."  Newborns (babies younger than 4 weeks) often breastfeed every 1-3 hours during the day. You may need to awaken your baby to feed if he or she is asleep at a feeding time.  Do not allow your baby to sleep longer than 5 hours during the night without a feeding.  Pump or hand express breast milk before breastfeeding to soften your breast, areola, and nipple.  Apply warm, moist heat (in the shower or with warm water-soaked hand towels) just before feeding or pumping, or massage your breast before or during breastfeeding. This increases circulation and helps your milk to flow.  Completely empty your breasts when breastfeeding or pumping. Afterward, wear a snug bra (nursing or regular) or tank top for 1-2 days to signal your body to slightly decrease milk production. Only wear snug bras or tank tops to treat engorgement. Tight bras typically should be avoided by breastfeeding mothers. Once engorgement is relieved, return to wearing regular, loose-fitting clothes.  Apply ice packs to your breasts to lessen the pain from engorgement and relieve swelling, unless the ice is uncomfortable for you.  Do not delay feedings. Try to relax when it is time to feed your baby. This helps to trigger your "let-down reflex," which releases milk from your breast.  Ensure your baby is latched on to your breast and positioned properly while breastfeeding.  Allow your baby to remain at your breast as long as he or she is latched on well and actively sucking. Your baby will let you know when he or she is done breastfeeding by pulling away from your breast or falling asleep.  Avoid  introducing bottles or pacifiers to your baby in the early weeks of breastfeeding. Wait to introduce these things until after resolving any breastfeeding challenges.  Try to pump your milk on the same schedule as when your baby would breastfeed if you are returning to work or away from home for an extended period.  Drink plenty of fluids to avoid dehydration, which can eventually put you at greater risk of breast engorgement. If you follow these suggestions, your engorgement should improve in 24-48 hours. If you are still experiencing difficulty, call your lactation consultant or health care provider.  Challenge--Plugged Milk Ducts Plugged milk ducts occur when the duct does not drain milk effectively and becomes swollen. Wearing a tight-fitting nursing bra or having difficulty with latching may cause plugged milk ducts. Not drinking enough water (8-10 c [1.9-2.4 L] per day) can contribute to plugged milk ducts. Once a duct has become plugged, hard lumps, soreness, and redness may develop in your breast.  Solution Do not delay feedings. Feed your baby frequently and try to empty your breasts of milk at each feeding. Try breastfeeding from the affected side first so there is a   better chance that the milk will drain completely from that breast. Apply warm, moist towels to your breasts for 5-10 minutes before feeding. Alternatively, a hot shower right before breastfeeding can provide the moist heat that can encourage milk flow. Gentle massage of the sore area before and during a feeding may also help. Avoid wearing tight clothing or bras that put pressure on your breasts. Wear bras that offer good support to your breasts, but avoid underwire bras. If you have a plugged milk duct and develop a fever, you need to see your health care provider.  Challenge--Mastitis Mastitis is inflammation of your breast. It usually is caused by a bacterial infection and can cause flu-like symptoms. You may develop redness in  your breast and a fever. Often when mastitis occurs, your breast becomes firm, warm, and very painful. The most common causes of mastitis are poor latching, ineffective sucking from your baby, consistent pressure on your breast (possibly from wearing a tight-fitting bra or shirt that restricts the milk flow), unusual stress or fatigue, or missed feedings.  Solution You will be given antibiotic medicine to treat the infection. It is still important to breastfeed frequently to empty your breasts. Continuing to breastfeed while you recover from mastitis will not harm your baby. Make sure your baby is positioned properly during every feeding. Apply moist heat to your breasts for a few minutes before feeding to help the milk flow and to help your breasts empty more easily. Challenge--Thrush Thrush is a yeast infection that can form on your nipples, in your breast, or in your baby's mouth. It causes itching, soreness, burning or stabbing pain, and sometimes a rash.  Solution You will be given a medicated ointment for your nipples, and your baby will be given a liquid medicine for his or her mouth. It is important that you and your baby are treated at the same time because thrush can be passed between you and your baby. Change disposable nursing pads often. Any bras, towels, or clothing that come in contact with infected areas of your body or your baby's body need to be washed in very hot water every day. Wash your hands and your baby's hands often. All pacifiers, bottle nipples, or toys your baby puts in his or her mouth should be boiled once a day for 20 minutes. After 1 week of treatment, discard pacifiers and bottle nipples and buy new ones. All breast pump parts that touch the milk need to be boiled for 20 minutes every day. Challenge--Low Milk Supply You may not be producing enough milk if your baby is not gaining the proper amount of weight. Breast milk production is based on a supply-and-demand system. Your  milk supply depends on how frequently and effectively your baby empties your breast. Solution The more you breastfeed and pump, the more breast milk you will produce. It is important that your baby empties at least one of your breasts at each feeding. If this is not happening, then use a breast pump or hand express any milk that remains. This will help to drain as much milk as possible at each feeding. It will also signal your body to produce more milk. If your baby is not emptying your breasts, it may be due to latching, sucking, or positioning problems. If low milk supply continues after addressing these issues, contact your health care provider or a lactation specialist as soon as possible. Challenge--Inverted or Flat Nipples Some women have nipples that turn inward instead of protruding outward.   Other women have nipples that are flat. Inverted or flat nipples can sometimes make it more difficult for your baby to latch onto your breast. Solution You may be given a small device that pulls out inverted nipples. This device should be applied right before your baby is brought to your breast. You can also try using a breast pump for a short time before placing the baby at your breast. The pump can pull your nipple outwards to help your infant latch more easily. The baby's sucking motion will help the inverted nipple protrude as well.  If you have flat nipples, encourage your baby to latch onto your breast and feed frequently in the early days after birth. This will give your baby practice latching on correctly while your breast is still soft. When your milk supply increases, between the second and fifth day after birth and your breasts become full, your baby will have an easier time latching.  Contact a lactation consultant if you still have concerns. She or he can teach you additional techniques to address breastfeeding problems related to nipple shape and position.  FOR MORE INFORMATION [employer]: www.llli.org   This information is not intended to replace advice given to you by your health care provider. Make sure you discuss any questions you have with your health care provider.   Document Released: 08/23/2005 Document Revised: 03/22/2014 Document Reviewed: 08/25/2012 Elsevier Interactive Patient Education [employer].

## 2015-06-10 ENCOUNTER — Encounter (HOSPITAL_COMMUNITY): Payer: Self-pay | Admitting: *Deleted

## 2015-06-10 ENCOUNTER — Other Ambulatory Visit: Payer: Self-pay | Admitting: Advanced Practice Midwife

## 2015-06-12 ENCOUNTER — Other Ambulatory Visit: Payer: Self-pay | Admitting: Advanced Practice Midwife

## 2015-06-12 ENCOUNTER — Other Ambulatory Visit: Payer: BLUE CROSS/BLUE SHIELD

## 2015-06-12 ENCOUNTER — Ambulatory Visit (HOSPITAL_COMMUNITY)
Admission: RE | Admit: 2015-06-12 | Discharge: 2015-06-12 | Disposition: A | Discharge status: Home / Self Care | Payer: BLUE CROSS/BLUE SHIELD | Source: Ambulatory Visit | Attending: Advanced Practice Midwife | Admitting: Advanced Practice Midwife

## 2015-06-12 DIAGNOSIS — O468X9 Other antepartum hemorrhage, unspecified trimester: Secondary | ICD-10-CM

## 2015-06-12 DIAGNOSIS — O99213 Obesity complicating pregnancy, third trimester: Secondary | ICD-10-CM | POA: Diagnosis present

## 2015-06-12 DIAGNOSIS — O418X90 Other specified disorders of amniotic fluid and membranes, unspecified trimester, not applicable or unspecified: Secondary | ICD-10-CM

## 2015-06-12 DIAGNOSIS — O10913 Unspecified pre-existing hypertension complicating pregnancy, third trimester: Secondary | ICD-10-CM

## 2015-06-12 DIAGNOSIS — Z3A31 31 weeks gestation of pregnancy: Secondary | ICD-10-CM

## 2015-06-12 DIAGNOSIS — O34219 Maternal care for unspecified type scar from previous cesarean delivery: Secondary | ICD-10-CM

## 2015-06-12 DIAGNOSIS — O10013 Pre-existing essential hypertension complicating pregnancy, third trimester: Secondary | ICD-10-CM | POA: Insufficient documentation

## 2015-06-13 ENCOUNTER — Other Ambulatory Visit (HOSPITAL_COMMUNITY): Payer: Self-pay | Admitting: *Deleted

## 2015-06-13 DIAGNOSIS — O418X30 Other specified disorders of amniotic fluid and membranes, third trimester, not applicable or unspecified: Secondary | ICD-10-CM

## 2015-06-13 DIAGNOSIS — O468X3 Other antepartum hemorrhage, third trimester: Principal | ICD-10-CM

## 2015-06-16 ENCOUNTER — Ambulatory Visit (INDEPENDENT_AMBULATORY_CARE_PROVIDER_SITE_OTHER): Payer: BLUE CROSS/BLUE SHIELD | Admitting: *Deleted

## 2015-06-16 VITALS — BP 116/66 | HR 75 | Wt 254.0 lb

## 2015-06-16 DIAGNOSIS — O24419 Gestational diabetes mellitus in pregnancy, unspecified control: Secondary | ICD-10-CM | POA: Diagnosis not present

## 2015-06-18 ENCOUNTER — Inpatient Hospital Stay (HOSPITAL_COMMUNITY)
Admission: AD | Admit: 2015-06-18 | Discharge: 2015-06-18 | Disposition: A | Discharge status: Home / Self Care | Payer: BLUE CROSS/BLUE SHIELD | Source: Ambulatory Visit | Attending: Obstetrics and Gynecology | Admitting: Obstetrics and Gynecology

## 2015-06-18 ENCOUNTER — Encounter (HOSPITAL_COMMUNITY): Payer: Self-pay | Admitting: *Deleted

## 2015-06-18 DIAGNOSIS — Z3A32 32 weeks gestation of pregnancy: Secondary | ICD-10-CM | POA: Insufficient documentation

## 2015-06-18 DIAGNOSIS — N898 Other specified noninflammatory disorders of vagina: Secondary | ICD-10-CM | POA: Diagnosis not present

## 2015-06-18 DIAGNOSIS — O4703 False labor before 37 completed weeks of gestation, third trimester: Secondary | ICD-10-CM | POA: Diagnosis not present

## 2015-06-18 DIAGNOSIS — O26893 Other specified pregnancy related conditions, third trimester: Secondary | ICD-10-CM | POA: Diagnosis present

## 2015-06-18 DIAGNOSIS — O4700 False labor before 37 completed weeks of gestation, unspecified trimester: Secondary | ICD-10-CM | POA: Diagnosis not present

## 2015-06-18 LAB — URINALYSIS, ROUTINE W REFLEX MICROSCOPIC
BILIRUBIN URINE: NEGATIVE
GLUCOSE, UA: NEGATIVE mg/dL
Hgb urine dipstick: NEGATIVE
KETONES UR: NEGATIVE mg/dL
Leukocytes, UA: NEGATIVE
Nitrite: NEGATIVE
PH: 5 (ref 5.0–8.0)
Protein, ur: NEGATIVE mg/dL
Specific Gravity, Urine: >1.03 — ABNORMAL HIGH (ref 1.005–1.030)

## 2015-06-18 LAB — AMNISURE RUPTURE OF MEMBRANE (ROM) NOT AT ARMC: Amnisure ROM: NEGATIVE

## 2015-06-18 LAB — POCT FERN TEST: POCT FERN TEST: NEGATIVE

## 2015-06-18 NOTE — MAU Note (Signed)
Pt states that she was at work and having regular contractions all day since 10AM that seemed to slow down with rest.   Pt states started continously leaking thin,  clear, odorless fluid since 10AM. before going to work.  Pt states that around 1700 had a large collection of clear odorless fluid on chair that soaked through an absorbency pad but denies feeling a gush.  States has been feeling less baby movement all day.

## 2015-06-18 NOTE — MAU Note (Signed)
Urine in lab 

## 2015-06-18 NOTE — Discharge Instructions (Signed)
Preterm Birth °Preterm birth is a birth that happens before 37 weeks of pregnancy. Most pregnancies last about 39-41 weeks. Every week in the womb is important and is beneficial to the health of the infant. Infants born before 37 weeks of pregnancy are at a higher risk for complications. Depending on when the infant was born, he or she may be: °· Late preterm. Born between 32 weeks and 37 weeks of pregnancy. °· Very preterm. Born at less than 32 weeks of pregnancy. °· Extremely preterm. Born at less than 25 weeks of pregnancy. °The earlier a baby is born, the more likely the child will have issues related to prematurity. Complications and problems that can be seen in infants born too early include: °· Problems breathing (respiratory distress syndrome). °· Low birth weight. °· Problems feeding. °· Sleeping problems. °· Yellowing of the skin (jaundice). °· Infections such as pneumonia.  °Babies born very preterm or extremely preterm are at risk for more serious medical issues. These include: °· More severe breathing issues. °· Eyesight issues. °· Brain development issues (intraventricular hemorrhage). °· Behavioral and emotional development issues. °· Growth and developmental delays. °· Cerebral palsy. °· Serious feeding or bowel complications (necrotizing enterocolitis). °CAUSES  °There are two broad categories of preterm birth. °· Spontaneous preterm birth. This is a birth resulting from preterm labor (not medically induced) or preterm premature rupture of membranes (PPROM). °· Indicated preterm birth. This is a birth resulting from labor being medically induced due to health, personal, or social reasons. °RISK FACTORS °Preterm birth may be related to certain medical conditions, lifestyle factors, or demographic factors encountered by the mother or fetus. °· Medical conditions include: °¨ Multiple gestations (twins, triplets, and so on). °¨ Infection. °¨ Diabetes. °¨ Heart disease. °¨ Kidney disease. °¨ Cervical or  uterine abnormalities. °¨ Being underweight. °¨ High blood pressure or preeclampsia. °¨ Premature rupture of membranes (PROM). °¨ Birth defects in the fetus. °· Lifestyle factors include: °¨ Poor prenatal care. °¨ Poor nutrition or anemia. °¨ Cigarette smoking. °¨ Consuming alcohol. °¨ High levels of stress and lack of social or emotional support. °¨ Exposure to chemical or environmental toxins. °¨ Substance abuse. °· Demographic factors include: °¨ African-American ethnicity. °¨ Age (younger than 18 or older than 28 years of age). °¨ Low socioeconomic status. °Women with a history of preterm labor or who become pregnant within 18 months of giving birth are also at increased risk for preterm birth. °DIAGNOSIS  °Your health care provider may request additional tests to diagnose underlying complications resulting from preterm birth. Tests on the infant may include: °· Physical exam. °· Blood tests. °· Chest X-rays. °· Heart-lung monitoring. °TREATMENT  °After birth, special care will be taken to assess any problems or complications for the infant. Supportive care will be provided for the infant. Treatment depends on what problems are present and any complications that develop. Some preterm infants are cared for in a neonatal intensive care unit. In general, care may include: °· Maintaining temperature and oxygen in a clear heated box (baby isolette). °· Monitoring the infant's heart rate, breathing, and level of oxygen in the blood. °· Monitoring for signs of infection and, if needed, giving IV antibiotic medicine. °· Inserting a feeding tube (nose, mouth) or giving IV nutrition if unable to feed. °· Inserting a breathing tube (ventilation). °· Respiration support (continuous positive airway pressure [CPAP] or oxygen).  °Treatment will change as the infant builds up strength and is able to breathe and eat on his or her   own. For some infants, no special treatment is necessary. Parents may be educated on the potential  health risks of prematurity to the infant. HOME CARE INSTRUCTIONS  Understand your infant's special conditions and needs. It may be reassuring to learn about infant CPR.  Monitor your infant in the car seat until he or she grows and matures. Infant car seats can cause breathing difficulties for preterm infants.  Keep your infant warm. Dress your infant in layers and keep him or her away from drafts, especially in cold months of the year.  Wash your hands thoroughly after going to the bathroom or changing a diaper. Late preterm infants may be more prone to infection.  Follow all your health care provider's instructions for providing support and care to your preterm infant.  Get support from organizations and groups that understand your challenges.  Follow up with your infant's health care provider as directed. Prevention There are some things you can do to help lower your risk of having a preterm infant in the future. These include:  Good prenatal care throughout the entire pregnancy. See a health care provider regularly for advice and tests.  Management of underlying medical conditions.  Proper self-care and lifestyle changes.  Proper diet and weight control.  Watching for signs of various infections. SEEK MEDICAL CARE IF:  Your infant has feeding difficulties.  Your infant has sleeping difficulties.  Your infant has breathing difficulties.  Your infant's skin starts to look yellow.  Your infant shows signs of infection, such as a stuffy nose, fever, crying, or bluish color of the skin. FOR MORE INFORMATION [employer]: www.marchofdimes.com [employer]: www.prematurity.org   This information is not intended to replace advice given to you by your health care provider. Make sure you discuss any questions you have with your health care provider.   Document Released: 05/22/2003 Document Revised: 12/20/2012 Document Reviewed: 09/28/2012 Elsevier Interactive Patient  Education 2016 [employer]  

## 2015-06-18 NOTE — MAU Provider Note (Signed)
Chief Complaint:  Rupture of Membranes and Contractions   HPI: Ann Brock is a 30 y.o. [id number] at 69w1dwho presents to maternity admissions reporting leakage of fluid. The pt reports that she experienced a leakage of clear, odorless fluid today. She cleaned up and experienced more leakage. Pt denies gush of fluid. Pt reports this LOF is similar to prior LOF at previous MAU visit. She reports good fetal movement, vaginal bleeding, vaginal itching/burning, urinary symptoms, h/a, dizziness, n/v, or fever/chills.    HPI  Past Medical History: Past Medical History  Diagnosis Date  . Hyperemesis gravidarum   . Vaginal Pap smear, abnormal     repeat was normal  . Ectopic pregnancy     treated with methotrexate  . Hypertension     prior to preg, no meds  . Infection     UTI  . Chest pain     2014    Past obstetric history: OB History  Gravida Para Term Preterm AB SAB TAB Ectopic Multiple Living  5 2 2  2 1  1  2     # Outcome Date GA Lbr Len/2nd Weight Sex Delivery Anes PTL Lv  5 Current           4 Term 04/20/11 [redacted]w[redacted]d  7 lb 7 oz (3.374 kg) M CS-LTranv  N Y     Complications: Fetal Intolerance,Postpartum hemorrhage  3 Ectopic 2011 [redacted]w[redacted]d         2 Term 12/21/07 [redacted]w[redacted]d  7 lb 7 oz (3.374 kg) M Vag-Spont EPI N Y     Complications: Hyperemesis,Chorioamnionitis  1 SAB 2007 [redacted]w[redacted]d           Obstetric Comments  MTX    Past Surgical History: Past Surgical History  Procedure Laterality Date  . Cholecystectomy    . Cesarean section    . Knee surgery    . Tonsillectomy and adenoidectomy      Family History: Family History  Problem Relation Age of Onset  . Hypertension Mother   . Depression Mother   . Drug abuse Father   . Heart disease Father   . Hyperlipidemia Father   . Hypertension Father   . Stroke Father   . Cancer Maternal Grandmother   . Diabetes Maternal Grandmother   . Stroke Maternal Grandmother   . Cancer Maternal Grandfather   . Stroke Maternal Grandfather    . Alcohol abuse Paternal Grandmother   . Cancer Paternal Grandmother   . Stroke Paternal Grandmother   . Alcohol abuse Paternal Grandfather   . Cancer Paternal Grandfather   . Stroke Paternal Grandfather     Social History: Social History  Substance Use Topics  . Smoking status: Former Research scientist (life sciences)  . Smokeless tobacco: Never Used     Comment: quit  2006  . Alcohol Use: No     Comment: socially prior to pregnancy    Allergies:  Allergies  Allergen Reactions  . Aspirin Anaphylaxis  . Cephalosporins Anaphylaxis  . Contrast Media  [Iodinated Diagnostic Agents] Anaphylaxis    Meds:  Prescriptions prior to admission  Medication Sig Dispense Refill Last Dose  . acetaminophen (TYLENOL) 325 MG tablet Take 325 mg by mouth every 6 (six) hours as needed for headache.   Past Week at Unknown time  . metoCLOPramide (REGLAN) 10 MG tablet TAKE ONE TABLET BY MOUTH THREE TIMES DAILY WITH MEALS 90 tablet 0 06/18/2015 at Unknown time  . miconazole (MONISTAT 7) 2 % vaginal cream Place 1 Applicatorful vaginally at bedtime.  06/17/2015 at Unknown time  . ondansetron (ZOFRAN) 4 MG tablet TAKE ONE TABLET BY MOUTH ONCE DAILY AS NEEDED FOR NAUSEA OR VOMITING 30 tablet 0 06/18/2015 at Unknown time  . pantoprazole (PROTONIX) 20 MG tablet Take 1 tablet (20 mg total) by mouth daily. 30 tablet 1 06/18/2015 at Unknown time  . Elastic Bandages & Supports (T.E.D. THIGH LENGTH/L-REGULAR) MISC 1 Device by Does not apply route daily. 2 each 0 Taking  . Misc. Devices (BREAST PUMP) MISC Dispense one breast pump for patient 1 each 0 Taking  . Prenat-FeAsp-Meth-FA-DHA w/o A (PRENATE PIXIE) 10-0.6-0.4-200 MG CAPS Take 1 tablet by mouth daily. (Patient not taking: Reported on 06/18/2015) 30 capsule 12 Taking    ROS:  Review of Systems  Constitutional: Negative for fever.  Respiratory: Negative for cough and shortness of breath.   Cardiovascular: Negative for chest pain.  Gastrointestinal: Positive for nausea and vomiting  (Hyperemesis of pregnancy). Negative for diarrhea and constipation.  Genitourinary: Negative for dysuria and difficulty urinating.     I have reviewed patient's Past Medical Hx, Surgical Hx, Family Hx, Social Hx, medications and allergies.   Physical Exam   Patient Vitals for the past 24 hrs:  BP Temp Temp src Pulse Resp Height Weight  06/18/15 1832 118/68 mmHg 98.1 F (36.7 C) Oral 97 18 5\' 6"  (1.676 m) 253 lb (114.76 kg)   Constitutional: Well-developed, well-nourished female in no acute distress.  Cardiovascular: normal rate Respiratory: normal effort Neurologic: Alert and oriented x 4.   PELVIC EXAM: Amnisure collected by blind swab     FHT:  Baseline 130, moderate variability, accelerations present, no decelerations Contractions: none   Labs: Results for orders placed or performed during the hospital encounter of 06/18/15 (from the past 24 hour(s))  Fern Test     Status: Normal   Collection Time: 06/18/15  6:58 PM  Result Value Ref Range   POCT Fern Test Negative = intact amniotic membranes   Urinalysis, Routine w reflex microscopic (not at [facility])     Status: Abnormal   Collection Time: 06/18/15  7:02 PM  Result Value Ref Range   Color, Urine YELLOW YELLOW   APPearance CLEAR CLEAR   Specific Gravity, Urine >1.030 (H) 1.005 - 1.030   pH 5.0 5.0 - 8.0   Glucose, UA NEGATIVE NEGATIVE mg/dL   Hgb urine dipstick NEGATIVE NEGATIVE   Bilirubin Urine NEGATIVE NEGATIVE   Ketones, ur NEGATIVE NEGATIVE mg/dL   Protein, ur NEGATIVE NEGATIVE mg/dL   Nitrite NEGATIVE NEGATIVE   Leukocytes, UA NEGATIVE NEGATIVE  Amnisure rupture of membrane (rom)not at [facility]     Status: None   Collection Time: 06/18/15  7:22 PM  Result Value Ref Range   Amnisure ROM NEGATIVE    A/POS/-- (10/21 [id number])   MAU Course/MDM: I have ordered UA, fern test, and amnisure and reviewed results.  Pt stable at time of discharge.  Assessment: 1. Threatened preterm labor, third trimester   2. Vaginal  discharge during pregnancy in third trimester   Amnisure and fern test both negative suggest that pt is not ruptured. Discharge home with labor precautions and advise to continue routine prenatal visits.  Plan: Discharge home Labor precautions and fetal kick counts  Pt counseled to return to MAU if she experiences a gush of fluid or vaginal bleeding.     Follow-up Information    Follow up with [facility] [place].   Specialty:  Obstetrics and Gynecology   Why:  Return to MAU  as needed for emergencies   Contact information:   [address], [place] [phone]       Medication List    STOP taking these medications        PRENATE PIXIE 10-0.6-0.4-200 MG Caps      TAKE these medications        acetaminophen 325 MG tablet  Commonly known as:  TYLENOL  Take 325 mg by mouth every 6 (six) hours as needed for headache.     Breast Pump Misc  Dispense one breast pump for patient     metoCLOPramide 10 MG tablet  Commonly known as:  REGLAN  TAKE ONE TABLET BY MOUTH THREE TIMES DAILY WITH MEALS     miconazole 2 % vaginal cream  Commonly known as:  MONISTAT 7  Place 1 Applicatorful vaginally at bedtime.     ondansetron 4 MG tablet  Commonly known as:  ZOFRAN  TAKE ONE TABLET BY MOUTH ONCE DAILY AS NEEDED FOR NAUSEA OR VOMITING     pantoprazole 20 MG tablet  Commonly known as:  PROTONIX  Take 1 tablet (20 mg total) by mouth daily.     T.E.D. THIGH LENGTH/L-REGULAR Misc  1 Device by Does not apply route daily.        Fatima Blank Certified Nurse-Midwife 06/18/2015 7:54 PM

## 2015-06-19 ENCOUNTER — Ambulatory Visit (INDEPENDENT_AMBULATORY_CARE_PROVIDER_SITE_OTHER): Payer: BLUE CROSS/BLUE SHIELD | Admitting: *Deleted

## 2015-06-19 VITALS — BP 127/57 | HR 74 | Wt 253.0 lb

## 2015-06-19 DIAGNOSIS — O24419 Gestational diabetes mellitus in pregnancy, unspecified control: Secondary | ICD-10-CM

## 2015-06-23 ENCOUNTER — Ambulatory Visit (INDEPENDENT_AMBULATORY_CARE_PROVIDER_SITE_OTHER): Payer: BLUE CROSS/BLUE SHIELD | Admitting: Certified Nurse Midwife

## 2015-06-23 VITALS — BP 124/78 | HR 88 | Wt 252.0 lb

## 2015-06-23 DIAGNOSIS — Z3493 Encounter for supervision of normal pregnancy, unspecified, third trimester: Secondary | ICD-10-CM

## 2015-06-23 DIAGNOSIS — O10913 Unspecified pre-existing hypertension complicating pregnancy, third trimester: Secondary | ICD-10-CM | POA: Diagnosis not present

## 2015-06-26 ENCOUNTER — Ambulatory Visit (INDEPENDENT_AMBULATORY_CARE_PROVIDER_SITE_OTHER): Payer: BLUE CROSS/BLUE SHIELD | Admitting: *Deleted

## 2015-06-26 DIAGNOSIS — O10913 Unspecified pre-existing hypertension complicating pregnancy, third trimester: Secondary | ICD-10-CM

## 2015-06-30 ENCOUNTER — Ambulatory Visit (INDEPENDENT_AMBULATORY_CARE_PROVIDER_SITE_OTHER): Payer: BLUE CROSS/BLUE SHIELD | Admitting: *Deleted

## 2015-06-30 VITALS — BP 107/55 | HR 77 | Wt 253.0 lb

## 2015-06-30 DIAGNOSIS — O10913 Unspecified pre-existing hypertension complicating pregnancy, third trimester: Secondary | ICD-10-CM

## 2015-06-30 NOTE — Progress Notes (Signed)
NST reactive Several small 10 beat dips,reviewed by Dr Nehemiah Settle, [place] Seabron Spates, CNM

## 2015-07-03 ENCOUNTER — Ambulatory Visit (INDEPENDENT_AMBULATORY_CARE_PROVIDER_SITE_OTHER): Payer: BLUE CROSS/BLUE SHIELD | Admitting: *Deleted

## 2015-07-03 DIAGNOSIS — O10913 Unspecified pre-existing hypertension complicating pregnancy, third trimester: Secondary | ICD-10-CM | POA: Diagnosis not present

## 2015-07-07 ENCOUNTER — Encounter: Payer: Self-pay | Admitting: Advanced Practice Midwife

## 2015-07-07 ENCOUNTER — Ambulatory Visit (INDEPENDENT_AMBULATORY_CARE_PROVIDER_SITE_OTHER): Payer: BLUE CROSS/BLUE SHIELD | Admitting: Advanced Practice Midwife

## 2015-07-07 VITALS — BP 119/65 | HR 80 | Wt 254.0 lb

## 2015-07-07 DIAGNOSIS — O163 Unspecified maternal hypertension, third trimester: Secondary | ICD-10-CM | POA: Diagnosis not present

## 2015-07-07 DIAGNOSIS — O9989 Other specified diseases and conditions complicating pregnancy, childbirth and the puerperium: Secondary | ICD-10-CM

## 2015-07-07 DIAGNOSIS — O99891 Other specified diseases and conditions complicating pregnancy: Secondary | ICD-10-CM

## 2015-07-07 DIAGNOSIS — O09893 Supervision of other high risk pregnancies, third trimester: Secondary | ICD-10-CM

## 2015-07-07 DIAGNOSIS — R252 Cramp and spasm: Secondary | ICD-10-CM

## 2015-07-07 NOTE — Patient Instructions (Signed)
Leg Cramps Leg cramps occur when a muscle or muscles tighten and you have no control over this tightening (involuntary muscle contraction). Muscle cramps can develop in any muscle, but the most common place is in the calf muscles of the leg. Those cramps can occur during exercise or when you are at rest. Leg cramps are painful, and they may last for a few seconds to a few minutes. Cramps may return several times before they finally stop. Usually, leg cramps are not caused by a serious medical problem. In many cases, the cause is not known. Some common causes include:  Overexertion.  Overuse from repetitive motions, or doing the same thing over and over.  Remaining in a certain position for a long period of time.  Improper preparation, form, or technique while performing a sport or an activity.  Dehydration.  Injury.  Side effects of some medicines.  Abnormally low levels of the salts and ions in your blood (electrolytes), especially potassium and calcium. These levels could be low if you are taking water pills (diuretics) or if you are pregnant. HOME CARE INSTRUCTIONS Watch your condition for any changes. Taking the following actions may help to lessen any discomfort that you are feeling:  Stay well-hydrated. Drink enough fluid to keep your urine clear or pale yellow.  Try massaging, stretching, and relaxing the affected muscle. Do this for several minutes at a time.  For tight or tense muscles, use a warm towel, heating pad, or hot shower water directed to the affected area.  If you are sore or have pain after a cramp, applying ice to the affected area may relieve discomfort.  Put ice in a plastic bag.  Place a towel between your skin and the bag.  Leave the ice on for 20 minutes, 2-3 times per day.  Avoid strenuous exercise for several days if you have been having frequent leg cramps.  Make sure that your diet includes the essential minerals for your muscles to work  normally.  Take medicines only as directed by your health care provider. SEEK MEDICAL CARE IF:  Your leg cramps get more severe or more frequent, or they do not improve over time.  Your foot becomes cold, numb, or blue.   This information is not intended to replace advice given to you by your health care provider. Make sure you discuss any questions you have with your health care provider.   Document Released: 04/08/2004 Document Revised: 07/16/2014 Document Reviewed: 02/06/2014 Elsevier Interactive Patient Education 2016 Elsevier Inc.   

## 2015-07-07 NOTE — Progress Notes (Signed)
Subjective:  Ann Brock is a 30 y.o. [id number] at [redacted]w[redacted]d being seen today for ongoing prenatal care.  She is currently monitored for the following issues for this high-risk pregnancy and has Neck pain; Elevated blood pressure; History of ectopic pregnancy; Supervision of normal pregnancy; Chronic hypertension in pregnancy; History of cesarean delivery, currently pregnant; and Acid reflux on her problem list.  Patient reports leg cramps at night.   .  .   . Denies leaking of fluid.   The following portions of the patient's history were reviewed and updated as appropriate: allergies, current medications, past family history, past medical history, past social history, past surgical history and problem list. Problem list updated.  Objective:  There were no vitals filed for this visit.  Fetal Status:           General:  Alert, oriented and cooperative. Patient is in no acute distress.  Skin: Skin is warm and dry. No rash noted.   Cardiovascular: Normal heart rate noted  Respiratory: Normal respiratory effort, no problems with respiration noted  Abdomen: Soft, gravid, appropriate for gestational age.       Pelvic:       Cervical exam deferred        Extremities: Normal range of motion.     Mental Status: Normal mood and affect. Normal behavior. Normal judgment and thought content.   Urinalysis:      Assessment and Plan:  Pregnancy: [id number] at [redacted]w[redacted]d Patient Active Problem List   Diagnosis Date Noted  . Acid reflux 05/12/2015  . Supervision of normal pregnancy 01/03/2015  . Chronic hypertension in pregnancy 01/03/2015  . History of cesarean delivery, currently pregnant 01/03/2015  . History of ectopic pregnancy 12/04/2014  . Neck pain 11/14/2014  . Elevated blood pressure 11/14/2014   NST equivocal.  Multiple small variable decels noted, last only 2-25 seconds, but they are repetitive.  Will get AFI:   Consult Dr Nehemiah Settle:  He states this is just hyper-reactivity and is  reassuring.  Discussed minimal Mag supplements for leg cramps. Discussed stretching is far more effective, since she is on her feet all day, recommend frequent stretching . Preterm labor symptoms and general obstetric precautions including but not limited to vaginal bleeding, contractions, leaking of fluid and fetal movement were reviewed in detail with the patient. Please refer to After Visit Summary for other counseling recommendations.   Has [place] Thursday RTO 1 week  Seabron Spates, CNM

## 2015-07-10 ENCOUNTER — Encounter (HOSPITAL_COMMUNITY): Payer: Self-pay

## 2015-07-10 ENCOUNTER — Ambulatory Visit (HOSPITAL_COMMUNITY)
Admission: RE | Admit: 2015-07-10 | Discharge: 2015-07-10 | Disposition: A | Discharge status: Home / Self Care | Payer: BLUE CROSS/BLUE SHIELD | Source: Ambulatory Visit | Attending: Advanced Practice Midwife | Admitting: Advanced Practice Midwife

## 2015-07-10 ENCOUNTER — Other Ambulatory Visit (HOSPITAL_COMMUNITY): Payer: Self-pay | Admitting: Maternal and Fetal Medicine

## 2015-07-10 DIAGNOSIS — O99213 Obesity complicating pregnancy, third trimester: Secondary | ICD-10-CM

## 2015-07-10 DIAGNOSIS — O468X3 Other antepartum hemorrhage, third trimester: Secondary | ICD-10-CM

## 2015-07-10 DIAGNOSIS — O34219 Maternal care for unspecified type scar from previous cesarean delivery: Secondary | ICD-10-CM | POA: Insufficient documentation

## 2015-07-10 DIAGNOSIS — O10919 Unspecified pre-existing hypertension complicating pregnancy, unspecified trimester: Secondary | ICD-10-CM

## 2015-07-10 DIAGNOSIS — Z3A35 35 weeks gestation of pregnancy: Secondary | ICD-10-CM | POA: Insufficient documentation

## 2015-07-10 DIAGNOSIS — O418X30 Other specified disorders of amniotic fluid and membranes, third trimester, not applicable or unspecified: Secondary | ICD-10-CM

## 2015-07-10 DIAGNOSIS — O10013 Pre-existing essential hypertension complicating pregnancy, third trimester: Secondary | ICD-10-CM | POA: Insufficient documentation

## 2015-07-10 DIAGNOSIS — Z3A31 31 weeks gestation of pregnancy: Secondary | ICD-10-CM

## 2015-07-11 ENCOUNTER — Other Ambulatory Visit: Payer: Self-pay | Admitting: Advanced Practice Midwife

## 2015-07-14 ENCOUNTER — Ambulatory Visit (INDEPENDENT_AMBULATORY_CARE_PROVIDER_SITE_OTHER): Payer: BLUE CROSS/BLUE SHIELD | Admitting: *Deleted

## 2015-07-14 VITALS — BP 131/71 | HR 77 | Wt 254.0 lb

## 2015-07-14 DIAGNOSIS — O163 Unspecified maternal hypertension, third trimester: Secondary | ICD-10-CM | POA: Diagnosis not present

## 2015-07-14 DIAGNOSIS — K219 Gastro-esophageal reflux disease without esophagitis: Secondary | ICD-10-CM

## 2015-07-14 DIAGNOSIS — O34219 Maternal care for unspecified type scar from previous cesarean delivery: Secondary | ICD-10-CM

## 2015-07-14 DIAGNOSIS — Z3493 Encounter for supervision of normal pregnancy, unspecified, third trimester: Secondary | ICD-10-CM

## 2015-07-14 DIAGNOSIS — Z3483 Encounter for supervision of other normal pregnancy, third trimester: Secondary | ICD-10-CM

## 2015-07-14 MED ORDER — PANTOPRAZOLE SODIUM 20 MG PO TBEC: 20.0000 mg | DELAYED_RELEASE_TABLET | Freq: Every day | ORAL | Status: DC

## 2015-07-14 MED FILL — PANTOPRAZOLE SOD DR 20 MG T: 20 | 30 days supply | Qty: 30 | Fill #0

## 2015-07-14 NOTE — Progress Notes (Addendum)
Subjective:  Ann Brock is a 30 y.o. [id number] at [redacted]w[redacted]d being seen today for ongoing prenatal care.  She is currently monitored for the following issues for this high-risk pregnancy and has Neck pain; Elevated blood pressure; History of ectopic pregnancy; Supervision of normal pregnancy; Chronic hypertension in pregnancy; History of cesarean delivery, currently pregnant; and Acid reflux on her problem list.  Patient reports passing mucus plug and irregular contractions.  Contractions: Irregular. Vag. Bleeding: None.  Movement: Present. Denies leaking of fluid.   The following portions of the patient's history were reviewed and updated as appropriate: allergies, current medications, past family history, past medical history, past social history, past surgical history and problem list. Problem list updated.  Objective:   Filed Vitals:   07/14/15 0830  BP: 131/71  Pulse: 77  Weight: 254 lb (115.214 kg)    Fetal Status:     Movement: Present  Presentation: Vertex  NST - Reactive  General:  Alert, oriented and cooperative. Patient is in no acute distress.  Skin: Skin is warm and dry. No rash noted.   Cardiovascular: Normal heart rate noted  Respiratory: Normal respiratory effort, no problems with respiration noted  Abdomen: Soft, gravid, appropriate for gestational age. Pain/Pressure: Present     Pelvic: Vag. Bleeding: None Vag D/C Character: Watery   Cervical exam performed      external os 1cm; internal os closed  Extremities: Normal range of motion.  Edema: Trace  Mental Status: Normal mood and affect. Normal behavior. Normal judgment and thought content.   Urinalysis:      Assessment and Plan:  Pregnancy: [id number] at [redacted]w[redacted]d  1. Supervision of normal pregnancy, third trimester - Amniotic fluid index with NST; Future - NST reactive  2. History of cesarean delivery, currently pregnant - Amniotic fluid index with NST; Future - Csection scheduled for 08/05/15  3. Hypertension in  pregnancy, third trimester - Amniotic fluid index with NST; Future  Preterm labor symptoms and general obstetric precautions including but not limited to vaginal bleeding, contractions, leaking of fluid and fetal movement were reviewed in detail with the patient. Please refer to After Visit Summary for other counseling recommendations.   Return on Thursday for NST   Gwen Pounds, CNM

## 2015-07-14 NOTE — Addendum Note (Signed)
Addended by: Asencion Islam on: 07/14/2015 10:22 AM   Modules accepted: Orders

## 2015-07-17 ENCOUNTER — Ambulatory Visit (INDEPENDENT_AMBULATORY_CARE_PROVIDER_SITE_OTHER): Payer: BLUE CROSS/BLUE SHIELD | Admitting: *Deleted

## 2015-07-17 ENCOUNTER — Other Ambulatory Visit: Payer: BLUE CROSS/BLUE SHIELD | Admitting: *Deleted

## 2015-07-17 VITALS — BP 124/81 | HR 87 | Wt 255.0 lb

## 2015-07-17 DIAGNOSIS — O163 Unspecified maternal hypertension, third trimester: Secondary | ICD-10-CM

## 2015-07-17 DIAGNOSIS — Z789 Other specified health status: Secondary | ICD-10-CM

## 2015-07-17 MED ORDER — MISC. DEVICES MISC: Status: DC

## 2015-07-17 MED ORDER — BREAST PUMP MISC: Status: DC

## 2015-07-21 ENCOUNTER — Ambulatory Visit (INDEPENDENT_AMBULATORY_CARE_PROVIDER_SITE_OTHER): Payer: BLUE CROSS/BLUE SHIELD | Admitting: Advanced Practice Midwife

## 2015-07-21 ENCOUNTER — Other Ambulatory Visit: Payer: Self-pay | Admitting: Advanced Practice Midwife

## 2015-07-21 ENCOUNTER — Encounter: Payer: Self-pay | Admitting: Advanced Practice Midwife

## 2015-07-21 VITALS — BP 115/73 | HR 81 | Wt 252.0 lb

## 2015-07-21 DIAGNOSIS — O321XX0 Maternal care for breech presentation, not applicable or unspecified: Secondary | ICD-10-CM

## 2015-07-21 DIAGNOSIS — Z36 Encounter for antenatal screening of mother: Secondary | ICD-10-CM

## 2015-07-21 DIAGNOSIS — O163 Unspecified maternal hypertension, third trimester: Secondary | ICD-10-CM | POA: Diagnosis not present

## 2015-07-21 DIAGNOSIS — Z3483 Encounter for supervision of other normal pregnancy, third trimester: Secondary | ICD-10-CM

## 2015-07-21 NOTE — Patient Instructions (Signed)

## 2015-07-21 NOTE — Progress Notes (Signed)
Subjective:  Ann Brock is a 30 y.o. [phone] at [redacted]w[redacted]d being seen today for ongoing prenatal care.  She is currently monitored for the following issues for this high-risk pregnancy and has Neck pain; Elevated blood pressure; History of ectopic pregnancy; Supervision of normal pregnancy; Chronic hypertension in pregnancy; History of cesarean delivery, currently pregnant; Acid reflux; and Breech presentation, antepartum on her problem list.  Patient reports no complaints.  Contractions: Irregular. Vag. Bleeding: None.  Movement: Present. Denies leaking of fluid.   The following portions of the patient's history were reviewed and updated as appropriate: allergies, current medications, past family history, past medical history, past social history, past surgical history and problem list. Problem list updated.  Objective:   Filed Vitals:   07/21/15 1111  BP: 115/73  Pulse: 81  Weight: 252 lb (114.306 kg)    Fetal Status:     Movement: Present  Presentation: Complete Breech  General:  Alert, oriented and cooperative. Patient is in no acute distress.  Skin: Skin is warm and dry. No rash noted.   Cardiovascular: Normal heart rate noted  Respiratory: Normal respiratory effort, no problems with respiration noted  Abdomen: Soft, gravid, appropriate for gestational age. Pain/Pressure: Present     Pelvic: Vag. Bleeding: None Vag D/C Character: Thin   Cervical exam performed        Extremities: Normal range of motion.  Edema: Trace  Mental Status: Normal mood and affect. Normal behavior. Normal judgment and thought content.   Urinalysis:      Assessment and Plan:  Pregnancy: [id number] at [redacted]w[redacted]d  1. Hypertension in pregnancy, antepartum, third trimester     Normotensive today - Amniotic fluid index with NST; Future - GC/chlamydia probe amp, urine - Culture, beta strep (group b only)       NST reactive, occasional contractions  2. Breech presentation, antepartum, not applicable or  unspecified fetus      Discovered at [place] today      States felt painful movement this week one day      Has C/S planned  Term labor symptoms and general obstetric precautions including but not limited to vaginal bleeding, contractions, leaking of fluid and fetal movement were reviewed in detail with the patient. Please refer to After Visit Summary for other counseling recommendations.  RTO 1 week  Seabron Spates, CNM

## 2015-07-21 NOTE — Progress Notes (Signed)
NST/AFI and GBS today

## 2015-07-22 LAB — GC/CHLAMYDIA PROBE AMP
CT Probe RNA: NOT DETECTED
GC Probe RNA: NOT DETECTED

## 2015-07-23 LAB — CULTURE, BETA STREP (GROUP B ONLY)

## 2015-07-24 ENCOUNTER — Ambulatory Visit (INDEPENDENT_AMBULATORY_CARE_PROVIDER_SITE_OTHER): Payer: BLUE CROSS/BLUE SHIELD | Admitting: *Deleted

## 2015-07-24 VITALS — Wt 255.0 lb

## 2015-07-24 DIAGNOSIS — O163 Unspecified maternal hypertension, third trimester: Secondary | ICD-10-CM

## 2015-07-24 DIAGNOSIS — O133 Gestational [pregnancy-induced] hypertension without significant proteinuria, third trimester: Secondary | ICD-10-CM | POA: Diagnosis not present

## 2015-07-25 ENCOUNTER — Telehealth (HOSPITAL_COMMUNITY): Payer: Self-pay | Admitting: *Deleted

## 2015-07-25 NOTE — Telephone Encounter (Signed)
Preadmission screen  

## 2015-07-28 ENCOUNTER — Ambulatory Visit (INDEPENDENT_AMBULATORY_CARE_PROVIDER_SITE_OTHER): Payer: BLUE CROSS/BLUE SHIELD | Admitting: Advanced Practice Midwife

## 2015-07-28 ENCOUNTER — Encounter (HOSPITAL_COMMUNITY): Payer: Self-pay

## 2015-07-28 ENCOUNTER — Encounter: Payer: Self-pay | Admitting: Advanced Practice Midwife

## 2015-07-28 VITALS — BP 112/72 | HR 80 | Wt 255.0 lb

## 2015-07-28 DIAGNOSIS — O34219 Maternal care for unspecified type scar from previous cesarean delivery: Secondary | ICD-10-CM

## 2015-07-28 DIAGNOSIS — O10913 Unspecified pre-existing hypertension complicating pregnancy, third trimester: Secondary | ICD-10-CM | POA: Diagnosis not present

## 2015-07-28 DIAGNOSIS — O0993 Supervision of high risk pregnancy, unspecified, third trimester: Secondary | ICD-10-CM

## 2015-07-28 NOTE — Patient Instructions (Signed)
Braxton Hicks Contractions °Contractions of the uterus can occur throughout pregnancy. Contractions are not always a sign that you are in labor.  °WHAT ARE BRAXTON HICKS CONTRACTIONS?  °Contractions that occur before labor are called Braxton Hicks contractions, or false labor. Toward the end of pregnancy (32-34 weeks), these contractions can develop more often and may become more forceful. This is not true labor because these contractions do not result in opening (dilatation) and thinning of the cervix. They are sometimes difficult to tell apart from true labor because these contractions can be forceful and people have different pain tolerances. You should not feel embarrassed if you go to the hospital with false labor. Sometimes, the only way to tell if you are in true labor is for your health care provider to look for changes in the cervix. °If there are no prenatal problems or other health problems associated with the pregnancy, it is completely safe to be sent home with false labor and await the onset of true labor. °HOW CAN YOU TELL THE DIFFERENCE BETWEEN TRUE AND FALSE LABOR? °False Labor °· The contractions of false labor are usually shorter and not as hard as those of true labor.   °· The contractions are usually irregular.   °· The contractions are often felt in the front of the lower abdomen and in the groin.   °· The contractions may go away when you walk around or change positions while lying down.   °· The contractions get weaker and are shorter lasting as time goes on.   °· The contractions do not usually become progressively stronger, regular, and closer together as with true labor.   °True Labor °· Contractions in true labor last 30-70 seconds, become very regular, usually become more intense, and increase in frequency.   °· The contractions do not go away with walking.   °· The discomfort is usually felt in the top of the uterus and spreads to the lower abdomen and low back.   °· True labor can be  determined by your health care provider with an exam. This will show that the cervix is dilating and getting thinner.   °WHAT TO REMEMBER °· Keep up with your usual exercises and follow other instructions given by your health care provider.   °· Take medicines as directed by your health care provider.   °· Keep your regular prenatal appointments.   °· Eat and drink lightly if you think you are going into labor.   °· If Braxton Hicks contractions are making you uncomfortable:   °¨ Change your position from lying down or resting to walking, or from walking to resting.   °¨ Sit and rest in a tub of warm water.   °¨ Drink 2-3 glasses of water. Dehydration may cause these contractions.   °¨ Do slow and deep breathing several times an hour.   °WHEN SHOULD I SEEK IMMEDIATE MEDICAL CARE? °Seek immediate medical care if: °· Your contractions become stronger, more regular, and closer together.   °· You have fluid leaking or gushing from your vagina.   °· You have a fever.   °· You pass blood-tinged mucus.   °· You have vaginal bleeding.   °· You have continuous abdominal pain.   °· You have low back pain that you never had before.   °· You feel your baby's head pushing down and causing pelvic pressure.   °· Your baby is not moving as much as it used to.   °  °This information is not intended to replace advice given to you by your health care provider. Make sure you discuss any questions you have with your health care   provider. °  °Document Released: 03/01/2005 Document Revised: 03/06/2013 Document Reviewed: 12/11/2012 °Elsevier Interactive Patient Education ©2016 Elsevier Inc. ° °

## 2015-07-29 ENCOUNTER — Encounter (HOSPITAL_COMMUNITY): Payer: Self-pay

## 2015-07-29 ENCOUNTER — Inpatient Hospital Stay (HOSPITAL_COMMUNITY)
Admission: AD | Admit: 2015-07-29 | Discharge: 2015-07-29 | Disposition: A | Discharge status: Home / Self Care | Payer: BLUE CROSS/BLUE SHIELD | Source: Ambulatory Visit | Attending: Obstetrics & Gynecology | Admitting: Obstetrics & Gynecology

## 2015-07-29 DIAGNOSIS — O471 False labor at or after 37 completed weeks of gestation: Secondary | ICD-10-CM | POA: Diagnosis present

## 2015-07-29 DIAGNOSIS — O34219 Maternal care for unspecified type scar from previous cesarean delivery: Secondary | ICD-10-CM

## 2015-07-29 DIAGNOSIS — O0993 Supervision of high risk pregnancy, unspecified, third trimester: Secondary | ICD-10-CM

## 2015-07-29 DIAGNOSIS — O321XX0 Maternal care for breech presentation, not applicable or unspecified: Secondary | ICD-10-CM

## 2015-07-29 DIAGNOSIS — O10913 Unspecified pre-existing hypertension complicating pregnancy, third trimester: Secondary | ICD-10-CM

## 2015-07-29 NOTE — MAU Note (Signed)
Notified provider that patient is 3-50/-3 on cervical exam. Provider said patient could be sent home with labor precautions.

## 2015-07-29 NOTE — MAU Note (Signed)
Notified provider that patient is 2/50/-2. Previous c-section but desires a TOLAC. Provider said patient can ambulate then be rechecked in an hour if baby is reactive.

## 2015-07-29 NOTE — Progress Notes (Signed)
Subjective:  Ann Brock is a 30 y.o. [id number] at [redacted]w[redacted]d being seen today for ongoing prenatal care.  She is currently monitored for the following issues for this high-risk pregnancy and has Neck pain; Elevated blood pressure; History of ectopic pregnancy; Supervision of normal pregnancy; Chronic hypertension in pregnancy; History of cesarean delivery, currently pregnant; Acid reflux; and Breech presentation, antepartum on her problem list.  Patient reports occasional contractions, one epidode of possible LOF 2 days ago 5/13/7, feeling baby make some large mvmts.  Contractions: Irregular. Vag. Bleeding: None.  Movement: Present. ? leaking of fluid x 1.   The following portions of the patient's history were reviewed and updated as appropriate: allergies, current medications, past family history, past medical history, past social history, past surgical history and problem list. Problem list updated.  Objective:   Filed Vitals:   07/28/15 1009  BP: 112/72  Pulse: 80  Weight: 255 lb (115.667 kg)    Fetal Status: Fetal Heart Rate (bpm): NST reactive   Movement: Present  Presentation: Vertex  General:  Alert, oriented and cooperative. Patient is in no acute distress.  Skin: Skin is warm and dry. No rash noted.   Cardiovascular: Normal heart rate noted  Respiratory: Normal respiratory effort, no problems with respiration noted  Abdomen: Soft, gravid, appropriate for gestational age. Pain/Pressure: Present     Pelvic: Vag. Bleeding: None Vag D/C Character: Thin  . Neg pooling, neg fern and neg nitrazine.  Cervical exam performed      1.5/long, ballotable. Membranes felt.   Extremities: Normal range of motion.  Edema: Trace  Mental Status: Normal mood and affect. Normal behavior. Normal judgment and thought content.   Urinalysis:    Specimen not left.   Assessment and Plan:  Pregnancy: [id number] at [redacted]w[redacted]d  Supervision other high rist pregnancy  CHTN, normotensive today. No evidence of  Pre-E.  No evidence of ROM.    Vertex presentation. - Pt has scheduled repeat C/S, but would consider TOLAC if she comes in in active labor before then.   Term labor symptoms and general obstetric precautions including but not limited to vaginal bleeding, contractions, leaking of fluid and fetal movement were reviewed in detail with the patient. Please refer to After Visit Summary for other counseling recommendations.  Pre-E precautions.  Return in about 3 days (around 07/31/2015) for twice weekly testing.   Manya Silvas, CNM

## 2015-07-29 NOTE — Discharge Instructions (Signed)
Braxton Hicks Contractions °Contractions of the uterus can occur throughout pregnancy. Contractions are not always a sign that you are in labor.  °WHAT ARE BRAXTON HICKS CONTRACTIONS?  °Contractions that occur before labor are called Braxton Hicks contractions, or false labor. Toward the end of pregnancy (32-34 weeks), these contractions can develop more often and may become more forceful. This is not true labor because these contractions do not result in opening (dilatation) and thinning of the cervix. They are sometimes difficult to tell apart from true labor because these contractions can be forceful and people have different pain tolerances. You should not feel embarrassed if you go to the hospital with false labor. Sometimes, the only way to tell if you are in true labor is for your health care provider to look for changes in the cervix. °If there are no prenatal problems or other health problems associated with the pregnancy, it is completely safe to be sent home with false labor and await the onset of true labor. °HOW CAN YOU TELL THE DIFFERENCE BETWEEN TRUE AND FALSE LABOR? °False Labor °· The contractions of false labor are usually shorter and not as hard as those of true labor.   °· The contractions are usually irregular.   °· The contractions are often felt in the front of the lower abdomen and in the groin.   °· The contractions may go away when you walk around or change positions while lying down.   °· The contractions get weaker and are shorter lasting as time goes on.   °· The contractions do not usually become progressively stronger, regular, and closer together as with true labor.   °True Labor °· Contractions in true labor last 30-70 seconds, become very regular, usually become more intense, and increase in frequency.   °· The contractions do not go away with walking.   °· The discomfort is usually felt in the top of the uterus and spreads to the lower abdomen and low back.   °· True labor can be  determined by your health care provider with an exam. This will show that the cervix is dilating and getting thinner.   °WHAT TO REMEMBER °· Keep up with your usual exercises and follow other instructions given by your health care provider.   °· Take medicines as directed by your health care provider.   °· Keep your regular prenatal appointments.   °· Eat and drink lightly if you think you are going into labor.   °· If Braxton Hicks contractions are making you uncomfortable:   °¨ Change your position from lying down or resting to walking, or from walking to resting.   °¨ Sit and rest in a tub of warm water.   °¨ Drink 2-3 glasses of water. Dehydration may cause these contractions.   °¨ Do slow and deep breathing several times an hour.   °WHEN SHOULD I SEEK IMMEDIATE MEDICAL CARE? °Seek immediate medical care if: °· Your contractions become stronger, more regular, and closer together.   °· You have fluid leaking or gushing from your vagina.   °· You have a fever.   °· You pass blood-tinged mucus.   °· You have vaginal bleeding.   °· You have continuous abdominal pain.   °· You have low back pain that you never had before.   °· You feel your baby's head pushing down and causing pelvic pressure.   °· Your baby is not moving as much as it used to.   °  °This information is not intended to replace advice given to you by your health care provider. Make sure you discuss any questions you have with your health care   provider.   Document Released: 03/01/2005 Document Revised: 03/06/2013 Document Reviewed: 12/11/2012 Elsevier Interactive Patient Education 2016 [place]. Fetal Movement Counts Patient Name: __________________________________________________ Patient Due Date: ____________________ Performing a fetal movement count is highly recommended in high-risk pregnancies, but it is good for every pregnant woman to do. Your health care provider may ask you to start counting fetal movements at 28 weeks of the  pregnancy. Fetal movements often increase:  After eating a full meal.  After physical activity.  After eating or drinking something sweet or cold.  At rest. Pay attention to when you feel the baby is most active. This will help you notice a pattern of your baby's sleep and wake cycles and what factors contribute to an increase in fetal movement. It is important to perform a fetal movement count at the same time each day when your baby is normally most active.  HOW TO COUNT FETAL MOVEMENTS  Find a quiet and comfortable area to sit or lie down on your left side. Lying on your left side provides the best blood and oxygen circulation to your baby.  Write down the day and time on a sheet of paper or in a journal.  Start counting kicks, flutters, swishes, rolls, or jabs in a 2-hour period. You should feel at least 10 movements within 2 hours.  If you do not feel 10 movements in 2 hours, wait 2-3 hours and count again. Look for a change in the pattern or not enough counts in 2 hours. SEEK MEDICAL CARE IF:  You feel less than 10 counts in 2 hours, tried twice.  There is no movement in over an hour.  The pattern is changing or taking longer each day to reach 10 counts in 2 hours.  You feel the baby is not moving as he or she usually does. Date: ____________ Movements: ____________ Start time: ____________ Ann Brock time: ____________  Date: ____________ Movements: ____________ Start time: ____________ Ann Brock time: ____________ Date: ____________ Movements: ____________ Start time: ____________ Ann Brock time: ____________ Date: ____________ Movements: ____________ Start time: ____________ Ann Brock time: ____________ Date: ____________ Movements: ____________ Start time: ____________ Ann Brock time: ____________ Date: ____________ Movements: ____________ Start time: ____________ Ann Brock time: ____________ Date: ____________ Movements: ____________ Start time: ____________ Ann Brock time: ____________ Date:  ____________ Movements: ____________ Start time: ____________ Ann Brock time: ____________  Date: ____________ Movements: ____________ Start time: ____________ Ann Brock time: ____________ Date: ____________ Movements: ____________ Start time: ____________ Ann Brock time: ____________ Date: ____________ Movements: ____________ Start time: ____________ Ann Brock time: ____________ Date: ____________ Movements: ____________ Start time: ____________ Ann Brock time: ____________ Date: ____________ Movements: ____________ Start time: ____________ Ann Brock time: ____________ Date: ____________ Movements: ____________ Start time: ____________ Ann Brock time: ____________ Date: ____________ Movements: ____________ Start time: ____________ Ann Brock time: ____________  Date: ____________ Movements: ____________ Start time: ____________ Ann Brock time: ____________ Date: ____________ Movements: ____________ Start time: ____________ Ann Brock time: ____________ Date: ____________ Movements: ____________ Start time: ____________ Ann Brock time: ____________ Date: ____________ Movements: ____________ Start time: ____________ Ann Brock time: ____________ Date: ____________ Movements: ____________ Start time: ____________ Ann Brock time: ____________ Date: ____________ Movements: ____________ Start time: ____________ Ann Brock time: ____________ Date: ____________ Movements: ____________ Start time: ____________ Ann Brock time: ____________  Date: ____________ Movements: ____________ Start time: ____________ Ann Brock time: ____________ Date: ____________ Movements: ____________ Start time: ____________ Ann Brock time: ____________ Date: ____________ Movements: ____________ Start time: ____________ Ann Brock time: ____________ Date: ____________ Movements: ____________ Start time: ____________ Ann Brock time: ____________ Date: ____________ Movements: ____________ Start time: ____________ Ann Brock time: ____________ Date: ____________ Movements: ____________ Start  time: ____________ Ann Brock time:  ____________ Date: ____________ Movements: ____________ Start time: ____________ Ann Brock time: ____________  Date: ____________ Movements: ____________ Start time: ____________ Ann Brock time: ____________ Date: ____________ Movements: ____________ Start time: ____________ Ann Brock time: ____________ Date: ____________ Movements: ____________ Start time: ____________ Ann Brock time: ____________ Date: ____________ Movements: ____________ Start time: ____________ Ann Brock time: ____________ Date: ____________ Movements: ____________ Start time: ____________ Ann Brock time: ____________ Date: ____________ Movements: ____________ Start time: ____________ Ann Brock time: ____________ Date: ____________ Movements: ____________ Start time: ____________ Ann Brock time: ____________  Date: ____________ Movements: ____________ Start time: ____________ Ann Brock time: ____________ Date: ____________ Movements: ____________ Start time: ____________ Ann Brock time: ____________ Date: ____________ Movements: ____________ Start time: ____________ Ann Brock time: ____________ Date: ____________ Movements: ____________ Start time: ____________ Ann Brock time: ____________ Date: ____________ Movements: ____________ Start time: ____________ Ann Brock time: ____________ Date: ____________ Movements: ____________ Start time: ____________ Ann Brock time: ____________ Date: ____________ Movements: ____________ Start time: ____________ Ann Brock time: ____________  Date: ____________ Movements: ____________ Start time: ____________ Ann Brock time: ____________ Date: ____________ Movements: ____________ Start time: ____________ Ann Brock time: ____________ Date: ____________ Movements: ____________ Start time: ____________ Ann Brock time: ____________ Date: ____________ Movements: ____________ Start time: ____________ Ann Brock time: ____________ Date: ____________ Movements: ____________ Start time: ____________ Ann Brock time:  ____________ Date: ____________ Movements: ____________ Start time: ____________ Ann Brock time: ____________ Date: ____________ Movements: ____________ Start time: ____________ Ann Brock time: ____________  Date: ____________ Movements: ____________ Start time: ____________ Ann Brock time: ____________ Date: ____________ Movements: ____________ Start time: ____________ Ann Brock time: ____________ Date: ____________ Movements: ____________ Start time: ____________ Ann Brock time: ____________ Date: ____________ Movements: ____________ Start time: ____________ Ann Brock time: ____________ Date: ____________ Movements: ____________ Start time: ____________ Ann Brock time: ____________ Date: ____________ Movements: ____________ Start time: ____________ Ann Brock time: ____________   This information is not intended to replace advice given to you by your health care provider. Make sure you discuss any questions you have with your health care provider.   Document Released: 03/31/2006 Document Revised: 03/22/2014 Document Reviewed: 12/27/2011 Elsevier Interactive Patient Education [employer].

## 2015-07-29 NOTE — MAU Note (Signed)
Contractions started around 1400 and have progressively gotten worse.  Denies LOF/VB.

## 2015-07-31 ENCOUNTER — Ambulatory Visit (INDEPENDENT_AMBULATORY_CARE_PROVIDER_SITE_OTHER): Payer: BLUE CROSS/BLUE SHIELD

## 2015-07-31 VITALS — BP 116/80 | HR 105

## 2015-07-31 DIAGNOSIS — O133 Gestational [pregnancy-induced] hypertension without significant proteinuria, third trimester: Secondary | ICD-10-CM | POA: Diagnosis not present

## 2015-07-31 DIAGNOSIS — I10 Essential (primary) hypertension: Secondary | ICD-10-CM

## 2015-07-31 NOTE — H&P (Signed)
Ann Brock is an 30 y.o. [phone] [redacted]w[redacted]d female.   Chief Complaint: Previous C-section, breech presentation HPI: Patient with prior C-section and vaginal delivery, and breech presentation. Needs ERLTCS.  Past Medical History  Diagnosis Date  . Hyperemesis gravidarum   . Vaginal Pap smear, abnormal     repeat was normal  . Ectopic pregnancy     treated with methotrexate  . Hypertension     prior to preg, no meds  . Infection     UTI  . Chest pain     2014    Past Surgical History  Procedure Laterality Date  . Cholecystectomy    . Cesarean section    . Knee surgery    . Tonsillectomy and adenoidectomy      Family History  Problem Relation Age of Onset  . Hypertension Mother   . Depression Mother   . Drug abuse Father   . Heart disease Father   . Hyperlipidemia Father   . Hypertension Father   . Stroke Father   . Cancer Maternal Grandmother   . Diabetes Maternal Grandmother   . Stroke Maternal Grandmother   . Cancer Maternal Grandfather   . Stroke Maternal Grandfather   . Alcohol abuse Paternal Grandmother   . Cancer Paternal Grandmother   . Stroke Paternal Grandmother   . Alcohol abuse Paternal Grandfather   . Cancer Paternal Grandfather   . Stroke Paternal Grandfather   . Cerebral palsy Sister   . Kidney disease Sister   . Birth defects Brother     vsd   Social History:  reports that she has quit smoking. She has never used smokeless tobacco. She reports that she does not drink alcohol or use illicit drugs.    Allergies  Allergen Reactions  . Aspirin Anaphylaxis  . Cephalosporins Anaphylaxis  . Contrast Media  [Iodinated Diagnostic Agents] Anaphylaxis    No current facility-administered medications on file prior to encounter.   Current Outpatient Prescriptions on File Prior to Encounter  Medication Sig Dispense Refill  . acetaminophen (TYLENOL) 325 MG tablet Take 650 mg by mouth every 6 (six) hours as needed for headache.     Regino Schultze Bandages  & Supports (T.E.D. THIGH LENGTH/L-REGULAR) MISC 1 Device by Does not apply route daily. 2 each 0    A comprehensive review of systems was negative.  Last menstrual period 11/05/2014. General appearance: alert, cooperative and appears stated age Head: Normocephalic, without obvious abnormality, atraumatic Neck: supple, symmetrical, trachea midline Lungs: normal effort Heart: regular rate and rhythm, S1, S2 normal, no murmur, click, rub or gallop Abdomen: gravid, NT Extremities: Homans sign is negative, no sign of DVT Skin: Skin color, texture, turgor normal. No rashes or lesions Neurologic: Grossly normal   Lab Results  Component Value Date   WBC 6.3 05/12/2015   HGB 11.1* 05/12/2015   HCT 33.6* 05/12/2015   MCV 84.8 05/12/2015   PLT 176 05/12/2015         ABO, Rh: A/POS/-- (10/21 [id number])  Antibody: NEG (10/21 0918)  Rubella: !Error!  RPR: NON REAC (02/27 1113)  HBsAg: NEGATIVE (10/21 [id number])  HIV: NONREACTIVE (02/27 1113)  GBS:       Assessment/Plan Active Problems:   Chronic hypertension in pregnancy   History of cesarean delivery, currently pregnant   Breech presentation, antepartum  For ERLTCS. Risks include but are not limited to bleeding, infection, injury to surrounding structures, including bowel, bladder and ureters, blood clots, and death.  Likelihood of success is high.  Rayshawn Visconti S 07/31/2015, 4:00 PM

## 2015-08-04 ENCOUNTER — Encounter: Payer: Self-pay | Admitting: *Deleted

## 2015-08-04 ENCOUNTER — Encounter (HOSPITAL_COMMUNITY)
Admission: RE | Admit: 2015-08-04 | Discharge: 2015-08-04 | Disposition: A | Discharge status: Home / Self Care | Payer: BLUE CROSS/BLUE SHIELD | Source: Ambulatory Visit | Attending: Family Medicine | Admitting: Family Medicine

## 2015-08-04 ENCOUNTER — Ambulatory Visit (INDEPENDENT_AMBULATORY_CARE_PROVIDER_SITE_OTHER): Payer: BLUE CROSS/BLUE SHIELD | Admitting: Obstetrics & Gynecology

## 2015-08-04 ENCOUNTER — Encounter: Payer: Self-pay | Admitting: Obstetrics & Gynecology

## 2015-08-04 ENCOUNTER — Encounter (HOSPITAL_COMMUNITY): Payer: Self-pay

## 2015-08-04 VITALS — BP 128/73 | HR 88 | Wt 254.0 lb

## 2015-08-04 DIAGNOSIS — O321XX0 Maternal care for breech presentation, not applicable or unspecified: Secondary | ICD-10-CM

## 2015-08-04 DIAGNOSIS — O10913 Unspecified pre-existing hypertension complicating pregnancy, third trimester: Secondary | ICD-10-CM | POA: Diagnosis not present

## 2015-08-04 DIAGNOSIS — O0993 Supervision of high risk pregnancy, unspecified, third trimester: Secondary | ICD-10-CM

## 2015-08-04 DIAGNOSIS — O34219 Maternal care for unspecified type scar from previous cesarean delivery: Secondary | ICD-10-CM

## 2015-08-04 LAB — BASIC METABOLIC PANEL
ANION GAP: 8 (ref 5–15)
BUN: 6 mg/dL (ref 6–20)
CALCIUM: 8.6 mg/dL — AB (ref 8.9–10.3)
CO2: 25 mmol/L (ref 22–32)
Chloride: 105 mmol/L (ref 101–111)
Creatinine, Ser: 0.51 mg/dL (ref 0.44–1.00)
GFR calc Af Amer: >60 mL/min (ref >60)
GLUCOSE: 81 mg/dL (ref 65–99)
Potassium: 4 mmol/L (ref 3.5–5.1)
Sodium: 138 mmol/L (ref 135–145)

## 2015-08-04 LAB — CBC
HEMATOCRIT: 36.7 % (ref 36.0–46.0)
Hemoglobin: 12.1 g/dL (ref 12.0–15.0)
MCH: 27.4 pg (ref 26.0–34.0)
MCHC: 33 g/dL (ref 30.0–36.0)
MCV: 83 fL (ref 78.0–100.0)
PLATELETS: 174 10*3/uL (ref 150–400)
RBC: 4.42 MIL/uL (ref 3.87–5.11)
RDW: 14.4 % (ref 11.5–15.5)
WBC: 8.2 10*3/uL (ref 4.0–10.5)

## 2015-08-04 LAB — ABO/RH: ABO/RH(D): A POS

## 2015-08-04 MED ORDER — GENTAMICIN SULFATE 40 MG/ML IJ SOLN
INTRAVENOUS | Status: AC
  Administered 2015-08-05: 116 mL via INTRAVENOUS
  Filled 2015-08-04: qty 10.25

## 2015-08-04 NOTE — Progress Notes (Signed)
C/S scheduled for AM

## 2015-08-04 NOTE — Patient Instructions (Signed)
[place]  08/04/2015   Your procedure is scheduled on:  08/05/2015  Enter through the Main Entrance of [facility] [place] up the phone at the desk and dial [phone].   Call this number if you have problems the morning of surgery: [phone]   Remember:   Do not eat food:After Midnight.  Do not drink clear liquids: After Midnight.  Take these medicines the morning of surgery with A SIP OF WATER: none   Do not wear jewelry, make-up or nail polish.  Do not wear lotions, powders, or perfumes. You may wear deodorant.  Do not shave 48 hours prior to surgery.  Do not bring valuables to the hospital.  [facility] is not   responsible for any belongings or valuables brought to the hospital.  Contacts, dentures or bridgework may not be worn into surgery.  Leave suitcase in the car. After surgery it may be brought to your room.  For patients admitted to the hospital, checkout time is 11:00 AM the day of              discharge.   Patients discharged the day of surgery will not be allowed to drive             home.  Name and phone number of your driver: na  Special Instructions:   Shower using CHG 2 nights before surgery and the night before surgery.  If you shower the day of surgery use CHG.  Use special wash - you have one bottle of CHG for all showers.  You should use approximately 1/3 of the bottle for each shower.   Please read over the following fact sheets that you were given:   Surgical Site Infection Prevention

## 2015-08-05 ENCOUNTER — Inpatient Hospital Stay (HOSPITAL_COMMUNITY): Payer: BLUE CROSS/BLUE SHIELD | Admitting: Anesthesiology

## 2015-08-05 ENCOUNTER — Encounter (HOSPITAL_COMMUNITY)
Admission: RE | Disposition: A | Discharge status: Home / Self Care | Payer: Self-pay | Source: Ambulatory Visit | Attending: Family Medicine

## 2015-08-05 ENCOUNTER — Encounter (HOSPITAL_COMMUNITY): Payer: Self-pay | Admitting: Emergency Medicine

## 2015-08-05 ENCOUNTER — Inpatient Hospital Stay (HOSPITAL_COMMUNITY)
Admission: RE | Admit: 2015-08-05 | Discharge: 2015-08-07 | DRG: 765 | Disposition: A | Discharge status: Home / Self Care | Payer: BLUE CROSS/BLUE SHIELD | Source: Ambulatory Visit | Attending: Family Medicine | Admitting: Family Medicine

## 2015-08-05 DIAGNOSIS — Z6841 Body Mass Index (BMI) 40.0 and over, adult: Secondary | ICD-10-CM | POA: Diagnosis not present

## 2015-08-05 DIAGNOSIS — M79662 Pain in left lower leg: Secondary | ICD-10-CM

## 2015-08-05 DIAGNOSIS — Z3A39 39 weeks gestation of pregnancy: Secondary | ICD-10-CM

## 2015-08-05 DIAGNOSIS — O321XX0 Maternal care for breech presentation, not applicable or unspecified: Secondary | ICD-10-CM | POA: Diagnosis present

## 2015-08-05 DIAGNOSIS — O134 Gestational [pregnancy-induced] hypertension without significant proteinuria, complicating childbirth: Secondary | ICD-10-CM

## 2015-08-05 DIAGNOSIS — Z8249 Family history of ischemic heart disease and other diseases of the circulatory system: Secondary | ICD-10-CM

## 2015-08-05 DIAGNOSIS — O99214 Obesity complicating childbirth: Secondary | ICD-10-CM | POA: Diagnosis present

## 2015-08-05 DIAGNOSIS — Z87891 Personal history of nicotine dependence: Secondary | ICD-10-CM

## 2015-08-05 DIAGNOSIS — Z823 Family history of stroke: Secondary | ICD-10-CM | POA: Diagnosis not present

## 2015-08-05 DIAGNOSIS — O874 Varicose veins of lower extremity in the puerperium: Secondary | ICD-10-CM | POA: Diagnosis present

## 2015-08-05 DIAGNOSIS — K219 Gastro-esophageal reflux disease without esophagitis: Secondary | ICD-10-CM | POA: Diagnosis present

## 2015-08-05 DIAGNOSIS — O0993 Supervision of high risk pregnancy, unspecified, third trimester: Secondary | ICD-10-CM

## 2015-08-05 DIAGNOSIS — Z98891 History of uterine scar from previous surgery: Secondary | ICD-10-CM

## 2015-08-05 DIAGNOSIS — Z841 Family history of disorders of kidney and ureter: Secondary | ICD-10-CM | POA: Diagnosis not present

## 2015-08-05 DIAGNOSIS — O34211 Maternal care for low transverse scar from previous cesarean delivery: Secondary | ICD-10-CM

## 2015-08-05 DIAGNOSIS — O9962 Diseases of the digestive system complicating childbirth: Secondary | ICD-10-CM | POA: Diagnosis present

## 2015-08-05 DIAGNOSIS — O10919 Unspecified pre-existing hypertension complicating pregnancy, unspecified trimester: Secondary | ICD-10-CM | POA: Diagnosis present

## 2015-08-05 DIAGNOSIS — O10913 Unspecified pre-existing hypertension complicating pregnancy, third trimester: Secondary | ICD-10-CM

## 2015-08-05 DIAGNOSIS — Z818 Family history of other mental and behavioral disorders: Secondary | ICD-10-CM

## 2015-08-05 DIAGNOSIS — Z833 Family history of diabetes mellitus: Secondary | ICD-10-CM

## 2015-08-05 DIAGNOSIS — O1002 Pre-existing essential hypertension complicating childbirth: Secondary | ICD-10-CM | POA: Diagnosis present

## 2015-08-05 DIAGNOSIS — O328XX0 Maternal care for other malpresentation of fetus, not applicable or unspecified: Secondary | ICD-10-CM

## 2015-08-05 DIAGNOSIS — O34219 Maternal care for unspecified type scar from previous cesarean delivery: Secondary | ICD-10-CM

## 2015-08-05 LAB — RPR: RPR: NONREACTIVE

## 2015-08-05 LAB — PREPARE RBC (CROSSMATCH)

## 2015-08-05 SURGERY — Surgical Case
Anesthesia: Spinal

## 2015-08-05 MED ORDER — DIPHENHYDRAMINE HCL 25 MG PO CAPS
25.0000 mg | ORAL_CAPSULE | ORAL | Status: DC | PRN
  Filled 2015-08-05: qty 1

## 2015-08-05 MED ORDER — ACETAMINOPHEN 500 MG PO TABS
1000.0000 mg | ORAL_TABLET | Freq: Four times a day (QID) | ORAL | Status: AC
  Administered 2015-08-05 – 2015-08-06 (×3): 1000 mg via ORAL
  Filled 2015-08-05 (×3): qty 2

## 2015-08-05 MED ORDER — DIPHENHYDRAMINE HCL 25 MG PO CAPS: 25.0000 mg | ORAL_CAPSULE | Freq: Four times a day (QID) | ORAL | Status: DC | PRN

## 2015-08-05 MED ORDER — MEPERIDINE HCL 25 MG/ML IJ SOLN: 6.2500 mg | INTRAMUSCULAR | Status: DC | PRN

## 2015-08-05 MED ORDER — SENNOSIDES-DOCUSATE SODIUM 8.6-50 MG PO TABS
2.0000 | ORAL_TABLET | ORAL | Status: DC
  Administered 2015-08-06 – 2015-08-07 (×2): 2 via ORAL
  Filled 2015-08-05 (×2): qty 2

## 2015-08-05 MED ORDER — SIMETHICONE 80 MG PO CHEW
80.0000 mg | CHEWABLE_TABLET | ORAL | Status: DC
  Administered 2015-08-06 – 2015-08-07 (×2): 80 mg via ORAL
  Filled 2015-08-05 (×2): qty 1

## 2015-08-05 MED ORDER — NALOXONE HCL 2 MG/2ML IJ SOSY
1.0000 ug/kg/h | PREFILLED_SYRINGE | INTRAVENOUS | Status: DC | PRN
  Filled 2015-08-05: qty 2

## 2015-08-05 MED ORDER — OXYCODONE HCL 5 MG PO TABS
5.0000 mg | ORAL_TABLET | ORAL | Status: DC | PRN
  Administered 2015-08-06 – 2015-08-07 (×4): 5 mg via ORAL
  Filled 2015-08-05 (×4): qty 1

## 2015-08-05 MED ORDER — DIPHENHYDRAMINE HCL 50 MG/ML IJ SOLN: 12.5000 mg | INTRAMUSCULAR | Status: DC | PRN

## 2015-08-05 MED ORDER — FENTANYL CITRATE (PF) 100 MCG/2ML IJ SOLN
INTRAMUSCULAR | Status: AC
  Filled 2015-08-05: qty 2

## 2015-08-05 MED ORDER — NALBUPHINE HCL 10 MG/ML IJ SOLN: 5.0000 mg | Freq: Once | INTRAMUSCULAR | Status: DC | PRN

## 2015-08-05 MED ORDER — PHENYLEPHRINE 8 MG IN D5W 100 ML (0.08MG/ML) PREMIX OPTIME
INJECTION | INTRAVENOUS | Status: DC | PRN
  Administered 2015-08-05: 60 ug/min via INTRAVENOUS

## 2015-08-05 MED ORDER — OXYCODONE HCL 5 MG PO TABS
10.0000 mg | ORAL_TABLET | ORAL | Status: DC | PRN
  Administered 2015-08-06: 10 mg via ORAL
  Filled 2015-08-05: qty 2

## 2015-08-05 MED ORDER — LACTATED RINGERS IV SOLN
INTRAVENOUS | Status: DC
  Administered 2015-08-05: 20:00:00 via INTRAVENOUS

## 2015-08-05 MED ORDER — OXYTOCIN 10 UNIT/ML IJ SOLN
INTRAMUSCULAR | Status: AC
  Filled 2015-08-05: qty 4

## 2015-08-05 MED ORDER — MORPHINE SULFATE (PF) 0.5 MG/ML IJ SOLN
INTRAMUSCULAR | Status: DC | PRN
  Administered 2015-08-05: .2 mg via INTRATHECAL

## 2015-08-05 MED ORDER — SODIUM CHLORIDE 0.9% FLUSH: 3.0000 mL | INTRAVENOUS | Status: DC | PRN

## 2015-08-05 MED ORDER — FENTANYL CITRATE (PF) 100 MCG/2ML IJ SOLN
INTRAMUSCULAR | Status: DC | PRN
  Administered 2015-08-05: 20 ug via INTRATHECAL

## 2015-08-05 MED ORDER — MENTHOL 3 MG MT LOZG: 1.0000 | LOZENGE | OROMUCOSAL | Status: DC | PRN

## 2015-08-05 MED ORDER — ONDANSETRON HCL 4 MG/2ML IJ SOLN
INTRAMUSCULAR | Status: DC | PRN
  Administered 2015-08-05: 4 mg via INTRAVENOUS

## 2015-08-05 MED ORDER — SCOPOLAMINE 1 MG/3DAYS TD PT72
MEDICATED_PATCH | TRANSDERMAL | Status: AC
  Administered 2015-08-05: 1.5 mg via TRANSDERMAL
  Filled 2015-08-05: qty 1

## 2015-08-05 MED ORDER — PHENYLEPHRINE 8 MG IN D5W 100 ML (0.08MG/ML) PREMIX OPTIME
INJECTION | INTRAVENOUS | Status: AC
  Filled 2015-08-05: qty 100

## 2015-08-05 MED ORDER — SCOPOLAMINE 1 MG/3DAYS TD PT72
1.0000 | MEDICATED_PATCH | Freq: Once | TRANSDERMAL | Status: DC
  Administered 2015-08-05: 1.5 mg via TRANSDERMAL

## 2015-08-05 MED ORDER — SODIUM CHLORIDE 0.9 % IR SOLN
Status: DC | PRN
  Administered 2015-08-05: 1

## 2015-08-05 MED ORDER — COCONUT OIL OIL: 1.0000 "application " | TOPICAL_OIL | Status: DC | PRN

## 2015-08-05 MED ORDER — BUPIVACAINE HCL (PF) 0.25 % IJ SOLN
INTRAMUSCULAR | Status: AC
  Filled 2015-08-05: qty 30

## 2015-08-05 MED ORDER — DIBUCAINE 1 % RE OINT: 1.0000 "application " | TOPICAL_OINTMENT | RECTAL | Status: DC | PRN

## 2015-08-05 MED ORDER — OXYTOCIN 40 UNITS IN LACTATED RINGERS INFUSION - SIMPLE MED: 2.5000 [IU]/h | INTRAVENOUS | Status: AC

## 2015-08-05 MED ORDER — SIMETHICONE 80 MG PO CHEW
80.0000 mg | CHEWABLE_TABLET | Freq: Three times a day (TID) | ORAL | Status: DC
  Administered 2015-08-05 – 2015-08-07 (×5): 80 mg via ORAL
  Filled 2015-08-05 (×5): qty 1

## 2015-08-05 MED ORDER — NALBUPHINE HCL 10 MG/ML IJ SOLN: 5.0000 mg | INTRAMUSCULAR | Status: DC | PRN

## 2015-08-05 MED ORDER — ONDANSETRON HCL 4 MG/2ML IJ SOLN: 4.0000 mg | Freq: Three times a day (TID) | INTRAMUSCULAR | Status: DC | PRN

## 2015-08-05 MED ORDER — BUPIVACAINE HCL (PF) 0.25 % IJ SOLN
INTRAMUSCULAR | Status: DC | PRN
  Administered 2015-08-05: 30 mL

## 2015-08-05 MED ORDER — MORPHINE SULFATE (PF) 0.5 MG/ML IJ SOLN
INTRAMUSCULAR | Status: AC
  Filled 2015-08-05: qty 10

## 2015-08-05 MED ORDER — SCOPOLAMINE 1 MG/3DAYS TD PT72: 1.0000 | MEDICATED_PATCH | Freq: Once | TRANSDERMAL | Status: DC

## 2015-08-05 MED ORDER — MEPERIDINE HCL 25 MG/ML IJ SOLN
INTRAMUSCULAR | Status: DC | PRN
  Administered 2015-08-05 (×2): 12.5 mg via INTRAVENOUS

## 2015-08-05 MED ORDER — BUPIVACAINE IN DEXTROSE 0.75-8.25 % IT SOLN
INTRATHECAL | Status: DC | PRN
  Administered 2015-08-05: 10.5 mg via INTRATHECAL

## 2015-08-05 MED ORDER — HYDROMORPHONE HCL 1 MG/ML IJ SOLN
0.2500 mg | INTRAMUSCULAR | Status: DC | PRN
  Administered 2015-08-05 (×2): 0.25 mg via INTRAVENOUS

## 2015-08-05 MED ORDER — ONDANSETRON HCL 4 MG/2ML IJ SOLN
INTRAMUSCULAR | Status: AC
  Filled 2015-08-05: qty 2

## 2015-08-05 MED ORDER — OXYTOCIN 10 UNIT/ML IJ SOLN
40.0000 [IU] | INTRAVENOUS | Status: DC | PRN
  Administered 2015-08-05: 40 [IU] via INTRAVENOUS

## 2015-08-05 MED ORDER — WITCH HAZEL-GLYCERIN EX PADS: 1.0000 "application " | MEDICATED_PAD | CUTANEOUS | Status: DC | PRN

## 2015-08-05 MED ORDER — ACETAMINOPHEN 325 MG PO TABS
650.0000 mg | ORAL_TABLET | ORAL | Status: DC | PRN
  Administered 2015-08-07: 650 mg via ORAL
  Filled 2015-08-05: qty 2

## 2015-08-05 MED ORDER — LACTATED RINGERS IV SOLN
INTRAVENOUS | Status: DC
  Administered 2015-08-05 (×3): via INTRAVENOUS

## 2015-08-05 MED ORDER — NALOXONE HCL 0.4 MG/ML IJ SOLN: 0.4000 mg | INTRAMUSCULAR | Status: DC | PRN

## 2015-08-05 MED ORDER — ONDANSETRON HCL 4 MG/2ML IJ SOLN: 4.0000 mg | Freq: Once | INTRAMUSCULAR | Status: DC | PRN

## 2015-08-05 MED ORDER — SIMETHICONE 80 MG PO CHEW: 80.0000 mg | CHEWABLE_TABLET | ORAL | Status: DC | PRN

## 2015-08-05 MED ORDER — PRENATAL MULTIVITAMIN CH
1.0000 | ORAL_TABLET | Freq: Every day | ORAL | Status: DC
  Administered 2015-08-06: 1 via ORAL
  Filled 2015-08-05: qty 1

## 2015-08-05 MED ORDER — TETANUS-DIPHTH-ACELL PERTUSSIS 5-2.5-18.5 LF-MCG/0.5 IM SUSP: 0.5000 mL | Freq: Once | INTRAMUSCULAR | Status: DC

## 2015-08-05 MED ORDER — HYDROMORPHONE HCL 1 MG/ML IJ SOLN
INTRAMUSCULAR | Status: AC
  Filled 2015-08-05: qty 1

## 2015-08-05 MED ORDER — MEPERIDINE HCL 25 MG/ML IJ SOLN
INTRAMUSCULAR | Status: AC
  Filled 2015-08-05: qty 1

## 2015-08-05 MED ORDER — ZOLPIDEM TARTRATE 5 MG PO TABS: 5.0000 mg | ORAL_TABLET | Freq: Every evening | ORAL | Status: DC | PRN

## 2015-08-05 MED ORDER — LACTATED RINGERS IV SOLN
Freq: Once | INTRAVENOUS | Status: AC
  Administered 2015-08-05: 09:00:00 via INTRAVENOUS

## 2015-08-05 SURGICAL SUPPLY — 29 items
BENZOIN TINCTURE PRP APPL 2/3 (GAUZE/BANDAGES/DRESSINGS) ×2 IMPLANT
CLAMP CORD UMBIL (MISCELLANEOUS) IMPLANT
CLOTH BEACON ORANGE TIMEOUT ST (SAFETY) ×2 IMPLANT
DECANTER SPIKE VIAL GLASS SM (MISCELLANEOUS) ×2 IMPLANT
DRSG OPSITE POSTOP 4X10 (GAUZE/BANDAGES/DRESSINGS) ×2 IMPLANT
DURAPREP 26ML APPLICATOR (WOUND CARE) ×2 IMPLANT
EXTRACTOR VACUUM M CUP 4 TUBE (SUCTIONS) IMPLANT
GLOVE BIOGEL PI IND STRL 7.0 (GLOVE) ×2 IMPLANT
GLOVE BIOGEL PI INDICATOR 7.0 (GLOVE) ×2
GLOVE ECLIPSE 7.0 STRL STRAW (GLOVE) ×4 IMPLANT
GOWN STRL REUS W/TWL LRG LVL3 (GOWN DISPOSABLE) ×4 IMPLANT
KIT ABG SYR 3ML LUER SLIP (SYRINGE) IMPLANT
NEEDLE HYPO 22GX1.5 SAFETY (NEEDLE) ×2 IMPLANT
NEEDLE HYPO 25X5/8 SAFETYGLIDE (NEEDLE) IMPLANT
NS IRRIG 1000ML POUR BTL (IV SOLUTION) ×2 IMPLANT
PACK C SECTION WH (CUSTOM PROCEDURE TRAY) ×2 IMPLANT
PAD ABD 7.5X8 STRL (GAUZE/BANDAGES/DRESSINGS) ×2 IMPLANT
PENCIL SMOKE EVAC W/HOLSTER (ELECTROSURGICAL) ×2 IMPLANT
RTRCTR C-SECT PINK 25CM LRG (MISCELLANEOUS) ×2 IMPLANT
SPONGE GAUZE 4X4 12PLY STER LF (GAUZE/BANDAGES/DRESSINGS) ×4 IMPLANT
STRIP CLOSURE SKIN 1/2X4 (GAUZE/BANDAGES/DRESSINGS) ×4 IMPLANT
SUT PLAIN 2 0 XLH (SUTURE) ×2 IMPLANT
SUT VIC AB 0 CT1 27 (SUTURE) ×1
SUT VIC AB 0 CT1 27XBRD ANBCTR (SUTURE) ×1 IMPLANT
SUT VIC AB 0 CTX 36 (SUTURE) ×3
SUT VIC AB 0 CTX36XBRD ANBCTRL (SUTURE) ×3 IMPLANT
SUT VIC AB 4-0 KS 27 (SUTURE) ×2 IMPLANT
SYR 30ML LL (SYRINGE) ×2 IMPLANT
TOWEL OR 17X24 6PK STRL BLUE (TOWEL DISPOSABLE) ×2 IMPLANT

## 2015-08-05 NOTE — Transfer of Care (Signed)
Immediate Anesthesia Transfer of Care Note  Patient: Ann Brock  Procedure(s) Performed: Procedure(s): REPEAT CESAREAN SECTION  (N/A)  Patient Location: PACU  Anesthesia Type:Spinal  Level of Consciousness: awake, alert , oriented and patient cooperative  Airway & Oxygen Therapy: Patient Spontanous Breathing  Post-op Assessment: Report given to RN and Post -op Vital signs reviewed and stable  Post vital signs: Reviewed and stable  Last Vitals:  Filed Vitals:   08/05/15 0809  BP: 113/76  Pulse: 98  Temp: 36.8 C    Last Pain: There were no vitals filed for this visit.       Complications: No apparent anesthesia complications

## 2015-08-05 NOTE — Op Note (Signed)
Cesarean Section Operative Report  Angelus Conca  08/05/2015  Indications: Scheduled Proceedure/Maternal Request   Pre-operative Diagnosis:  REPEAT C-section.   Post-operative Diagnosis: Same   Surgeon: Surgeon(s) and Role:    * Donnamae Jude, MD - Primary    * Gwynne Edinger, MD - Assisting   Attending Attestation: I was present and scrubbed for the entire procedure.   Assistants: none  Anesthesia: spinal    Estimated Blood Loss: 600 ml  Total IV Fluids: 2400 ml LR  Urine Output:: 100 ml clear yellow urine  Specimens: none  Findings: Viable female infant in cephalic presentation; Apgars 9/10; weight 3670 g; arterial cord pH not obtained;clear amniotic fluid; intact placenta with three vessel cord; normal uterus, fallopian tubes and ovaries bilaterally. No significant adhesive disease.  Baby condition / location:  Couplet care / Skin to Skin   Complications: no complications  Indications: Ann Brock is a 30 y.o. [id number] with an IUP [redacted]w[redacted]d presenting for elective repeat c/s. Had recently been breech, but on exam today cephalic. Patient requested elective repeat..  The risks, benefits, complications, treatment options, and expected outcomes were discussed with the patient . The patient concurred with the proposed plan, giving informed consent. identified as [name] and the procedure verified as C-Section Delivery.  Procedure Details:  The patient was taken back to the operative suite where spinal anesthesia was placed.  A time out was held and the above information confirmed.   After induction of anesthesia, the patient was draped and prepped in the usual sterile manner and placed in a dorsal supine position with a leftward tilt. A Pfannenstiel incision was made and carried down through the subcutaneous tissue to the fascia. Fascial incision was made and sharply extended transversely. The fascia was separated from the underlying rectus tissue  superiorly and inferiorly. The peritoneum was identified and sharply entered and extended longitudinally. Alexis retractor was placed. A low transverse uterine incision was made and extended bluntly. Delivered from cephalic presentation with vacuum-assistance (no pop-offs) was a viable infant with Apgars and weight as above. The umbilical cord was clamped and cut cord blood was obtained for evaluation. Cord ph was not sent. The placenta was removed Intact and appeared normal. T The uterine incision was closed with running locked sutures of 0Vicryl with an imbricating layer of the same.   Hemostasis was observed. The peritoneum was closed with 0 vicryl. The rectus muscles were examined and hemostasis observed. The fascia was then reapproximated with running sutures of 0Vicryl. A total of  30 ml 0.25% Marcaine was injected subcutaneously at the margins of the incision. The subcuticular closure was performed using 2-0plain gut. The skin was closed with 4-0Vicryl.   Instrument, sponge, and needle counts were correct prior the abdominal closure and were correct at the conclusion of the case.     Disposition: PACU - hemodynamically stable.   Maternal Condition: stable       Signed: Ennis Forts 08/05/2015 10:48 AM

## 2015-08-05 NOTE — Anesthesia Preprocedure Evaluation (Signed)
Anesthesia Evaluation  Patient identified by MRN, date of birth, ID band Patient awake    Reviewed: Allergy & Precautions, H&P , NPO status , Patient's Chart, lab work & pertinent test results  Airway Mallampati: II  TM Distance: >3 FB Neck ROM: full    Dental no notable dental hx.    Pulmonary former smoker,    Pulmonary exam normal        Cardiovascular hypertension, negative cardio ROS Normal cardiovascular exam     Neuro/Psych negative neurological ROS  negative psych ROS   GI/Hepatic Neg liver ROS, GERD  Medicated and Controlled,  Endo/Other  Morbid obesity  Renal/GU negative Renal ROS     Musculoskeletal   Abdominal (+) + obese,   Peds  Hematology negative hematology ROS (+)   Anesthesia Other Findings   Reproductive/Obstetrics (+) Pregnancy                             Anesthesia Physical Anesthesia Plan  ASA: III  Anesthesia Plan: Spinal   Post-op Pain Management:    Induction:   Airway Management Planned:   Additional Equipment:   Intra-op Plan:   Post-operative Plan:   Informed Consent: I have reviewed the patients History and Physical, chart, labs and discussed the procedure including the risks, benefits and alternatives for the proposed anesthesia with the patient or authorized representative who has indicated his/her understanding and acceptance.     Plan Discussed with: CRNA and Surgeon  Anesthesia Plan Comments:         Anesthesia Quick Evaluation

## 2015-08-05 NOTE — Anesthesia Postprocedure Evaluation (Signed)
Anesthesia Post Note  Patient: Michale Lawwill  Procedure(s) Performed: Procedure(s) (LRB): REPEAT CESAREAN SECTION  (N/A)  Patient location during evaluation: Mother Baby Anesthesia Type: Spinal Level of consciousness: oriented and awake and alert Pain management: pain level controlled Vital Signs Assessment: post-procedure vital signs reviewed and stable Respiratory status: spontaneous breathing and nonlabored ventilation Cardiovascular status: stable Postop Assessment: spinal receding, patient able to bend at knees, no signs of nausea or vomiting and adequate PO intake Anesthetic complications: no     Last Vitals:  Filed Vitals:   08/05/15 1325 08/05/15 1425  BP: 107/64 118/61  Pulse: 74 73  Temp: 36.6 C 36.8 C  Resp: 18 18    Last Pain:  Filed Vitals:   08/05/15 1619  PainSc: 4    Pain Goal: Patients Stated Pain Goal: 2 (08/05/15 1536)               France Ravens Hristova

## 2015-08-05 NOTE — Anesthesia Procedure Notes (Signed)
Spinal Patient location during procedure: OR Start time: 08/05/2015 9:35 AM End time: 08/05/2015 9:38 AM Staffing Anesthesiologist: Lyn Hollingshead Performed by: anesthesiologist  Preanesthetic Checklist Completed: patient identified, site marked, surgical consent, pre-op evaluation, timeout performed, IV checked, risks and benefits discussed and monitors and equipment checked Spinal Block Patient position: sitting Prep: DuraPrep Patient monitoring: heart rate, cardiac monitor, continuous pulse ox and blood pressure Approach: midline Location: L3-4 Injection technique: single-shot Needle Needle type: Sprotte  Needle gauge: 24 G Needle length: 9 cm Needle insertion depth: 7 cm Assessment Sensory level: T8

## 2015-08-05 NOTE — Lactation Note (Signed)
This note was copied from a baby's chart. Lactation Consultation Note  Patient Name: [name] [id number] Date: 08/05/2015 Reason for consult: Initial assessment Baby at 8 hr of life. Experienced bf mom reports latching is going well. She denies breast or nipple pain, voiced no concerns. Discussed baby behavior, feeding frequency, baby belly size, voids, wt loss, breast changes, and nipple care. She stated she can manually express, has seen colostrum, and has a spoon in the room. Given lactation handouts. Aware of OP services and support group.    Maternal Data    Feeding Feeding Type: Breast Fed  LATCH Score/Interventions                      Lactation Tools Discussed/Used WIC Program: Yes   Consult Status Consult Status: Follow-up Date: 08/06/15 Follow-up type: In-patient    Denzil Hughes 08/05/2015, 6:21 PM

## 2015-08-05 NOTE — Progress Notes (Signed)
Attempted ambulation patient is dizzy

## 2015-08-05 NOTE — Interval H&P Note (Signed)
History and Physical Interval Note:  08/05/2015 9:21 AM  Ann Brock  has presented today for surgery, with the diagnosis of  REPEAT C-section  The various methods of treatment have been discussed with the patient and family. After consideration of risks, benefits and other options for treatment, the patient has consented to  Procedure(s): REPEAT CESAREAN SECTION  (N/A) as a surgical intervention .  The patient's history has been reviewed, patient examined, no change in status, stable for surgery.  I have reviewed the patient's chart and labs.  Questions were answered to the patient's satisfaction.     [place]

## 2015-08-05 NOTE — Addendum Note (Signed)
Addendum  created 08/05/15 1703 by Hewitt Blade, CRNA   Modules edited: Clinical Notes   Clinical Notes:  File: [id number]

## 2015-08-05 NOTE — Anesthesia Postprocedure Evaluation (Signed)
Anesthesia Post Note  Patient: Sherah Romney  Procedure(s) Performed: Procedure(s) (LRB): REPEAT CESAREAN SECTION  (N/A)  Patient location during evaluation: PACU Anesthesia Type: Spinal Level of consciousness: awake Pain management: pain level controlled Respiratory status: spontaneous breathing Cardiovascular status: stable Postop Assessment: no headache, no backache, spinal receding, patient able to bend at knees and no signs of nausea or vomiting Anesthetic complications: no     Last Vitals:  Filed Vitals:   08/05/15 1200 08/05/15 1218  BP: 107/64 115/65  Pulse: 58 70  Temp: 36.8 C 36.8 C  Resp: 20 18    Last Pain:  Filed Vitals:   08/05/15 1219  PainSc: 0-No pain   Pain Goal:                 Merry Pond JR,JOHN Shirley Bolle

## 2015-08-06 ENCOUNTER — Inpatient Hospital Stay (HOSPITAL_COMMUNITY): Payer: BLUE CROSS/BLUE SHIELD

## 2015-08-06 DIAGNOSIS — M79662 Pain in left lower leg: Secondary | ICD-10-CM

## 2015-08-06 LAB — CBC
HEMATOCRIT: 34.4 % — AB (ref 36.0–46.0)
HEMOGLOBIN: 11 g/dL — AB (ref 12.0–15.0)
MCH: 26.7 pg (ref 26.0–34.0)
MCHC: 32 g/dL (ref 30.0–36.0)
MCV: 83.5 fL (ref 78.0–100.0)
Platelets: 148 10*3/uL — ABNORMAL LOW (ref 150–400)
RBC: 4.12 MIL/uL (ref 3.87–5.11)
RDW: 14.5 % (ref 11.5–15.5)
WBC: 7 10*3/uL (ref 4.0–10.5)

## 2015-08-06 NOTE — Progress Notes (Signed)
Post Partum Day 1, POD1 Subjective:  Ann Brock is a 30 y.o. [id number] [redacted]w[redacted]d s/p RLTCS. Pregnancy was complicated by chronic HTN. No acute events overnight.  Pt denies problems with ambulating, voiding or po intake.  She denies nausea or vomiting.  Pain is well controlled.  She has had flatus.  Lochia Minimal.  Plan for birth control is Nexplanon.  Method of Feeding: breastfeeding.  Objective: Blood pressure 110/63, pulse 76, temperature 97.9 F (36.6 C), temperature source Oral, resp. rate 18, last menstrual period 11/05/2014, SpO2 98 %, unknown if currently breastfeeding.  Physical Exam:  General: alert, cooperative and no distress Chest: normal WOB Heart: Regular rate Abdomen: +BS, soft DVT Evaluation: no evidence of DVT seen on physical exam. Extremities: no edema   Recent Labs  08/04/15 1150 08/06/15 0603  HGB 12.1 11.0*  HCT 36.7 34.4*    Assessment/Plan:  ASSESSMENT: Ann Brock is a 30 y.o. [id number] [redacted]w[redacted]d s/p RLTCS.   #Chronic Hypertension: BP has been within normal ranges. She has not been on medications, and none is indicated at this time. -recheck BP at next follow up clinic visit  #Postpartum Care Plan for discharge tomorrow Continue routine PP care Breastfeeding support PRN  Milon Score, medical student  OB fellow attestation Post Partum Day 1/POD#1 I have seen and examined this patient and agree with above documentation in the medical student's note.   Ann Brock is a 30 y.o. [id number] s/p rlTCS.  Pt denies problems with ambulating, voiding or po intake. Pain is well controlled.  Plan for birth control is Nexplanon.  Method of Feeding: breast  PE:  BP 126/75 mmHg  Pulse 64  Temp(Src) 97.8 F (36.6 C) (Oral)  Resp 18  SpO2 100%  LMP 11/05/2014 (Exact Date)  Breastfeeding? Unknown Gen: well appearing Heart: reg rate Lungs: normal WOB Fundus firm Ext: soft, no pain, no edema  Plan for discharge: POD#2 or #3 Continue current  routine care  #cHTN: currently well controlled. needs BP check at 72 hrs ppd and 1 week.   Caren Macadam, MD 11:40 AM

## 2015-08-06 NOTE — Progress Notes (Signed)
VASCULAR LAB PRELIMINARY  PRELIMINARY  PRELIMINARY  PRELIMINARY  Bilateral lower extremity venous duplex completed.    Preliminary report:  Bilateral:  No evidence of DVT, superficial thrombosis, or Baker's Cyst.   Tanvir Hipple, RVS 08/06/2015, 5:10 PM

## 2015-08-06 NOTE — Progress Notes (Signed)
Pt complaining of leg soreness. Left lower leg behind her knee she states is hot and sore. And also the right upper leg above the knee is sore. Left lower leg is edemanous and pt also has several varicosities. Dr. Cyndia Skeeters notified and will be in to examine patient.  [place], [place] Spoke with Dr. Si Raider and Pt. Will be having doppler studies. Will continue to evaluate patient for other respiratory symptoms. Rozanna Box, RN

## 2015-08-06 NOTE — Progress Notes (Signed)
POD#0 Subjective:  Ann Brock is a 30 y.o. [id number] [redacted]w[redacted]d s/p C/S Went to see a patient in response to a call from her nurse about leg pain. Patient with history of varicose pain complaining left leg pain. She says her pain is different from her usual varicose vein. She denies chest pain or shortness of breath. She reports family history of blood clot (father, PGF and MGF)  Objective: Blood pressure 91/51, pulse 49, temperature 98.2 F (36.8 C), temperature source Oral, resp. rate 18, last menstrual period 11/05/2014, SpO2 100 %, unknown if currently breastfeeding.  Physical Exam:  [id number] in bed, pleasant, appears well MSK:   LLE: appears the same size as right. Feels warm and tender to palpation over posterolateral aspect of her calf. No skin color changes except for baseline varicose veins bilaterally. Calf circ about 45 cm  RLE: Appears the same size as left. Appreciate varicose vein to her thigh. No warmth or tenderness in her calf but bruise.echymosis on her right thigh anteriorly. No skin color changes except for the varicose veins in both legs. Calf circ about 44 cm   Recent Labs  08/04/15 1150 08/06/15 0603  HGB 12.1 11.0*  HCT 36.7 34.4*    Assessment/Plan:  ASSESSMENT: Ann Brock is a 30 y.o. [id number] [redacted]w[redacted]d s/p C/S now with left leg pain concerning for DVT. This could also be thrombophlebitis given history of varicose veins. Patient at increased risk for DVT due to pregnancy, surgery and significant family history. -Will order doppler US given her risk. Wells score 2-3 -Will monitor closely for resp symptoms  LOS: 1 day   Mercy Riding 08/06/2015, 3:37 PM

## 2015-08-07 MED ORDER — SENNOSIDES-DOCUSATE SODIUM 8.6-50 MG PO TABS: 2.0000 | ORAL_TABLET | ORAL | Status: DC

## 2015-08-07 MED ORDER — OXYCODONE HCL 5 MG PO TABS: 5.0000 mg | ORAL_TABLET | Freq: Four times a day (QID) | ORAL | Status: DC | PRN

## 2015-08-07 NOTE — Lactation Note (Signed)
This note was copied from a baby's chart. Lactation Consultation Note  Patient Name: Ann Brock [id number] Date: 08/07/2015 Reason for consult: Follow-up assessment;Infant weight loss   Spoke with Roselyn Reef, RN to let her know mom needs feeding assessment prior to d/c. Mom and infant have D/C orders.    Maternal Data Formula Feeding for Exclusion: No Does the patient have breastfeeding experience prior to this delivery?: No  Feeding Feeding Type: Breast Fed Length of feed: 25 min  LATCH Score/Interventions                      Lactation Tools Discussed/Used WIC Program: No Pump Review: Milk Storage   Consult Status Consult Status: Follow-up Date: 08/07/15 Follow-up type: In-patient    Debby Freiberg Nyeem Stoke 08/07/2015, 10:19 AM

## 2015-08-07 NOTE — Discharge Instructions (Signed)
Cesarean Delivery, Care After  Refer to this sheet in the next few weeks. These instructions provide you with information on caring for yourself after your procedure. Your health care provider may also give you specific instructions. Your treatment has been planned according to current medical practices, but problems sometimes occur. Call your health care provider if you have any problems or questions after you go home.  HOME CARE INSTRUCTIONS   Only take over-the-counter or prescription medications as directed by your health care provider.   Do not drink alcohol, especially if you are breastfeeding or taking medication to relieve pain.   Do not chew or smoke tobacco.   Continue to use good perineal care. Good perineal care includes:    Wiping your perineum from front to back.    Keeping your perineum clean.   Check your surgical cut (incision) daily for increased redness, drainage, swelling, or separation of skin.   Clean your incision gently with soap and water every day, and then pat it dry. If your health care provider says it is okay, leave the incision uncovered. Use a bandage (dressing) if the incision is draining fluid or appears irritated. If the adhesive strips across the incision do not fall off within 7 days, carefully peel them off.   Hug a pillow when coughing or sneezing until your incision is healed. This helps to relieve pain.   Do not use tampons or douche until your health care provider says it is okay.   Shower, wash your hair, and take tub baths as directed by your health care provider.   Wear a well-fitting bra that provides breast support.   Limit wearing support panties or control-top hose.   Drink enough fluids to keep your urine clear or pale yellow.   Eat high-fiber foods such as whole grain cereals and breads, brown rice, beans, and fresh fruits and vegetables every day. These foods may help prevent or relieve constipation.   Resume activities such as climbing stairs,  driving, lifting, exercising, or traveling as directed by your health care provider.   Talk to your health care provider about resuming sexual activities. This is dependent upon your risk of infection, your rate of healing, and your comfort and desire to resume sexual activity.   Try to have someone help you with your household activities and your newborn for at least a few days after you leave the hospital.   Rest as much as possible. Try to rest or take a nap when your newborn is sleeping.   Increase your activities gradually.   Keep all of your scheduled postpartum appointments. It is very important to keep your scheduled follow-up appointments. At these appointments, your health care provider will be checking to make sure that you are healing physically and emotionally.  SEEK MEDICAL CARE IF:    You are passing large clots from your vagina. Save any clots to show your health care provider.   You have a foul smelling discharge from your vagina.   You have trouble urinating.   You are urinating frequently.   You have pain when you urinate.   You have a change in your bowel movements.   You have increasing redness, pain, or swelling near your incision.   You have pus draining from your incision.   Your incision is separating.   You have painful, hard, or reddened breasts.   You have a severe headache.   You have blurred vision or see spots.   You feel sad   or depressed.   You have thoughts of hurting yourself or your newborn.   You have questions about your care, the care of your newborn, or medications.   You are dizzy or light-headed.   You have a rash.   You have pain, redness, or swelling at the site of the removed intravenous access (IV) tube.   You have nausea or vomiting.   You stopped breastfeeding and have not had a menstrual period within 12 weeks of stopping.   You are not breastfeeding and have not had a menstrual period within 12 weeks of delivery.   You have a fever.  SEEK  IMMEDIATE MEDICAL CARE IF:   You have persistent pain.   You have chest pain.   You have shortness of breath.   You faint.   You have leg pain.   You have stomach pain.   Your vaginal bleeding saturates 2 or more sanitary pads in 1 hour.  MAKE SURE YOU:    Understand these instructions.   Will watch your condition.   Will get help right away if you are not doing well or get worse.     This information is not intended to replace advice given to you by your health care provider. Make sure you discuss any questions you have with your health care provider.     Document Released: 11/21/2001 Document Revised: 03/22/2014 Document Reviewed: 10/27/2011  Elsevier Interactive Patient Education 2016 [employer]

## 2015-08-07 NOTE — Lactation Note (Signed)
This note was copied from a baby's chart. Lactation Consultation Note  Patient Name: Boy Yashica Resko [id number] Date: 08/07/2015 Reason for consult: Follow-up assessment;Infant weight loss   Follow up with mom of 74 hour old infant. Infant weight 7 lb 6.7 oz with 8% weight loss since birth. Infant with 6 BF for 10-40 minutes, 2 attempts, 5 voids and 3 stools in last 24 hours. This is moms first BF baby. LATCH Scores 8-9 by bedside RN.  Mom reports nipple pain with initial latch that improves, she is using EBM to nipples.  Mom reports she is feeling a little fuller today, and colostrum is more easily expressible. Mom reports she is hearing more swallows.   Reviewed all BF information in Taking Care of Baby and Me Booklet. Reviewed engorgement prevention/treatment with mom. Reviewed I/O and enc mom to keep feeding log and take to Ped appt.   Mom has a free style pump at home for use. Infant was sitting in dad's lap sucking on Pacifier, enc mom to allow infant to go to breast to meet sucking needs to encourage milk to come in. Mom voiced that infant will be going to [facility] and may not be seen until Tuesday, advised mom it would be best if infant is seen tomorrow if d/c home today.   Left my # for mom to call for next feeding for assessment.      Maternal Data Formula Feeding for Exclusion: No Does the patient have breastfeeding experience prior to this delivery?: No  Feeding    LATCH Score/Interventions                      Lactation Tools Discussed/Used WIC Program: No Pump Review: Milk Storage   Consult Status Consult Status: Follow-up Date: 08/07/15 Follow-up type: In-patient    Debby Freiberg Myeasha Ballowe 08/07/2015, 9:21 AM

## 2015-08-07 NOTE — Lactation Note (Signed)
This note was copied from a baby's chart. Lactation Consultation Note  Patient Name: Ann Brock [id number] Date: 08/07/2015 Reason for consult: Follow-up assessment;Infant weight loss   Follow up with mom, mom called me for feeding assessment. Mom had infant latched to left breast in cradle hold when I went into the room. Infant was suckling rhythmically and intermittent swallows noted. Mom had infant pulled in deeply and was giving breathing space. Assisted mom with alignment and positioning infant while not giving breathing space.   Mom with large soft breast with short shafted everted nipples. Infant came off and burped. He then was cuing to feed, mom latched infant to right breast using cross cradle hold and did well with positioning without breathing space. Enc mom to massage breasts with feeding. Infant was noted to have rhythmic suckling and intermittent swallows were noted. Reviewed awakening techniques with mom and importance of nutritive suckling while at breast. Infant still feeding when I left the room   Reviewed [place], mom aware of OP services BF support groups and [place] phone #. Infant with f/u ped appt tomorrow.    Maternal Data Formula Feeding for Exclusion: No Does the patient have breastfeeding experience prior to this delivery?: No  Feeding Feeding Type: Breast Fed Length of feed: 15 min (Still feeding when I left the room)  LATCH Score/Interventions Latch: Grasps breast easily, tongue down, lips flanged, rhythmical sucking. Intervention(s): Adjust position;Assist with latch;Breast massage;Breast compression  Audible Swallowing: Spontaneous and intermittent Intervention(s): Alternate breast massage  Type of Nipple: Everted at rest and after stimulation  Comfort (Breast/Nipple): Soft / non-tender     Hold (Positioning): Assistance needed to correctly position infant at breast and maintain latch.  LATCH Score: 9  Lactation Tools Discussed/Used WIC  Program: No Pump Review: Milk Storage   Consult Status Consult Status: Complete Date: 08/07/15 Follow-up type: Call as needed    Debby Freiberg Marce Schartz 08/07/2015, 10:45 AM

## 2015-08-07 NOTE — Discharge Summary (Signed)
Obstetric Discharge Summary Reason for Admission: cesarean section Prenatal Procedures: none Intrapartum Procedures: cesarean: low cervical, transverse Postpartum Procedures: none Complications-Operative and Postpartum: none  Please refer to operative note from 08/05/15.  Hospital Course:  Principal Problem:   History of cesarean delivery, currently pregnant Active Problems:   Chronic hypertension in pregnancy   Breech presentation, antepartum   Status post repeat low transverse cesarean section   Ann Brock is a 30 y.o. [id number] s/p elective repeat c-section. History of chronic HTN noted. She has postpartum course that was uncomplicated including no problems with ambulating, PO intake, urination, pain, or bleeding. The pt feels ready to go home and  will be discharged with outpatient follow-up.   Today: No acute events overnight.  Pt denies problems with ambulating, voiding or po intake.  She denies nausea or vomiting.  Pain is well controlled.  She has had flatus. She has not had bowel movement.  Lochia Minimal.  Plan for birth control is  Nexplanon.  Method of Feeding: Breast  Physical Exam:  General: alert, cooperative and no distress Lochia: appropriate Uterine Fundus: firm Incision: healing well DVT Evaluation: No evidence of DVT seen on physical exam.  H/H: Lab Results  Component Value Date/Time   HGB 11.0* 08/06/2015 06:03 AM   HCT 34.4* 08/06/2015 06:03 AM   LE Dopplers: Obtained 5/24 due to lower extremity pain. Negative for DVT.  Discharge Diagnoses: Term Pregnancy-delivered  Discharge Information: Date: 08/07/2015 Activity: pelvic rest Diet: routine  Medications: Percocet Breast feeding:  Yes Condition: stable Instructions: refer to handout Discharge to: home     Medication List    TAKE these medications        acetaminophen 325 MG tablet  Commonly known as:  TYLENOL  Take 650 mg by mouth every 6 (six) hours as needed for headache.     metoCLOPramide 10 MG tablet  Commonly known as:  REGLAN  TAKE ONE TABLET BY MOUTH THREE TIMES DAILY WITH MEALS     Misc. Devices Misc  Disp 1 hospital grade breast pump to use.  [facility] 08/05/15     Breast Pump Misc  Dispense one breast pump for patient     ondansetron 4 MG tablet  Commonly known as:  ZOFRAN  TAKE ONE TABLET BY MOUTH ONCE DAILY AS NEEDED FOR NAUSEA OR VOMITING     oxyCODONE 5 MG immediate release tablet  Commonly known as:  Oxy IR/ROXICODONE  Take 1-2 tablets (5-10 mg total) by mouth every 6 (six) hours as needed (pain scale 4-7).     pantoprazole 20 MG tablet  Commonly known as:  PROTONIX  Take 1 tablet (20 mg total) by mouth daily.     senna-docusate 8.6-50 MG tablet  Commonly known as:  Senokot-S  Take 2 tablets by mouth daily.     T.E.D. THIGH LENGTH/L-REGULAR Misc  1 Device by Does not apply route daily.       Follow-up Information    Follow up with [facility] [place]. Schedule an appointment as soon as possible for a visit in 6 weeks.   Specialty:  Obstetrics and Gynecology   Contact information:   [place], Ann Brock, [place] 08/07/2015,8:56 AM   CNM attestation I have seen and examined this patient and agree with above documentation in the resident's note.   Ann Brock is a 30 y.o. [id number] s/p rLTCS.   Pain is well controlled.  Plan  for birth control is undecided.  Method of Feeding: breast  PE:  BP 126/75 mmHg  Pulse 64  Temp(Src) 97.8 F (36.6 C) (Oral)  Resp 18  SpO2 100%  LMP 11/05/2014 (Exact Date)  Breastfeeding? Unknown Fundus firm  No results for input(s): HGB, HCT in the last 72 hours.   Plan: discharge today - postpartum care discussed - f/u clinic in 6 weeks for postpartum visit   Ann Brock, CNM 12:27 AM

## 2015-08-08 LAB — TYPE AND SCREEN
ABO/RH(D): A POS
ANTIBODY SCREEN: NEGATIVE
UNIT DIVISION: 0
Unit division: 0

## 2015-08-13 NOTE — Progress Notes (Signed)
Subjective:  Ann Brock is a 30 y.o. [phone] at [redacted]w[redacted]d being seen today for ongoing prenatal care.  She is currently monitored for the following issues for this high-risk pregnancy and has Neck pain; Elevated blood pressure; History of ectopic pregnancy; Supervision of high risk pregnancy, antepartum; Chronic hypertension in pregnancy; History of cesarean delivery, currently pregnant; Acid reflux; Breech presentation, antepartum; and Status post repeat low transverse cesarean section on her problem list.  Patient reports no complaints.  Contractions: Irritability. Vag. Bleeding: None.  Movement: Present. Denies leaking of fluid.   The following portions of the patient's history were reviewed and updated as appropriate: allergies, current medications, past family history, past medical history, past social history, past surgical history and problem list. Problem list updated.  Objective:   Filed Vitals:   08/04/15 1418  BP: 128/73  Pulse: 88  Weight: 254 lb (115.214 kg)    Fetal Status:     Movement: Present     General:  Alert, oriented and cooperative. Patient is in no acute distress.  Skin: Skin is warm and dry. No rash noted.   Cardiovascular: Normal heart rate noted  Respiratory: Normal respiratory effort, no problems with respiration noted  Abdomen: Soft, gravid, appropriate for gestational age. Pain/Pressure: Present     Pelvic: Vag. Bleeding: None Vag D/C Character: Thick   Cervical exam deferred        Extremities: Normal range of motion.  Edema: Trace  Mental Status: Normal mood and affect. Normal behavior. Normal judgment and thought content.   Urinalysis: Urine Protein: Negative Urine Glucose: Negative  Assessment and Plan:  Pregnancy: [id number] at [redacted]w[redacted]d  1. Supervision of high risk pregnancy, antepartum, third trimester   2. History of cesarean delivery, currently pregnant   3. Chronic hypertension in pregnancy, third trimester   4. Breech presentation,  antepartum, not applicable or unspecified fetus - C/S tomorrow  Term labor symptoms and general obstetric precautions including but not limited to vaginal bleeding, contractions, leaking of fluid and fetal movement were reviewed in detail with the patient. Please refer to After Visit Summary for other counseling recommendations.  Return in about 6 weeks (around 09/15/2015) for postpartum visit.   Emily Filbert, MD

## 2015-09-19 ENCOUNTER — Ambulatory Visit: Payer: BLUE CROSS/BLUE SHIELD | Admitting: Family

## 2015-09-24 ENCOUNTER — Telehealth (HOSPITAL_COMMUNITY): Payer: Self-pay | Admitting: Lactation Services

## 2015-09-24 NOTE — Telephone Encounter (Signed)
Mom called with concerns about breastfeeding. Baby is not gaining weight well. Ped told to supplement. Came to [facility] on Monday and suggestions were made to pump more, taking Fenugreek and frequent nursing. OP appointment made for Thursday 10/02/2015 at 1 pm. Suggested [facility] again for another weight check and advice. No questions at present. To call and see if we have a cancellation and can get earlier appointment.

## 2015-09-25 ENCOUNTER — Encounter: Payer: Self-pay | Admitting: *Deleted

## 2015-09-25 ENCOUNTER — Encounter: Payer: Self-pay | Admitting: Obstetrics & Gynecology

## 2015-09-25 ENCOUNTER — Other Ambulatory Visit (HOSPITAL_COMMUNITY): Payer: Self-pay | Admitting: Obstetrics & Gynecology

## 2015-09-25 ENCOUNTER — Ambulatory Visit (INDEPENDENT_AMBULATORY_CARE_PROVIDER_SITE_OTHER): Payer: BLUE CROSS/BLUE SHIELD | Admitting: Obstetrics & Gynecology

## 2015-09-25 DIAGNOSIS — Z01812 Encounter for preprocedural laboratory examination: Secondary | ICD-10-CM

## 2015-09-25 DIAGNOSIS — Z30430 Encounter for insertion of intrauterine contraceptive device: Secondary | ICD-10-CM

## 2015-09-25 DIAGNOSIS — L680 Hirsutism: Secondary | ICD-10-CM

## 2015-09-25 LAB — POCT URINE PREGNANCY: PREG TEST UR: NEGATIVE

## 2015-09-25 MED ORDER — METOCLOPRAMIDE HCL 5 MG/5ML PO SOLN: 10.0000 mg | Freq: Three times a day (TID) | ORAL | Status: DC

## 2015-09-25 MED ORDER — LEVONORGESTREL 20 MCG/24HR IU IUD
INTRAUTERINE_SYSTEM | Freq: Once | INTRAUTERINE | Status: AC
  Administered 2015-09-25: 17:00:00 via INTRAUTERINE

## 2015-09-25 NOTE — Addendum Note (Signed)
Addended by: Asencion Islam on: 09/25/2015 04:46 PM   Modules accepted: Orders

## 2015-09-25 NOTE — Progress Notes (Signed)
  Subjective:     Ann Brock is a 30 y.o. P3 (all sons)  female who presents for a postpartum visit. She is 7 weeks postpartum following a low cervical transverse Cesarean section. I have fully reviewed the prenatal and intrapartum course. The delivery was at 68 gestational weeks. Outcome: repeat cesarean section, low transverse incision. Anesthesia: spinal. Postpartum course has been normal. Baby's course has been ok, but he is not gaining weight as expected. Baby is feeding by both breast and bottle - Carnation Good Start Soy. Bleeding no bleeding. Bowel function is normal. Bladder function is normal. Patient is sexually active. Contraception method is condoms. Postpartum depression screening: negative.  The following portions of the patient's history were reviewed and updated as appropriate: allergies, current medications, past family history, past medical history, past social history, past surgical history and problem list.  Review of Systems Pertinent items are noted in HPI.   Objective:    BP 138/80 mmHg  Pulse 81  Resp 16  Ht 5\' 6"  (1.676 m)  Wt 249 lb (112.946 kg)  BMI 40.21 kg/m2  Breastfeeding? Yes  General:  alert   Breasts:  inspection negative, no nipple discharge or bleeding, no masses or nodularity palpable  Lungs: clear to auscultation bilaterally  Heart:  regular rate and rhythm, S1, S2 normal, no murmur, click, rub or gallop  Abdomen: soft, non-tender; bowel sounds normal; no masses,  no organomegaly   Vulva:  normal  Vagina: normal vagina  Cervix:  anteverted  Corpus: normal size, contour, position, consistency, mobility, non-tender  Adnexa:  normal adnexa  Rectal Exam: Not performed.    UPT negative, consent signed, Time out procedure done. Cervix prepped with betadine and grasped with a single tooth tenaculum. Mirena was easily placed and the strings were cut to 3-4 cm. Uterus sounded to 9 cm. She tolerated the procedure well.     Assessment:    Normal postpartum exam. Pap smear not done at today's visit.   Plan:    1. Contraception: IUD 2. Reglan to help with milk production (she is already on phengreek) 3. Back up method for 2 weeks 4 Complaint of hirsuitism of chin- check test panel and rec weight loss 3. Follow up in: 1 year or as needed.

## 2015-09-25 NOTE — Addendum Note (Signed)
Addended by: Asencion Islam on: 09/25/2015 04:48 PM   Modules accepted: Orders

## 2015-09-29 LAB — TESTOS,TOTAL,FREE AND SHBG (FEMALE)
Sex Hormone Binding Glob.: 32 nmol/L (ref 17–124)
Testosterone, Free: 2.7 pg/mL (ref 0.1–6.4)
Testosterone,Total,LC/MS/MS: 19 ng/dL (ref 2–45)

## 2015-09-30 ENCOUNTER — Telehealth: Payer: Self-pay | Admitting: *Deleted

## 2015-09-30 NOTE — Telephone Encounter (Signed)
Letter to return back to work signed and pt to receive through My-Chart

## 2015-10-02 ENCOUNTER — Ambulatory Visit (HOSPITAL_COMMUNITY)
Admission: RE | Admit: 2015-10-02 | Discharge: 2015-10-02 | Disposition: A | Discharge status: Home / Self Care | Payer: BLUE CROSS/BLUE SHIELD | Source: Ambulatory Visit | Attending: Family Medicine | Admitting: Family Medicine

## 2015-10-02 NOTE — Lactation Note (Signed)
Lactation Consult  Mother's reason for visit:  268 week old with slow weight gain  Visit Type: Outpatient Appointment Notes:  See below Consult:  Initial Lactation Consultant:  Judee ClaraSmith, Donnivan Villena E _______________________________________________________________________ Ann FloresBaby's Name: Ann BrackenNoah James Xavier Brock Date of Birth: [birth year]05/2013 [facility], Dr. Liliane ChannelBunemann Gender: female Gestational Age: 5730w0d (At Birth) Birth Weight: 8 lb 1.5 oz (3670 g) Weight at Discharge: Weight: 7 lb 6.7 oz (3365 g)Date of Discharge: 08/07/2015 Filed Weights   08/05/15 1004 08/06/15 0057 08/06/15 2340  Weight: 8 lb 1.5 oz (3670 g) 7 lb 12.9 oz (3540 g) 7 lb 6.7 oz (3365 g)  Discharge weight 7 lbs 4 oz 08/07/15 WIC appt weight 7 lbs 4 oz 1 week later (08/14/15) Last weight taken from location [facility]: 8 lbs 9 oz 09/25/15 @ Peds office  Weight today: 8 lbs 9 oz  ________________________________________________________________________ Mother's Name: Ann LeaderStephanie Brock Type of delivery:  repeat Cesarean Section Breastfeeding Experience:  Poor with first 2 children Maternal Medical Conditions:  Polycystic ovarian syndrome (probable per MD) Maternal Medications:  Mirena IUD _______________________________________________________________________ Breastfeeding History (Post Discharge) Frequency of breastfeeding:  8-10 times a day Duration of feeding:  20-30 minutes Supplementation Formula:  Volume 2-4 oz Frequency:  Every 3-4 hrs Total volume per day:  16-21 oz       Brand: Enfamil Breastmilk:  Volume 15-30 ml Frequency:  Every 3-4 hrs Total volume per day:2-4 oz Method:  Bottle,  Pumping Type of pump:  Medela pump in style Frequency:  Every 3-4 hrs Volume: 15-30 ml Infant Intake and Output Assessment Voids: 12 in 24 hrs.  Color:  Clear yellow Stools:  1-2 in 24 hrs.  Color:   Yellow _______________________________________________________________________ Maternal Breast Assessment Breast:  Soft Nipple:  Erect Pain level:  0 _______________________________________________________________________ Feeding Assessment/Evaluation Initial feeding assessment: Infant's oral assessment:  Variance  Baby had a high palate.  On digital exam, baby unable to maintain a good seal on finger.  Swipe under tongue revealed a restrictive posterior labial frenulum.  Baby cups tongue but unable to lift tongue very high.  When baby fed with a bottle, he was unable to maintain seal, and dribbled milk the entire time.  Clicking noise heard throughout bottle feeding, relating to difficultly in making a seal.  Baby is also on treatment for reflux.     Positioning:  Cradle Left breast LATCH documentation:  Latch:  2 = Grasps breast easily, tongue down, lips flanged, rhythmical sucking.  Audible swallowing:  1 = A few with stimulation  Type of nipple:  2 = Everted at rest and after stimulation  Comfort (Breast/Nipple):  2 = Soft / non-tender  Hold (Positioning):  2 = No assistance needed to correctly position infant at breast  LATCH score:  9 Attached assessment:  Deep  Lips flanged:  No.  Lips untucked:  Yes.   Suck assessment:  Displays both nutritive and non nutritive sucking Pre-feed weight:  3884 g   Post-feed weight:  3894g  Amount transferred: 10 ml  (20 minutes total on both breasts) Amount supplemented:  4 oz Enfamil (took supplement very quickly)   Total amount transferred:  10 ml Total supplement given:  120ml formula   Assessment- 618 week old Infant appears alert, active, and well hydrated, though noticeably lean with little body fat.  Baby 8 oz above birth weight.  Mom has been supplementing with 4 oz of formula every 3-4 hrs during last week, without noted weight gain today.  Baby  noted to have a fair suck, but unable to maintain a seal on finger, or bottle nipple.  On  breast, he latches deeply, with lips tucked in, and pops on and off the breast, minimal swallowing heard.  Mom has soft, full breasts, with compressible areola, no trauma noted on nipples or areola.  Post 20 min feeding, baby transferred 10 ml.   Baby noted to have a probable restricted posterior frenulum as baby cups tongue but unable to elevate in mouth to create suctioin.  Baby tongue can extend just to lower lip.  Palate high.  Baby clicking and drooling on bottle (weak seal on bottle).  Baby on treatment for reflux. Mom taking Reglan 10 mg 3 times a day for last 6 days. [place] office called and LM about need for hospital grade DEBP for Mom to use.    Plan of care- 1-  Bottle feed Ann 4 oz increasing amount as tolerated every 3 hrs.  (Breast milk used first) 2- Pump both breasts 15-20 minutes both breasts (goal is 8-12 times per 24 hrs) 3- Use breast feeding as dessert and appetizer, but not to count as meal until milk supply increases 4- Tongue Tie resource handout given 5- Obtain a [employer] DEBP pump- Called today, but Mom to call tomorrow if not heard from them. 6- Breast Feeding Support Group Tuesday July 25th at 11 am 7- Follow up with Lactation on Thursday, July 27th @ 1pm. 8- Call for any questions prior to appointment

## 2015-10-09 ENCOUNTER — Ambulatory Visit (HOSPITAL_COMMUNITY)
Admission: RE | Admit: 2015-10-09 | Discharge: 2015-10-09 | Disposition: A | Discharge status: Home / Self Care | Payer: BLUE CROSS/BLUE SHIELD | Source: Ambulatory Visit | Attending: Family Medicine | Admitting: Family Medicine

## 2015-10-09 ENCOUNTER — Telehealth: Payer: Self-pay | Admitting: *Deleted

## 2015-10-09 NOTE — Lactation Note (Signed)
Lactation Consult  Mother's reason for visit: mother has low milk supply Visit Type:  Feeding assessment  Appointment Notes: Mother states that she is breastfeeding infant for a few mins then she gives a bottle with 5 ounces and then goes back to breast at the end of the bottle feeding. Consult:  Follow-Up Lactation Consultant:  Darla Lesches  ________________________________________________________________________    ________________________________________________________________________  Mother's Name: Ann Brock Type of delivery:  C-Section Breastfeeding Experience:  Unsuccessful with 2 other children Maternal Medical Conditions:  Polycystic ovarian syndrome Maternal Medications:  Prenatal vits, reglan 10 tid  ________________________________________________________________________  Breastfeeding History (Post Discharge)  Frequency of breastfeeding: every 3-4 hours Duration of feeding: on and off  Supplementation  Formula:  Volume4-5 ounces ml Frequency:  Every 3-4 hours        Brand: Enfamil  Breastmilk:  Volume 15-45 ml  every 2-3 hours  Method:  Bottle,   Pumping  Type of pump:  Symphony Frequency:  Every 2-3 hours Volume:  15-45 ml    Infant Intake and Output Assessment  Voids:  10 in 24 hrs.  Color:  Clear yellow Stools:  2 in 24 hrs.  Color:  Yellow  ________________________________________________________________________  Maternal Breast Assessment  Breast:  Soft Nipple:  Erect Pain level:  0 Pain interventions:  Bra  _______________________________________________________________________ Feeding Assessment/Evaluation: Ann Brock did gain weight from last visit. Mothers breast are still very soft and she states that she is pumping about the same. I advised mother to bottle feed and focus on pumping abd building a good milk supply.    Assist mother with latching infant on in cross cradle hold. Infant bounces on and off breast repeatedly  without a sustained latch.  Mother was fit with a #20 nipple shield   infant latch and sustained latch for 5-10 min. No milk transfer from feeding Attempt to use a SNS. Infant refused the breast with the SNS.  Mother bottle fed Ann Brock with 5 ounces of formula.   Infant's oral assessment:  Variance  Positioning:  Cross cradle Right breast  LATCH documentation:  Latch:  2 = Grasps breast easily, tongue down, lips flanged, rhythmical sucking.  Audible swallowing:  2 = Spontaneous and intermittent  Type of nipple:  2 = Everted at rest and after stimulation  Comfort (Breast/Nipple):  2 = Soft / non-tender  Hold (Positioning):  1 = Assistance needed to correctly position infant at breast and maintain latch  LATCH score:  9  Attached assessment:  Deep  Lips flanged:  Yes.    Lips untucked:  Yes.    Suck assessment:  Displays both  Tools:  Nipple shield 20 mm Instructed on use and cleaning of tool:  Yes.    Pre-feed weight: 9-5.5,4240 Post-feed weight: 9-5.3,4234 Amount transferred: infant lost weight during the feeding.    Total amount transferred:  None infant lost weight  Total supplement given: 5 ounces of formula Continue to offer breast for 10-15 mins and then supplement infant with 3-5 ounces every 2-3 hours Mother to continue to post pump at least 6-8 times.  Continue reglan and consider Domperidone after talking with MD Mother to follow up on 7/31 to see Peds and follow up with LC PRN

## 2015-10-09 NOTE — Telephone Encounter (Signed)
LM on voicemail of normal labs.  Pt had not been notified earlier because she is on my-chart and has access.

## 2016-02-03 ENCOUNTER — Ambulatory Visit (INDEPENDENT_AMBULATORY_CARE_PROVIDER_SITE_OTHER): Payer: BLUE CROSS/BLUE SHIELD | Admitting: Family Medicine

## 2016-02-03 ENCOUNTER — Encounter: Payer: Self-pay | Admitting: Family Medicine

## 2016-02-03 VITALS — BP 143/88 | HR 116 | Temp 98.9°F | Wt 268.0 lb

## 2016-02-03 DIAGNOSIS — I951 Orthostatic hypotension: Secondary | ICD-10-CM

## 2016-02-03 DIAGNOSIS — A09 Infectious gastroenteritis and colitis, unspecified: Secondary | ICD-10-CM | POA: Diagnosis not present

## 2016-02-03 NOTE — Progress Notes (Signed)
Ann Brock is a 30 y.o. female who presents to [facility] today for bloody vomiting and diarrhea. Patient has a 2 day history of abdominal pain and vomiting and diarrhea with blood. She notes fever.  Both her husband and her child her sick with a similar illness. She notes symptoms started after she ate at [employer]. She has tried some leftover Reglan which did not help. She has tried ibuprofen and Tylenol which helps as well a little. She feels lightheaded and dizzy.   Past Medical History:  Diagnosis Date  . Chest pain    2014  . Ectopic pregnancy    treated with methotrexate  . Hyperemesis gravidarum   . Hypertension    prior to preg, no meds  . Infection    UTI  . Vaginal Pap smear, abnormal    repeat was normal   Past Surgical History:  Procedure Laterality Date  . CESAREAN SECTION    . CESAREAN SECTION N/A 08/05/2015   Procedure: REPEAT CESAREAN SECTION ;  Surgeon: Donnamae Jude, MD;  Location: [place];  Service: Obstetrics;  Laterality: N/A;  . CHOLECYSTECTOMY    . KNEE SURGERY    . TONSILLECTOMY AND ADENOIDECTOMY     Social History  Substance Use Topics  . Smoking status: Former Research scientist (life sciences)  . Smokeless tobacco: Never Used     Comment: quit  2006  . Alcohol use No     Comment: socially prior to pregnancy   family history includes Alcohol abuse in her paternal grandfather and paternal grandmother; Birth defects in her brother; Cancer in her maternal grandfather, maternal grandmother, paternal grandfather, and paternal grandmother; Cerebral palsy in her sister; Depression in her mother; Diabetes in her maternal grandmother; Drug abuse in her father; Heart disease in her father; Hyperlipidemia in her father; Hypertension in her father and mother; Kidney disease in her sister; Stroke in her father, maternal grandfather, maternal  grandmother, paternal grandfather, and paternal grandmother.  ROS as above:  Medications: Current Outpatient Prescriptions  Medication Sig Dispense Refill  . acetaminophen (TYLENOL) 325 MG tablet Take 650 mg by mouth every 6 (six) hours as needed for headache.     Marland Kitchen BREWERS YEAST PO Take by mouth.    Marland Kitchen FENUGREEK PO Take by mouth.    . metoCLOPramide (REGLAN) 5 MG/5ML solution Take 10 mLs (10 mg total) by mouth 4 (four) times daily -  before meals and at bedtime. 120 mL 0  . Misc. Devices MISC Disp 1 hospital grade breast pump to use.  EDC 08/05/15 1 each 0  . Prenatal Vit-Fe Fumarate-FA (MULTIVITAMIN-PRENATAL) 27-0.8 MG TABS tablet Take 1 tablet by mouth daily at 12 noon.     No current facility-administered medications for this visit.    Allergies  Allergen Reactions  . Aspirin Anaphylaxis  . Cephalosporins Anaphylaxis  . Contrast Media  [Iodinated Diagnostic Agents] Anaphylaxis    Health Maintenance Health Maintenance  Topic Date Due  . HEMOGLOBIN A1C  10/07/1987  . PNEUMOCOCCAL POLYSACCHARIDE VACCINE (1) 05/28/1989  . FOOT EXAM  05/28/1997  . OPHTHALMOLOGY EXAM  05/28/1997  . URINE MICROALBUMIN  05/28/1997  . INFLUENZA VACCINE  10/14/2015  . PAP SMEAR  01/02/2018  . TETANUS/TDAP  11/13/2024  . HIV Screening  Completed     Exam:  BP (!) 143/88   Pulse (!) 116   Temp 98.9 F (37.2 C) (Oral)   Wt 268 lb (121.6 kg)  SpO2 100%   BMI 43.26 kg/m   No data found.   Gen: Ill appearing HEENT: EOMI,  MMM Lungs: Normal work of breathing. CTABL Heart: Tachycardia no MRG Abd: NABS, Soft. Nondistended, mildly tender with no rebound or guarding Exts: Brisk capillary refill, warm and well perfused.    No results found for this or any previous visit (from the past 72 hour(s)). No results found.    Assessment and Plan: 30 y.o. female with vomiting and diarrhea concern for bacterial food poisoning like Shigella or salmonella. Patient is significantly orthostatic today  and symptomatic. Additionally she has abdominal pain and is tender to palpation without rebound or guarding. Plan for transfer to the emergency department for further evaluation and management.   No orders of the defined types were placed in this encounter.   Discussed warning signs or symptoms. Please see discharge instructions. Patient expresses understanding.

## 2016-02-09 ENCOUNTER — Ambulatory Visit: Payer: BLUE CROSS/BLUE SHIELD | Admitting: Family Medicine

## 2016-02-11 ENCOUNTER — Encounter: Payer: Self-pay | Admitting: Family Medicine

## 2016-02-11 ENCOUNTER — Ambulatory Visit (INDEPENDENT_AMBULATORY_CARE_PROVIDER_SITE_OTHER): Payer: BLUE CROSS/BLUE SHIELD | Admitting: Family Medicine

## 2016-02-11 ENCOUNTER — Telehealth: Payer: Self-pay

## 2016-02-11 VITALS — BP 131/84 | HR 59 | Wt 267.0 lb

## 2016-02-11 DIAGNOSIS — F329 Major depressive disorder, single episode, unspecified: Secondary | ICD-10-CM | POA: Insufficient documentation

## 2016-02-11 DIAGNOSIS — F411 Generalized anxiety disorder: Secondary | ICD-10-CM | POA: Insufficient documentation

## 2016-02-11 DIAGNOSIS — K529 Noninfective gastroenteritis and colitis, unspecified: Secondary | ICD-10-CM | POA: Diagnosis not present

## 2016-02-11 DIAGNOSIS — L680 Hirsutism: Secondary | ICD-10-CM | POA: Insufficient documentation

## 2016-02-11 DIAGNOSIS — F332 Major depressive disorder, recurrent severe without psychotic features: Secondary | ICD-10-CM | POA: Diagnosis not present

## 2016-02-11 MED ORDER — SERTRALINE HCL 25 MG PO TABS: ORAL_TABLET | ORAL | 2 refills | Status: DC

## 2016-02-11 MED ORDER — TRAZODONE HCL 50 MG PO TABS: 25.0000 mg | ORAL_TABLET | Freq: Every evening | ORAL | 3 refills | Status: DC | PRN

## 2016-02-11 NOTE — Patient Instructions (Signed)
Thank you for coming in today. Get fasting labs in the morning soon.  Start taking Zoloft. Increase to 2 pills by mouth daily in 1 week.  Use trazodone at night as needed for sleep.  Return in 2 weeks.  You should hear from therapy soon.    Major Depressive Disorder, Adult Major depressive disorder (MDD) is a mental health condition. MDD often makes you feel sad, hopeless, or helpless. MDD can also cause symptoms in your body. MDD can affect your:  Work.  School.  Relationships.  Other normal activities. MDD can range from mild to very bad. It may occur once (single episode MDD). It can also occur many times (recurrent MDD). The main symptoms of MDD often include:  Feeling sad, depressed, or irritable most of the time.  Loss of interest. MDD symptoms also include:  Sleeping too much or too little.  Eating too much or too little.  A change in your weight.  Feeling tired (fatigue) or having low energy.  Feeling worthless.  Feeling guilty.  Trouble making decisions.  Trouble thinking clearly.  Thoughts of suicide or harming others.  Feeling weak.  Feeling agitated.  Keeping yourself from being around other people (isolation). Follow these instructions at home: Activity  Do these things as told by your doctor:  Go back to your normal activities.  Exercise regularly.  Spend time outdoors. Alcohol  Talk with your doctor about how alcohol can affect your antidepressant medicines.  Do not drink alcohol. Or, limit how much alcohol you drink.  This means no more than 1 drink a day for nonpregnant women and 2 drinks a day for men. One drink equals one of these:  12 oz of beer.  5 oz of wine.  1 oz of hard liquor. General instructions  Take over-the-counter and prescription medicines only as told by your doctor.  Eat a healthy diet.  Get plenty of sleep.  Find activities that you enjoy. Make time to do them.  Think about joining a support group.  Your doctor may be able to suggest a group for you.  Keep all follow-up visits as told by your doctor. This is important. Where to find more information:  [employer] on Mental Illness:  www.nami.org  U.S. National Institute of Mental Health:  https://carter.com/  National Suicide Prevention Lifeline:  224-871-6193. This is free, 24-hour help. Contact a doctor if:  Your symptoms get worse.  You have new symptoms. Get help right away if:  You self-harm.  You see, hear, taste, smell, or feel things that are not present (hallucinate). If you ever feel like you may hurt yourself or others, or have thoughts about taking your own life, get help right away. You can go to your nearest emergency department or call:  Your local emergency services (911 in the [place]  A suicide crisis helpline, such as the [place]:  (260) 329-4333. This is open 24 hours a day. This information is not intended to replace advice given to you by your health care provider. Make sure you discuss any questions you have with your health care provider. Document Released: 02/10/2015 Document Revised: 11/16/2015 Document Reviewed: 11/16/2015 Elsevier Interactive Patient Education  2017 [employer].

## 2016-02-11 NOTE — Progress Notes (Signed)
Ann Brock is a 30 y.o. female who presents to [place]: [place] today for  Follow-up gastroenteritis and discuss hirsutism and depression/anxiety.  Gastroenteritis: Patient was last seen for vomiting and diarrhea. The stool had blood and patient was orthostatic. She was sent to the emergency room and responded very well to fluid resuscitation. She's feeling much better now with no fevers chills vomiting or diarrhea. She denies any abdominal pain. No stool culture was obtained.  Hirsutism: Patient is a history of hirsutism noted as hair on her chin worse in the last several months. She is about 7 months postpartum.  She had a testosterone test done in the afternoon nonfasting which was normal. She has is bothersome and interferes with her quality of life.  Anxiety/depression: Patient notes that last several months significant worsening anxiety and depression symptoms. She has a personal history of depression and anxiety as a child. She was on a medication for several years but has not been on any since. She notes her symptoms have worsened significantly and are interfering with her quality of life. She denies any SI or HI. (Please see PHQ9 and GAD 7 below)    Past Medical History:  Diagnosis Date  . Chest pain    2014  . Ectopic pregnancy    treated with methotrexate  . Hyperemesis gravidarum   . Hypertension    prior to preg, no meds  . Infection    UTI  . Vaginal Pap smear, abnormal    repeat was normal   Past Surgical History:  Procedure Laterality Date  . CESAREAN SECTION    . CESAREAN SECTION N/A 08/05/2015   Procedure: REPEAT CESAREAN SECTION ;  Surgeon: Donnamae Jude, MD;  Location: [place];  Service: Obstetrics;  Laterality: N/A;  . CHOLECYSTECTOMY    . KNEE SURGERY    . TONSILLECTOMY AND ADENOIDECTOMY     Social History  Substance Use  Topics  . Smoking status: Former Research scientist (life sciences)  . Smokeless tobacco: Never Used     Comment: quit  2006  . Alcohol use No     Comment: socially prior to pregnancy   family history includes Alcohol abuse in her paternal grandfather and paternal grandmother; Birth defects in her brother; Cancer in her maternal grandfather, maternal grandmother, paternal grandfather, and paternal grandmother; Cerebral palsy in her sister; Depression in her mother; Diabetes in her maternal grandmother; Drug abuse in her father; Heart disease in her father; Hyperlipidemia in her father; Hypertension in her father and mother; Kidney disease in her sister; Stroke in her father, maternal grandfather, maternal grandmother, paternal grandfather, and paternal grandmother.  ROS as above:  Medications: Current Outpatient Prescriptions  Medication Sig Dispense Refill  . acetaminophen (TYLENOL) 325 MG tablet Take 650 mg by mouth every 6 (six) hours as needed for headache.     Marland Kitchen BREWERS YEAST PO Take by mouth.    . metoCLOPramide (REGLAN) 5 MG/5ML solution Take 10 mLs (10 mg total) by mouth 4 (four) times daily -  before meals and at bedtime. 120 mL 0  . Misc. Devices MISC Disp 1 hospital grade breast pump to use.  EDC 08/05/15 1 each 0  . Prenatal Vit-Fe Fumarate-FA (MULTIVITAMIN-PRENATAL) 27-0.8 MG TABS tablet Take 1 tablet by mouth daily at 12 noon.    . sertraline (ZOLOFT) 25 MG tablet Take 1 pill po daily x 1 week. Then increase to 2 pills po daily. 30 tablet 2  .  traZODone (DESYREL) 50 MG tablet Take 0.5-1 tablets (25-50 mg total) by mouth at bedtime as needed for sleep. 30 tablet 3   No current facility-administered medications for this visit.    Allergies  Allergen Reactions  . Aspirin Anaphylaxis  . Cephalosporins Anaphylaxis  . Contrast Media  [Iodinated Diagnostic Agents] Anaphylaxis    Health Maintenance Health Maintenance  Topic Date Due  . HEMOGLOBIN A1C  1987/11/16  . PNEUMOCOCCAL POLYSACCHARIDE VACCINE (1)  05/28/1989  . FOOT EXAM  05/28/1997  . OPHTHALMOLOGY EXAM  05/28/1997  . URINE MICROALBUMIN  05/28/1997  . INFLUENZA VACCINE  10/14/2015  . PAP SMEAR  01/02/2018  . TETANUS/TDAP  11/13/2024  . HIV Screening  Completed     Exam:  BP 131/84   Pulse (!) 59   Wt 267 lb (121.1 kg)   BMI 43.09 kg/m  Gen: Well NAD HEENT: EOMI,  MMM Lungs: Normal work of breathing. CTABL Heart: RRR no MRG Abd: NABS, Soft. Nondistended, Nontender Exts: Brisk capillary refill, warm and well perfused.  Skin: Hirsutism present on chin Psych: Alert and oriented tearful affect. Normal thought process. No SI or HI expressed.  Depression screen [facility] 2/9 02/11/2016 07/10/2015  Decreased Interest 3 0  Down, Depressed, Hopeless 3 0  PHQ - 2 Score 6 0  Altered sleeping 3 -  Tired, decreased energy 3 -  Change in appetite 3 -  Feeling bad or failure about yourself  3 -  Trouble concentrating 3 -  Moving slowly or fidgety/restless 3 -  Suicidal thoughts 0 -  PHQ-9 Score 24 -  Difficult doing work/chores Extremely dIfficult -   GAD 7 : Generalized Anxiety Score 02/11/2016  Nervous, Anxious, on Edge 3  Control/stop worrying 3  Worry too much - different things 3  Trouble relaxing 3  Restless 3  Easily annoyed or irritable 3  Afraid - awful might happen 3  Total GAD 7 Score 21  Anxiety Difficulty Extremely difficult       No results found for this or any previous visit (from the past 72 hour(s)). No results found.    Assessment and Plan: 30 y.o. female with  Hirsutism: Unclear etiology. Suspect PCOS or similar. Patient is obese. Plan for endocrine workup listed below. Recheck in the near future.  Anxiety and depression: Quite profound currently. Plan to start sertraline and trazodone. Recheck in 2 weeks. Refer for counseling.   Resolved gastroenteritis: Patient is doing well. Plan for watchful waiting. I doubt there is any need for further workup at this time. This seemed to be infectious and  has since resolved.  Orders Placed This Encounter  Procedures  . TSH  . Testosterone  . Estrogens, total  . Progesterone  . Follicle stimulating hormone  . LH    Discussed warning signs or symptoms. Please see discharge instructions. Patient expresses understanding.

## 2016-02-12 ENCOUNTER — Other Ambulatory Visit: Payer: Self-pay

## 2016-02-12 DIAGNOSIS — F332 Major depressive disorder, recurrent severe without psychotic features: Secondary | ICD-10-CM

## 2016-02-12 DIAGNOSIS — F411 Generalized anxiety disorder: Secondary | ICD-10-CM

## 2016-02-12 MED ORDER — SERTRALINE HCL 25 MG PO TABS: ORAL_TABLET | ORAL | 2 refills | Status: DC

## 2016-02-12 MED ORDER — TRAZODONE HCL 50 MG PO TABS: 25.0000 mg | ORAL_TABLET | Freq: Every evening | ORAL | 3 refills | Status: DC | PRN

## 2016-02-23 ENCOUNTER — Ambulatory Visit: Payer: BLUE CROSS/BLUE SHIELD | Admitting: Family Medicine

## 2016-05-19 ENCOUNTER — Ambulatory Visit (INDEPENDENT_AMBULATORY_CARE_PROVIDER_SITE_OTHER): Payer: BLUE CROSS/BLUE SHIELD | Admitting: Family Medicine

## 2016-05-19 ENCOUNTER — Encounter: Payer: Self-pay | Admitting: Family Medicine

## 2016-05-19 VITALS — BP 137/73 | HR 73 | Wt 284.0 lb

## 2016-05-19 DIAGNOSIS — F411 Generalized anxiety disorder: Secondary | ICD-10-CM | POA: Diagnosis not present

## 2016-05-19 DIAGNOSIS — M25561 Pain in right knee: Secondary | ICD-10-CM

## 2016-05-19 DIAGNOSIS — F332 Major depressive disorder, recurrent severe without psychotic features: Secondary | ICD-10-CM | POA: Diagnosis not present

## 2016-05-19 MED ORDER — SERTRALINE HCL 25 MG PO TABS: ORAL_TABLET | ORAL | 2 refills | Status: DC

## 2016-05-19 MED ORDER — TRAZODONE HCL 50 MG PO TABS: 25.0000 mg | ORAL_TABLET | Freq: Every evening | ORAL | 3 refills | Status: DC | PRN

## 2016-05-19 NOTE — Progress Notes (Signed)
Ann Brock is a 31 y.o. female who presents to [place]: [place] today for right knee pain and depression/anxiety.  Right knee pain: Patient has had ongoing right knee pain intermittently for years. She's had a history of meniscus injury and has had arthroscopic surgery previously. She was doing well until the last few weeks when she's had pain off and off worsening. She's had some swelling occurring intermittently as well. She felt a pop Monday during normal activities and has had worsening pain. She denies any specific injury or fall. She denies any instability but denies currently any locking or catching. She notes that her knee feels full which is limited range of motion because of that. She's tried some over-the-counter medicines for pain which have not helped much. Symptoms are moderate in her limiting her ability to exercise.  Anxiety and depression: Patient is a history of anxiety and depression previously. She previously has been prescribed sertraline and trazodone but did not take it and was somewhat lost to follow-up. She notes recently her home life has worsened. Her mother-in-law was diagnosed with stage IV small cell lung cancer.  She notes that both her and her husband are not taking this very well. Her children are also very worried. She notes worsening stress and anxiety. She has difficulty sleeping and is worried about things. She is interested in restarting medications. She denies any SI or HI.     Past Medical History:  Diagnosis Date  . Chest pain    2014  . Ectopic pregnancy    treated with methotrexate  . Hyperemesis gravidarum   . Hypertension    prior to preg, no meds  . Infection    UTI  . Vaginal Pap smear, abnormal    repeat was normal   Past Surgical History:  Procedure Laterality Date  . CESAREAN SECTION    . CESAREAN SECTION N/A  08/05/2015   Procedure: REPEAT CESAREAN SECTION ;  Surgeon: Donnamae Jude, MD;  Location: [place];  Service: Obstetrics;  Laterality: N/A;  . CHOLECYSTECTOMY    . KNEE SURGERY    . TONSILLECTOMY AND ADENOIDECTOMY     Social History  Substance Use Topics  . Smoking status: Former Research scientist (life sciences)  . Smokeless tobacco: Never Used     Comment: quit  2006  . Alcohol use No     Comment: socially prior to pregnancy   family history includes Alcohol abuse in her paternal grandfather and paternal grandmother; Birth defects in her brother; Cancer in her maternal grandfather, maternal grandmother, paternal grandfather, and paternal grandmother; Cerebral palsy in her sister; Depression in her mother; Diabetes in her maternal grandmother; Drug abuse in her father; Heart disease in her father; Hyperlipidemia in her father; Hypertension in her father and mother; Kidney disease in her sister; Stroke in her father, maternal grandfather, maternal grandmother, paternal grandfather, and paternal grandmother.  ROS as above:  Medications: Current Outpatient Prescriptions  Medication Sig Dispense Refill  . acetaminophen (TYLENOL) 325 MG tablet Take 650 mg by mouth every 6 (six) hours as needed for headache.     Marland Kitchen BREWERS YEAST PO Take by mouth.    . metoCLOPramide (REGLAN) 5 MG/5ML solution Take 10 mLs (10 mg total) by mouth 4 (four) times daily -  before meals and at bedtime. 120 mL 0  . Misc. Devices MISC Disp 1 hospital grade breast pump to use.  EDC 08/05/15 1 each 0  . Prenatal  Vit-Fe Fumarate-FA (MULTIVITAMIN-PRENATAL) 27-0.8 MG TABS tablet Take 1 tablet by mouth daily at 12 noon.    . sertraline (ZOLOFT) 25 MG tablet Take 1 pill po daily x 1 week. Then increase to 2 pills po daily. 30 tablet 2  . traZODone (DESYREL) 50 MG tablet Take 0.5-1 tablets (25-50 mg total) by mouth at bedtime as needed for sleep. 30 tablet 3   No current facility-administered medications for this visit.    Allergies  Allergen  Reactions  . Aspirin Anaphylaxis  . Cephalosporins Anaphylaxis  . Contrast Media  [Iodinated Diagnostic Agents] Anaphylaxis    Health Maintenance Health Maintenance  Topic Date Due  . HEMOGLOBIN A1C  12/15/1987  . PNEUMOCOCCAL POLYSACCHARIDE VACCINE (1) 05/28/1989  . FOOT EXAM  05/28/1997  . OPHTHALMOLOGY EXAM  05/28/1997  . URINE MICROALBUMIN  05/28/1997  . INFLUENZA VACCINE  10/14/2015  . PAP SMEAR  01/02/2018  . TETANUS/TDAP  11/13/2024  . HIV Screening  Completed     Exam:  BP 137/73   Pulse 73   Wt 284 lb (128.8 kg)   BMI 45.84 kg/m  Gen: Well NAD Morbidly obese HEENT: EOMI,  MMM Lungs: Normal work of breathing. CTABL Heart: RRR no MRG Abd: NABS, Soft. Nondistended, Nontender Exts: Brisk capillary refill, warm and well perfused.  Psych: Tearful affect. Alert and oriented normal speech and thought process. MSK: Right knee: Obese mild effusion. Tender palpation medial joint line. Range of motion 0-100. Positive medial McMurray's test. Stable ligamentous exam.  Depression screen [facility] 2/9 05/19/2016 02/11/2016 07/10/2015  Decreased Interest 2 3 0  Down, Depressed, Hopeless 2 3 0  PHQ - 2 Score 4 6 0  Altered sleeping 3 3 -  Tired, decreased energy 3 3 -  Change in appetite 2 3 -  Feeling bad or failure about yourself  2 3 -  Trouble concentrating 2 3 -  Moving slowly or fidgety/restless 1 3 -  Suicidal thoughts 0 0 -  PHQ-9 Score 17 24 -  Difficult doing work/chores - Extremely dIfficult -   GAD 7 : Generalized Anxiety Score 05/19/2016 02/11/2016  Nervous, Anxious, on Edge 2 3  Control/stop worrying 3 3  Worry too much - different things 3 3  Trouble relaxing 3 3  Restless 2 3  Easily annoyed or irritable 3 3  Afraid - awful might happen 2 3  Total GAD 7 Score 18 21  Anxiety Difficulty - Extremely difficult    Procedure: Real-time Ultrasound Guided Injection of right knee  Device: GE Logiq E  Images permanently stored and available for review in the  ultrasound unit. Verbal informed consent obtained. Discussed risks and benefits of procedure. Warned about infection bleeding damage to structures skin hypopigmentation and fat atrophy among others. Patient expresses understanding and agreement Time-out conducted.  Noted no overlying erythema, induration, or other signs of local infection.  Skin prepped in a sterile fashion.  Local anesthesia: Topical Ethyl chloride.  With sterile technique and under real time ultrasound guidance: 80 mg of Kenalog and 4 mL of Marcaine injected easily.  Completed without difficulty  Pain immediately resolved suggesting accurate placement of the medication.  Advised to call if fevers/chills, erythema, induration, drainage, or persistent bleeding.  Images permanently stored and available for review in the ultrasound unit.  Impression: Technically successful ultrasound guided injection.  Ultrasound guidance needed due to body habitus   No results found for this or any previous visit (from the past 72 hour(s)). No results found.  Assessment and Plan: 31 y.o. female with  Right knee pain: Concern for meniscus injury exacerbation of existing DJD. Plan for diagnostic and therapeutic injection and prompt follow-up in a few weeks. If not better would consider MRI to evaluate for meniscus injury.  Anxiety and depression: Worsening. This is also associated with some insomnia. Plan to restart low-dose Zoloft and trazodone. We'll titrate Zoloft and plan: 100 mg daily in the near future. Recheck in 1-2 weeks.  Of note I have agreed to become the PCP of her children. I believe they would benefit from referral to [facility] counseling regarding the poor prognosis of the grandmother.    No orders of the defined types were placed in this encounter.  Meds ordered this encounter  Medications  . sertraline (ZOLOFT) 25 MG tablet    Sig: Take 1 pill po daily x 1 week. Then increase to 2 pills po daily.     Dispense:  30 tablet    Refill:  2  . traZODone (DESYREL) 50 MG tablet    Sig: Take 0.5-1 tablets (25-50 mg total) by mouth at bedtime as needed for sleep.    Dispense:  30 tablet    Refill:  3     Discussed warning signs or symptoms. Please see discharge instructions. Patient expresses understanding.

## 2016-05-19 NOTE — Patient Instructions (Addendum)
Thank you for coming in today. For your knee.  Call or go to the ER if you develop a large red swollen joint with extreme pain or oozing puss.  We will see each other in 1-2 weeks.  Let me know how you are doing.  For stress and anxiety we will start zoloft.  Take 1 pill daily for 1 week and then increase to 2 pills daily.   Recheck in 1-2 weeks.   Get you kids into [facility] or some other form on counseling.  They are welcome to establish care here.   / Generalized Anxiety Disorder, Adult Generalized anxiety disorder (GAD) is a mental health disorder. People with this condition constantly worry about everyday events. Unlike normal anxiety, worry related to GAD is not triggered by a specific event. These worries also do not fade or get better with time. GAD interferes with life functions, including relationships, work, and school. GAD can vary from mild to severe. People with severe GAD can have intense waves of anxiety with physical symptoms (panic attacks). What are the causes? The exact cause of GAD is not known. What increases the risk? This condition is more likely to develop in:  Women.  People who have a family history of anxiety disorders.  People who are very shy.  People who experience very stressful life events, such as the death of a loved one.  People who have a very stressful family environment. What are the signs or symptoms? People with GAD often worry excessively about many things in their lives, such as their health and family. They may also be overly concerned about:  Doing well at work.  Being on time.  Natural disasters.  Friendships. Physical symptoms of GAD include:  Fatigue.  Muscle tension or having muscle twitches.  Trembling or feeling shaky.  Being easily startled.  Feeling like your heart is pounding or racing.  Feeling out of breath or like you cannot take a deep breath.  Having trouble falling asleep or staying  asleep.  Sweating.  Nausea, diarrhea, or irritable bowel syndrome (IBS).  Headaches.  Trouble concentrating or remembering facts.  Restlessness.  Irritability. How is this diagnosed? Your health care provider can diagnose GAD based on your symptoms and medical history. You will also have a physical exam. The health care provider will ask specific questions about your symptoms, including how severe they are, when they started, and if they come and go. Your health care provider may ask you about your use of alcohol or drugs, including prescription medicines. Your health care provider may refer you to a mental health specialist for further evaluation. Your health care provider will do a thorough examination and may perform additional tests to rule out other possible causes of your symptoms. To be diagnosed with GAD, a person must have anxiety that:  Is out of his or her control.  Affects several different aspects of his or her life, such as work and relationships.  Causes distress that makes him or her unable to take part in normal activities.  Includes at least three physical symptoms of GAD, such as restlessness, fatigue, trouble concentrating, irritability, muscle tension, or sleep problems. Before your health care provider can confirm a diagnosis of GAD, these symptoms must be present more days than they are not, and they must last for six months or longer. How is this treated? The following therapies are usually used to treat GAD:  Medicine. Antidepressant medicine is usually prescribed for long-term daily control. Antianxiety  medicines may be added in severe cases, especially when panic attacks occur.  Talk therapy (psychotherapy). Certain types of talk therapy can be helpful in treating GAD by providing support, education, and guidance. Options include:  Cognitive behavioral therapy (CBT). People learn coping skills and techniques to ease their anxiety. They learn to identify  unrealistic or negative thoughts and behaviors and to replace them with positive ones.  Acceptance and commitment therapy (ACT). This treatment teaches people how to be mindful as a way to cope with unwanted thoughts and feelings.  Biofeedback. This process trains you to manage your body's response (physiological response) through breathing techniques and relaxation methods. You will work with a therapist while machines are used to monitor your physical symptoms.  Stress management techniques. These include yoga, meditation, and exercise. A mental health specialist can help determine which treatment is best for you. Some people see improvement with one type of therapy. However, other people require a combination of therapies. Follow these instructions at home:  Take over-the-counter and prescription medicines only as told by your health care provider.  Try to maintain a normal routine.  Try to anticipate stressful situations and allow extra time to manage them.  Practice any stress management or self-calming techniques as taught by your health care provider.  Do not punish yourself for setbacks or for not making progress.  Try to recognize your accomplishments, even if they are small.  Keep all follow-up visits as told by your health care provider. This is important. Contact a health care provider if:  Your symptoms do not get better.  Your symptoms get worse.  You have signs of depression, such as:  A persistently sad, cranky, or irritable mood.  Loss of enjoyment in activities that used to bring you joy.  Change in weight or eating.  Changes in sleeping habits.  Avoiding friends or family members.  Loss of energy for normal tasks.  Feelings of guilt or worthlessness. Get help right away if:  You have serious thoughts about hurting yourself or others. If you ever feel like you may hurt yourself or others, or have thoughts about taking your own life, get help right  away. You can go to your nearest emergency department or call:  Your local emergency services (911 in the [place]  A suicide crisis helpline, such as the [facility] at 850 830 8759. This is open 24 hours a day. Summary  Generalized anxiety disorder (GAD) is a mental health disorder that involves worry that is not triggered by a specific event.  People with GAD often worry excessively about many things in their lives, such as their health and family.  GAD may cause physical symptoms such as restlessness, trouble concentrating, sleep problems, frequent sweating, nausea, diarrhea, headaches, and trembling or muscle twitching.  A mental health specialist can help determine which treatment is best for you. Some people see improvement with one type of therapy. However, other people require a combination of therapies. This information is not intended to replace advice given to you by your health care provider. Make sure you discuss any questions you have with your health care provider. Document Released: 06/26/2012 Document Revised: 01/20/2016 Document Reviewed: 01/20/2016 Elsevier Interactive Patient Education  2017 [employer].

## 2016-05-28 ENCOUNTER — Ambulatory Visit: Payer: BLUE CROSS/BLUE SHIELD | Admitting: Family Medicine

## 2016-06-14 ENCOUNTER — Ambulatory Visit (INDEPENDENT_AMBULATORY_CARE_PROVIDER_SITE_OTHER): Payer: BLUE CROSS/BLUE SHIELD | Admitting: Family Medicine

## 2016-06-14 ENCOUNTER — Encounter: Payer: Self-pay | Admitting: Family Medicine

## 2016-06-14 DIAGNOSIS — F332 Major depressive disorder, recurrent severe without psychotic features: Secondary | ICD-10-CM

## 2016-06-14 DIAGNOSIS — F411 Generalized anxiety disorder: Secondary | ICD-10-CM

## 2016-06-14 MED ORDER — SERTRALINE HCL 50 MG PO TABS: 50.0000 mg | ORAL_TABLET | Freq: Every day | ORAL | 3 refills | Status: DC

## 2016-06-14 NOTE — Progress Notes (Signed)
Ann Brock is a 31 y.o. female who presents to [place]: [place] today for follow-up mood. Patient is currently taking 50 mg of Zoloft and feeling great. Her anxiety and depression symptoms have resolved.   Past Medical History:  Diagnosis Date  . Chest pain    2014  . Ectopic pregnancy    treated with methotrexate  . Hyperemesis gravidarum   . Hypertension    prior to preg, no meds  . Infection    UTI  . Vaginal Pap smear, abnormal    repeat was normal   Past Surgical History:  Procedure Laterality Date  . CESAREAN SECTION    . CESAREAN SECTION N/A 08/05/2015   Procedure: REPEAT CESAREAN SECTION ;  Surgeon: Donnamae Jude, MD;  Location: [place];  Service: Obstetrics;  Laterality: N/A;  . CHOLECYSTECTOMY    . KNEE SURGERY    . TONSILLECTOMY AND ADENOIDECTOMY     Social History  Substance Use Topics  . Smoking status: Former Research scientist (life sciences)  . Smokeless tobacco: Never Used     Comment: quit  2006  . Alcohol use No     Comment: socially prior to pregnancy   family history includes Alcohol abuse in her paternal grandfather and paternal grandmother; Birth defects in her brother; Cancer in her maternal grandfather, maternal grandmother, paternal grandfather, and paternal grandmother; Cerebral palsy in her sister; Depression in her mother; Diabetes in her maternal grandmother; Drug abuse in her father; Heart disease in her father; Hyperlipidemia in her father; Hypertension in her father and mother; Kidney disease in her sister; Stroke in her father, maternal grandfather, maternal grandmother, paternal grandfather, and paternal grandmother.  ROS as above:  Medications: Current Outpatient Prescriptions  Medication Sig Dispense Refill  . acetaminophen (TYLENOL) 325 MG tablet Take 650 mg by mouth every 6 (six) hours as needed for headache.     Marland Kitchen BREWERS YEAST  PO Take by mouth.    . metoCLOPramide (REGLAN) 5 MG/5ML solution Take 10 mLs (10 mg total) by mouth 4 (four) times daily -  before meals and at bedtime. 120 mL 0  . Misc. Devices MISC Disp 1 hospital grade breast pump to use.  [facility] 08/05/15 1 each 0  . Prenatal Vit-Fe Fumarate-FA (MULTIVITAMIN-PRENATAL) 27-0.8 MG TABS tablet Take 1 tablet by mouth daily at 12 noon.    . sertraline (ZOLOFT) 50 MG tablet Take 1 tablet (50 mg total) by mouth daily. Take 1 pill po daily x 1 week. Then increase to 2 pills po daily. 90 tablet 3  . traZODone (DESYREL) 50 MG tablet Take 0.5-1 tablets (25-50 mg total) by mouth at bedtime as needed for sleep. 30 tablet 3   No current facility-administered medications for this visit.    Allergies  Allergen Reactions  . Aspirin Anaphylaxis  . Cephalosporins Anaphylaxis  . Contrast Media  [Iodinated Diagnostic Agents] Anaphylaxis    Health Maintenance Health Maintenance  Topic Date Due  . INFLUENZA VACCINE  11/13/2016 (Originally 10/13/2016)  . PAP SMEAR  01/02/2018  . TETANUS/TDAP  11/13/2024  . HIV Screening  Completed     Exam:  BP 134/73   Pulse 82   Wt 279 lb (126.6 kg)   BMI 45.03 kg/m  Gen: Well NAD Psych alert and oriented normal speech some process and affect  Depression screen [facility] 2/9 06/14/2016 05/19/2016 02/11/2016 07/10/2015  Decreased Interest 0 2 3 0  Down, Depressed, Hopeless 0 2 3 0  PHQ - 2 Score 0 4 6 0  Altered sleeping - 3 3 -  Tired, decreased energy - 3 3 -  Change in appetite - 2 3 -  Feeling bad or failure about yourself  - 2 3 -  Trouble concentrating - 2 3 -  Moving slowly or fidgety/restless - 1 3 -  Suicidal thoughts - 0 0 -  PHQ-9 Score - 17 24 -  Difficult doing work/chores - - Extremely dIfficult -     No results found for this or any previous visit (from the past 72 hour(s)). No results found.    Assessment and Plan: 31 y.o. female with Mood: Improved. Continue Zoloft 50 mg. Recheck as needed   No orders of the  defined types were placed in this encounter.  Meds ordered this encounter  Medications  . sertraline (ZOLOFT) 50 MG tablet    Sig: Take 1 tablet (50 mg total) by mouth daily. Take 1 pill po daily x 1 week. Then increase to 2 pills po daily.    Dispense:  90 tablet    Refill:  3     Discussed warning signs or symptoms. Please see discharge instructions. Patient expresses understanding.

## 2016-06-15 LAB — PROGESTERONE: Progesterone: <0.5 ng/mL

## 2016-06-15 LAB — LUTEINIZING HORMONE: LH: 8.5 m[IU]/mL

## 2016-06-15 LAB — TSH: TSH: 1.06 mIU/L

## 2016-06-15 LAB — FOLLICLE STIMULATING HORMONE: FSH: 6.9 m[IU]/mL

## 2016-06-15 LAB — TESTOSTERONE: Testosterone: 35 ng/dL

## 2016-06-17 ENCOUNTER — Encounter: Payer: Self-pay | Admitting: Family Medicine

## 2016-06-19 LAB — ESTROGENS, TOTAL: ESTROGEN: 249.1 pg/mL

## 2016-08-16 ENCOUNTER — Ambulatory Visit: Payer: BLUE CROSS/BLUE SHIELD | Admitting: Family Medicine

## 2016-08-16 DIAGNOSIS — Z0189 Encounter for other specified special examinations: Secondary | ICD-10-CM

## 2016-11-14 ENCOUNTER — Emergency Department (INDEPENDENT_AMBULATORY_CARE_PROVIDER_SITE_OTHER)
Admission: EM | Admit: 2016-11-14 | Discharge: 2016-11-14 | Disposition: A | Discharge status: Home / Self Care | Payer: BLUE CROSS/BLUE SHIELD | Source: Home / Self Care | Attending: Family Medicine | Admitting: Family Medicine

## 2016-11-14 ENCOUNTER — Emergency Department (INDEPENDENT_AMBULATORY_CARE_PROVIDER_SITE_OTHER): Payer: BLUE CROSS/BLUE SHIELD

## 2016-11-14 ENCOUNTER — Encounter: Payer: Self-pay | Admitting: Emergency Medicine

## 2016-11-14 DIAGNOSIS — R0781 Pleurodynia: Secondary | ICD-10-CM

## 2016-11-14 DIAGNOSIS — S29012A Strain of muscle and tendon of back wall of thorax, initial encounter: Secondary | ICD-10-CM | POA: Diagnosis not present

## 2016-11-14 MED ORDER — HYDROCODONE-ACETAMINOPHEN 5-325 MG PO TABS: 1.0000 | ORAL_TABLET | Freq: Four times a day (QID) | ORAL | 0 refills | Status: DC | PRN

## 2016-11-14 MED ORDER — CYCLOBENZAPRINE HCL 10 MG PO TABS: 10.0000 mg | ORAL_TABLET | Freq: Three times a day (TID) | ORAL | 1 refills | Status: DC

## 2016-11-14 NOTE — ED Provider Notes (Signed)
[facility]    CSN: [id number] Arrival date & time: 11/14/16  1619     History   Chief Complaint Chief Complaint  Patient presents with  . Back Pain    HPI Ann Brock is a 31 y.o. female.   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   The history is provided by the patient.  Back Pain  Location:  Lumbar spine and thoracic spine Quality:  Aching Radiates to:  Does not radiate Pain severity:  Severe Pain is:  Same all the time Onset quality:  Sudden Duration:  1 day Timing:  Constant Progression:  Worsening Chronicity:  New Context: lifting heavy objects   Relieved by:  Nothing Worsened by:  Deep breathing and movement Ineffective treatments:  Heating pad, cold packs, ibuprofen and OTC medications Associated symptoms: no abdominal pain, no leg pain, no numbness, no paresthesias, no tingling and no weakness   Risk factors: obesity     Past Medical History:  Diagnosis Date  . Chest pain    2014  . Ectopic pregnancy    treated with methotrexate  . Hyperemesis gravidarum   . Hypertension    prior to preg, no meds  . Infection    UTI  . Vaginal Pap smear, abnormal    repeat was normal    Patient Active Problem List   Diagnosis Date Noted  . Morbid obesity ([place]) 05/19/2016  . Right knee pain 05/19/2016  . Hirsutism 02/11/2016  . Major depression 02/11/2016  . GAD (generalized anxiety disorder) 02/11/2016  . Status post repeat low transverse cesarean section 08/05/2015  . Acid reflux 05/12/2015  . History of ectopic pregnancy 12/04/2014    Past Surgical History:  Procedure Laterality Date  . CESAREAN SECTION    . CESAREAN SECTION N/A 08/05/2015   Procedure: REPEAT CESAREAN SECTION ;  Surgeon: Donnamae Jude, MD;  Location: [place];  Service: Obstetrics;  Laterality: N/A;  . CHOLECYSTECTOMY    . KNEE SURGERY    . TONSILLECTOMY AND ADENOIDECTOMY       OB History    Gravida Para Term Preterm AB Living   5 3 3   2 3    SAB TAB Ectopic Multiple Live Births   1   1 0 3      Obstetric Comments   MTX       Home Medications    Prior to Admission medications   Medication Sig Start Date End Date Taking? Authorizing Provider  acetaminophen (TYLENOL) 325 MG tablet Take 650 mg by mouth every 6 (six) hours as needed for headache.     [provider]  BREWERS YEAST PO Take by mouth.    [provider]  cyclobenzaprine (FLEXERIL) 10 MG tablet Take 1 tablet (10 mg total) by mouth 3 (three) times daily. 11/14/16   Kandra Nicolas, MD  HYDROcodone-acetaminophen (NORCO/VICODIN) 5-325 MG tablet Take 1 tablet by mouth every 6 (six) hours as needed for moderate pain. 11/14/16   Kandra Nicolas, MD  metoCLOPramide (REGLAN) 5 MG/5ML solution Take 10 mLs (10 mg total) by mouth 4 (four) times daily -  before meals and at bedtime. 09/25/15   Emily Filbert, MD  Misc. Devices MISC Disp 1 hospital grade breast pump to use.  [facility] 08/05/15 07/17/15   Guss Bunde, MD  Prenatal Vit-Fe Fumarate-FA (MULTIVITAMIN-PRENATAL) 27-0.8 MG TABS tablet Take 1 tablet by mouth daily at  12 noon.    [provider]  sertraline (ZOLOFT) 50 MG tablet Take 1 tablet (50 mg total) by mouth daily. Take 1 pill po daily x 1 week. Then increase to 2 pills po daily. 06/14/16   Gregor Hams, MD  traZODone (DESYREL) 50 MG tablet Take 0.5-1 tablets (25-50 mg total) by mouth at bedtime as needed for sleep. 05/19/16   Gregor Hams, MD    Family History Family History  Problem Relation Age of Onset  . Hypertension Mother   . Depression Mother   . Drug abuse Father   . Heart disease Father   . Hyperlipidemia Father   . Hypertension Father   . Stroke Father   . Cancer Maternal Grandmother   . Diabetes Maternal Grandmother   . Stroke Maternal Grandmother   . Cancer Maternal Grandfather   . Stroke Maternal Grandfather   . Alcohol abuse Paternal Grandmother   .  Cancer Paternal Grandmother   . Stroke Paternal Grandmother   . Alcohol abuse Paternal Grandfather   . Cancer Paternal Grandfather   . Stroke Paternal Grandfather   . Cerebral palsy Sister   . Kidney disease Sister   . Birth defects Brother        vsd    Social History Social History  Substance Use Topics  . Smoking status: Former Research scientist (life sciences)  . Smokeless tobacco: Never Used     Comment: quit  2006  . Alcohol use No     Comment: socially prior to pregnancy     Allergies   Aspirin; Cephalosporins; and Contrast media  [iodinated diagnostic agents]   Review of Systems Review of Systems  Gastrointestinal: Negative for abdominal pain.  Musculoskeletal: Positive for back pain.  Neurological: Negative for tingling, weakness, numbness and paresthesias.  All other systems reviewed and are negative.    Physical Exam Triage Vital Signs ED Triage Vitals [11/14/16 1701]  Enc Vitals Group     BP 130/82     Pulse Rate 78     Resp      Temp 98.1 F (36.7 C)     Temp Source Oral     SpO2 100 %     Weight 286 lb (129.7 kg)     Height      Head Circumference      Peak Flow      Pain Score 10     Pain Loc      Pain Edu?      Excl. in [place]?    No data found.   Updated Vital Signs BP 130/82 (BP Location: Right Arm)   Pulse 78   Temp 98.1 F (36.7 C) (Oral)   Wt 286 lb (129.7 kg)   LMP 08/14/2016   SpO2 100%   Breastfeeding? No   BMI 46.16 kg/m   Visual Acuity Right Eye Distance:   Left Eye Distance:   Bilateral Distance:    Right Eye Near:   Left Eye Near:    Bilateral Near:     Physical Exam  Constitutional: She appears well-developed and well-nourished. No distress.  HENT:  Head: Normocephalic.  Right Ear: External ear normal.  Left Ear: External ear normal.  Nose: Nose normal.  Mouth/Throat: Oropharynx is clear and moist.  Eyes: Pupils are equal, round, and reactive to light. Conjunctivae are normal.  Neck: Normal range of motion.  Cardiovascular: Normal  heart sounds.   Pulmonary/Chest: Effort normal and breath sounds normal. No respiratory distress.  There is distinct tenderness over medial and inferior edges of left scapula.  Pain elicited by resisted abduction of left shoulder while palpating left rhomboid muscles.   Abdominal: There is no tenderness.  Musculoskeletal: She exhibits no edema.  Neurological: She is alert.  Skin: Skin is warm and dry. No rash noted.  Nursing note and vitals reviewed.    UC Treatments / Results  Labs (all labs ordered are listed, but only abnormal results are displayed) Labs Reviewed - No data to display  EKG  EKG Interpretation None       Radiology Dg Ribs Unilateral W/chest Left  Result Date: 11/14/2016 CLINICAL DATA:  Patient felt a pop in the left posterolateral rib with pain since yesterday. EXAM: LEFT RIBS AND CHEST - 3+ VIEW COMPARISON:  None. FINDINGS: No fracture or other bone lesions are seen involving the ribs. There is no evidence of pneumothorax or pleural effusion. Both lungs are clear. Heart size and mediastinal contours are within normal limits. IMPRESSION: Negative. Electronically Signed   By: Abelardo Diesel M.D.   On: 11/14/2016 18:10    Procedures Procedures (including critical care time)  Medications Ordered in UC Medications - No data to display   Initial Impression / Assessment and Plan / UC Course  I have reviewed the triage vital signs and the nursing notes.  Pertinent labs & imaging results that were available during my care of the patient were reviewed by me and considered in my medical decision making (see chart for details).    Begin Flexeril 10mg  TID, and Lortab Q6hr prn (#15, no refill). Controlled Substance Prescriptions I have consulted the [place] Controlled Substances Registry for this patient, and feel the risk/benefit ratio today is favorable for proceeding with this prescription for a controlled substance.  Apply ice pack for 20 to 30 minutes, 3 to 4  times daily  Continue until pain and swelling decrease.  Continue Ibuprofen 200mg , 4 tabs every 8 hours with food.   Begin range of motion and stretching exercises as tolerated. Followup with Dr. Lynne Leader ([facility]) if not improving about two weeks.     Final Clinical Impressions(s) / UC Diagnoses   Final diagnoses:  Rhomboid muscle strain, initial encounter    New Prescriptions New Prescriptions   CYCLOBENZAPRINE (FLEXERIL) 10 MG TABLET    Take 1 tablet (10 mg total) by mouth 3 (three) times daily.   HYDROCODONE-ACETAMINOPHEN (NORCO/VICODIN) 5-325 MG TABLET    Take 1 tablet by mouth every 6 (six) hours as needed for moderate pain.         Kandra Nicolas, MD 11/21/16 [phone]

## 2016-11-14 NOTE — ED Triage Notes (Signed)
Pt c/o left sided back pain that started yesterday. She has taken tylenol, motrin, used heat and ice with no relief. Last dose of meds was 330pm.

## 2016-11-14 NOTE — Discharge Instructions (Signed)
Apply ice pack for 20 to 30 minutes, 3 to 4 times daily  Continue until pain and swelling decrease.  Continue Ibuprofen 200mg , 4 tabs every 8 hours with food.   Begin range of motion and stretching exercises as tolerated.

## 2017-03-14 ENCOUNTER — Ambulatory Visit: Payer: BLUE CROSS/BLUE SHIELD

## 2017-03-14 ENCOUNTER — Ambulatory Visit: Payer: BLUE CROSS/BLUE SHIELD | Admitting: Family Medicine

## 2017-03-14 ENCOUNTER — Ambulatory Visit (INDEPENDENT_AMBULATORY_CARE_PROVIDER_SITE_OTHER): Payer: BLUE CROSS/BLUE SHIELD

## 2017-03-14 ENCOUNTER — Encounter: Payer: Self-pay | Admitting: Family Medicine

## 2017-03-14 VITALS — BP 123/85 | HR 78 | Ht 66.0 in | Wt 281.0 lb

## 2017-03-14 DIAGNOSIS — F332 Major depressive disorder, recurrent severe without psychotic features: Secondary | ICD-10-CM

## 2017-03-14 DIAGNOSIS — M7989 Other specified soft tissue disorders: Secondary | ICD-10-CM | POA: Diagnosis not present

## 2017-03-14 DIAGNOSIS — I83811 Varicose veins of right lower extremities with pain: Secondary | ICD-10-CM

## 2017-03-14 DIAGNOSIS — F411 Generalized anxiety disorder: Secondary | ICD-10-CM

## 2017-03-14 DIAGNOSIS — Z23 Encounter for immunization: Secondary | ICD-10-CM

## 2017-03-14 MED ORDER — SERTRALINE HCL 50 MG PO TABS: 50.0000 mg | ORAL_TABLET | Freq: Every day | ORAL | 3 refills | Status: DC

## 2017-03-14 MED ORDER — DICLOFENAC SODIUM 1 % TD GEL
4.0000 g | Freq: Four times a day (QID) | TRANSDERMAL | 11 refills | Status: DC
Start: 2017-03-14 — End: 2017-08-16

## 2017-03-14 NOTE — Patient Instructions (Addendum)
Thank you for coming in today. Get xray and [place] today.  Apply voltaren gel for pain up to 4x daily for both knee and ankle  Keep a home blood pressure log.  Recheck in 1 month for pain, and anxiety and blood pressure.   Restart Zoloft taper.

## 2017-03-14 NOTE — Progress Notes (Signed)
Ann Brock is a 31 y.o. female who presents to [place]: [place] today for right ankle and knee pain.  Ann Brock notes pain in her right ankle associated with swelling.  This is been ongoing now for a few weeks.  She denies any injury.  She notes that she has a history of varicose veins which have been worse recently especially on her right side.  She does the pain is worse with prolonged standing and activity and better with rest.  She is tried some ibuprofen which is helped a little.  She also notes right-sided knee pain.  This is been previously evaluated and thought to be DJD.  She has had some ibuprofen which is helped a bit as well.   Past Medical History:  Diagnosis Date  . Chest pain    2014  . Ectopic pregnancy    treated with methotrexate  . Hyperemesis gravidarum   . Hypertension    prior to preg, no meds  . Infection    UTI  . Vaginal Pap smear, abnormal    repeat was normal   Past Surgical History:  Procedure Laterality Date  . CESAREAN SECTION    . CESAREAN SECTION N/A 08/05/2015   Procedure: REPEAT CESAREAN SECTION ;  Surgeon: Ann Jude, MD;  Location: [place];  Service: Obstetrics;  Laterality: N/A;  . CHOLECYSTECTOMY    . KNEE SURGERY    . TONSILLECTOMY AND ADENOIDECTOMY     Social History   Tobacco Use  . Smoking status: Former Research scientist (life sciences)  . Smokeless tobacco: Never Used  . Tobacco comment: quit  2006  Substance Use Topics  . Alcohol use: No    Alcohol/week: 0.0 oz    Comment: socially prior to pregnancy   family history includes Alcohol abuse in her paternal grandfather and paternal grandmother; Birth defects in her brother; Cancer in her maternal grandfather, maternal grandmother, paternal grandfather, and paternal grandmother; Cerebral palsy in her sister; Depression in her mother; Diabetes in her maternal grandmother;  Drug abuse in her father; Heart disease in her father; Hyperlipidemia in her father; Hypertension in her father and mother; Kidney disease in her sister; Stroke in her father, maternal grandfather, maternal grandmother, paternal grandfather, and paternal grandmother.  ROS as above:  Medications: Current Outpatient Medications  Medication Sig Dispense Refill  . acetaminophen (TYLENOL) 325 MG tablet Take 650 mg by mouth every 6 (six) hours as needed for headache.     Marland Kitchen BREWERS YEAST PO Take by mouth.    . cyclobenzaprine (FLEXERIL) 10 MG tablet Take 1 tablet (10 mg total) by mouth 3 (three) times daily. 20 tablet 1  . metoCLOPramide (REGLAN) 5 MG/5ML solution Take 10 mLs (10 mg total) by mouth 4 (four) times daily -  before meals and at bedtime. 120 mL 0  . Misc. Devices MISC Disp 1 hospital grade breast pump to use.  [facility] 08/05/15 1 each 0  . Prenatal Vit-Fe Fumarate-FA (MULTIVITAMIN-PRENATAL) 27-0.8 MG TABS tablet Take 1 tablet by mouth daily at 12 noon.    . sertraline (ZOLOFT) 50 MG tablet Take 1 tablet (50 mg total) by mouth daily. Take 1 pill po daily x 1 week. Then increase to 2 pills po daily. 90 tablet 3  . traZODone (DESYREL) 50 MG tablet Take 0.5-1 tablets (25-50 mg total) by mouth at bedtime as needed for sleep. 30 tablet 3  . diclofenac sodium (VOLTAREN) 1 % GEL Apply 4  g topically 4 (four) times daily. To affected joint. 100 g 11   No current facility-administered medications for this visit.    Allergies  Allergen Reactions  . Aspirin Anaphylaxis  . Cephalosporins Anaphylaxis  . Contrast Media  [Iodinated Diagnostic Agents] Anaphylaxis    Health Maintenance Health Maintenance  Topic Date Due  . INFLUENZA VACCINE  10/13/2016  . PAP SMEAR  01/02/2018  . TETANUS/TDAP  11/13/2024  . HIV Screening  Completed     Exam:  BP 123/85   Pulse 78   Ht 5\' 6"  (1.676 m)   Wt 281 lb (127.5 kg)   BMI 45.35 kg/m  Gen: Well NAD HEENT: EOMI,  MMM Lungs: Normal work of breathing.  CTABL Heart: RRR no MRG Abd: NABS, Soft. Nondistended, Nontender Exts: Brisk capillary refill, warm and well perfused.  Right ankle significant for varicosities present throughout right lower leg.  Right calf is slightly enlarged with no palpable cords. The right ankle is tender to palpation at the medial aspect with no significant swelling. Ankle stability is normal.  Strength is normal.  Pulses capillary refill and sensation are intact  X-ray right ankle pending  Right leg duplex ultrasound pending   No results found for this or any previous visit (from the past 72 hour(s)). Dg Ankle Complete Right  Result Date: 03/14/2017 CLINICAL DATA:  31 year old female with a history of right medial malleolus ankle pain EXAM: RIGHT ANKLE - COMPLETE 3+ VIEW COMPARISON:  None. FINDINGS: No acute displaced fracture. Ankle mortise appears congruent. Mild degenerative changes of the calcaneus. No focal soft tissue swelling. Dilated venous varicosities of the soft tissues. IMPRESSION: Negative for acute bony abnormality. Dilated venous varicosities of the lower leg Electronically Signed   By: Corrie Mckusick D.O.   On: 03/14/2017 12:03      Assessment and Plan: 31 y.o. female with  Right ankle pain likely DJD.  Varicosities could be a component here as well and we should rule out DVT to explain worsening varicose veins.  Ultrasound pending.  Empiric treatment with diclofenac gel for both knee and ankle DJD consider injection if not better.   Zoloft refilled her anxiety and depression.  Recommend patient follow-up in about a month to recheck these chronic medical issues as well.  Influenza vaccine given today prior to discharge.  Orders Placed This Encounter  Procedures  . US Venous Img Lower Unilateral Right    Standing Status:   Future    Standing Expiration Date:   05/13/2018    Order Specific Question:   Reason for Exam (SYMPTOM  OR DIAGNOSIS REQUIRED)    Answer:   eval poss DVT    Order  Specific Question:   Preferred imaging location?    Answer:   [facility]  . DG Ankle Complete Right    Standing Status:   Future    Number of Occurrences:   1    Standing Expiration Date:   05/13/2018    Order Specific Question:   Reason for Exam (SYMPTOM  OR DIAGNOSIS REQUIRED)    Answer:   eval pain and swelling right leg    Order Specific Question:   Is patient pregnant?    Answer:   No    Order Specific Question:   Preferred imaging location?    Answer:   [facility]    Order Specific Question:   Radiology Contrast Protocol - do NOT remove file path    Answer:   [email or web address]  . Flu Vaccine QUAD  36+ mos IM    [facility]   Meds ordered this encounter  Medications  . diclofenac sodium (VOLTAREN) 1 % GEL    Sig: Apply 4 g topically 4 (four) times daily. To affected joint.    Dispense:  100 g    Refill:  11  . sertraline (ZOLOFT) 50 MG tablet    Sig: Take 1 tablet (50 mg total) by mouth daily. Take 1 pill po daily x 1 week. Then increase to 2 pills po daily.    Dispense:  90 tablet    Refill:  3     Discussed warning signs or symptoms. Please see discharge instructions. Patient expresses understanding.

## 2017-04-14 ENCOUNTER — Ambulatory Visit: Payer: BLUE CROSS/BLUE SHIELD | Admitting: Family Medicine

## 2017-04-21 ENCOUNTER — Ambulatory Visit: Payer: BLUE CROSS/BLUE SHIELD | Admitting: Family Medicine

## 2017-04-21 DIAGNOSIS — Z0189 Encounter for other specified special examinations: Secondary | ICD-10-CM

## 2017-04-28 ENCOUNTER — Ambulatory Visit: Payer: BLUE CROSS/BLUE SHIELD | Admitting: Family Medicine

## 2017-05-10 ENCOUNTER — Ambulatory Visit: Payer: BLUE CROSS/BLUE SHIELD | Admitting: Family Medicine

## 2017-05-10 ENCOUNTER — Encounter: Payer: Self-pay | Admitting: Family Medicine

## 2017-05-10 VITALS — BP 146/89 | HR 76 | Ht 66.0 in | Wt 285.0 lb

## 2017-05-10 DIAGNOSIS — F4321 Adjustment disorder with depressed mood: Secondary | ICD-10-CM | POA: Diagnosis not present

## 2017-05-10 DIAGNOSIS — I10 Essential (primary) hypertension: Secondary | ICD-10-CM

## 2017-05-10 DIAGNOSIS — F332 Major depressive disorder, recurrent severe without psychotic features: Secondary | ICD-10-CM | POA: Diagnosis not present

## 2017-05-10 DIAGNOSIS — F411 Generalized anxiety disorder: Secondary | ICD-10-CM

## 2017-05-10 DIAGNOSIS — R109 Unspecified abdominal pain: Secondary | ICD-10-CM

## 2017-05-10 MED ORDER — SERTRALINE HCL 100 MG PO TABS: 100.0000 mg | ORAL_TABLET | Freq: Every day | ORAL | 1 refills | Status: DC

## 2017-05-10 MED ORDER — DICYCLOMINE HCL 10 MG PO CAPS: 10.0000 mg | ORAL_CAPSULE | Freq: Three times a day (TID) | ORAL | 1 refills | Status: DC

## 2017-05-10 MED ORDER — HYDROCHLOROTHIAZIDE 25 MG PO TABS: 25.0000 mg | ORAL_TABLET | Freq: Every day | ORAL | 2 refills | Status: DC

## 2017-05-10 NOTE — Progress Notes (Signed)
Ann Brock is a 32 y.o. female who presents to [facility]: [place] today for follow up BP, diarrhea, grief, anxiety and depression.   HTN; Ann Brock followed for elevated blood pressure for some time now.  She is been checking her blood pressure at home and notes that it is typically in the 140s over 90s.  She denies chest pain palpitations shortness of breath.  She prevents pregnancy via IUD.   Grief: Ann Brock notes that her mother in law died about 1 month ago at 64 due to small cell lung cancer. The family is devastated and her kids are still processing.  Ann Brock was very close to her mother in law and is still grieving.  She tried once to get started with grief counseling via hospice but never heard back.  She finds herself  crying and thinking of her mother in law.   Depression: Ann Brock's depression has worsening with the death of her mother in law. She continues to take zoloft 50mg  daily. She notes trouble sleeping and fatigue. She denies any SI or HI.    Anxiety: Ann Brock notes that her anxiety has also worsened recently. She continues Zoloft as about. She notes trouble relaxing and irritability.   Diarrhea: [name] notes cramping and diarrhea occurring throughout the day typically worse after eating.  This is been ongoing now for a few months.  No fevers or chills vomiting.  She is tried fiber which did not help.  Additionally she is tried Imodium which did not help much.  She notes a pertinent family history for Crohn's disease.  Patient is status post cholecystectomy  Past Medical History:  Diagnosis Date  . Chest pain    2014  . Ectopic pregnancy    treated with methotrexate  . Hyperemesis gravidarum   . Hypertension    prior to preg, no meds  . Infection    UTI  . Vaginal Pap smear, abnormal    repeat was normal   Past Surgical History:  Procedure  Laterality Date  . CESAREAN SECTION    . CESAREAN SECTION N/A 08/05/2015   Procedure: REPEAT CESAREAN SECTION ;  Surgeon: Donnamae Jude, MD;  Location: [facility];  Service: Obstetrics;  Laterality: N/A;  . CHOLECYSTECTOMY    . KNEE SURGERY    . TONSILLECTOMY AND ADENOIDECTOMY     Social History   Tobacco Use  . Smoking status: Former Research scientist (life sciences)  . Smokeless tobacco: Never Used  . Tobacco comment: quit  2006  Substance Use Topics  . Alcohol use: No    Alcohol/week: 0.0 oz    Comment: socially prior to pregnancy   family history includes Alcohol abuse in her paternal grandfather and paternal grandmother; Birth defects in her brother; Cancer in her maternal grandfather, maternal grandmother, paternal grandfather, and paternal grandmother; Cerebral palsy in her sister; Depression in her mother; Diabetes in her maternal grandmother; Drug abuse in her father; Heart disease in her father; Hyperlipidemia in her father; Hypertension in her father and mother; Kidney disease in her sister; Stroke in her father, maternal grandfather, maternal grandmother, paternal grandfather, and paternal grandmother.  ROS as above:  Medications: Current Outpatient Medications  Medication Sig Dispense Refill  . acetaminophen (TYLENOL) 325 MG tablet Take 650 mg by mouth every 6 (six) hours as needed for headache.     Marland Kitchen BREWERS YEAST PO Take by mouth.    . cyclobenzaprine (FLEXERIL) 10 MG tablet Take 1 tablet (10 mg  total) by mouth 3 (three) times daily. 20 tablet 1  . diclofenac sodium (VOLTAREN) 1 % GEL Apply 4 g topically 4 (four) times daily. To affected joint. 100 g 11  . metoCLOPramide (REGLAN) 5 MG/5ML solution Take 10 mLs (10 mg total) by mouth 4 (four) times daily -  before meals and at bedtime. 120 mL 0  . Misc. Devices MISC Disp 1 hospital grade breast pump to use.  EDC 08/05/15 1 each 0  . Prenatal Vit-Fe Fumarate-FA (MULTIVITAMIN-PRENATAL) 27-0.8 MG TABS tablet Take 1 tablet by mouth daily at 12  noon.    . sertraline (ZOLOFT) 100 MG tablet Take 1 tablet (100 mg total) by mouth daily. Take 1 pill po daily x 1 week. Then increase to 2 pills po daily. 90 tablet 1  . traZODone (DESYREL) 50 MG tablet Take 0.5-1 tablets (25-50 mg total) by mouth at bedtime as needed for sleep. 30 tablet 3  . dicyclomine (BENTYL) 10 MG capsule Take 1 capsule (10 mg total) by mouth 4 (four) times daily -  before meals and at bedtime. 90 capsule 1  . hydrochlorothiazide (HYDRODIURIL) 25 MG tablet Take 1 tablet (25 mg total) by mouth daily. 30 tablet 2   No current facility-administered medications for this visit.    Allergies  Allergen Reactions  . Aspirin Anaphylaxis  . Cephalosporins Anaphylaxis  . Contrast Media  [Iodinated Diagnostic Agents] Anaphylaxis    Health Maintenance Health Maintenance  Topic Date Due  . PAP SMEAR  01/02/2018  . TETANUS/TDAP  11/13/2024  . INFLUENZA VACCINE  Completed  . HIV Screening  Completed     Exam:  BP (!) 146/89   Pulse 76   Ht 5\' 6"  (1.676 m)   Wt 285 lb (129.3 kg)   BMI 46.00 kg/m  Gen: Well NAD obese HEENT: EOMI,  MMM Lungs: Normal work of breathing. CTABL Heart: RRR no MRG Abd: NABS, Soft. Nondistended, Nontender Exts: Brisk capillary refill, warm and well perfused.  Psych: Alert and oriented normal speech normal thought process.  Affect is tearful.  No SI or HI  Depression screen [facility] 2/9 05/10/2017 03/14/2017 06/14/2016 05/19/2016 02/11/2016  Decreased Interest 2 1 0 2 3  Down, Depressed, Hopeless 2 1 0 2 3  PHQ - 2 Score 4 2 0 4 6  Altered sleeping 3 3 - 3 3  Tired, decreased energy 1 2 - 3 3  Change in appetite 1 2 - 2 3  Feeling bad or failure about yourself  1 2 - 2 3  Trouble concentrating 1 2 - 2 3  Moving slowly or fidgety/restless 1 2 - 1 3  Suicidal thoughts 0 0 - 0 0  PHQ-9 Score 12 15 - 17 24  Difficult doing work/chores Very difficult - - - Extremely dIfficult   GAD 7 : Generalized Anxiety Score 05/10/2017 05/19/2016 02/11/2016    Nervous, Anxious, on Edge 2 2 3   Control/stop worrying 2 3 3   Worry too much - different things 2 3 3   Trouble relaxing 3 3 3   Restless 3 2 3   Easily annoyed or irritable 3 3 3   Afraid - awful might happen 2 2 3   Total GAD 7 Score 17 18 21   Anxiety Difficulty Very difficult - Extremely difficult       No results found for this or any previous visit (from the past 72 hour(s)). No results found.    Assessment and Plan: 32 y.o. female with  HTN: Not controlled. Start HCTZ 25  and recheck in 1 month.   Grief: Still processing. Recommend Therapy with hospice or behavioral health.  She will let me know she wants a referral.  Major depression, anxiety disorder: Worsened.  Increase Zoloft to 100 mg and recheck in 1 month.  Diarrhea and cramping: Likely IBS.  Patient has a pertinent surgical history for this postcholecystectomy.  This could certainly be a factor.  Family history of Crohn's is concerning.  Plan for short trial of dicyclomine.  Recheck in 1 month.   No orders of the defined types were placed in this encounter.  Meds ordered this encounter  Medications  . sertraline (ZOLOFT) 100 MG tablet    Sig: Take 1 tablet (100 mg total) by mouth daily. Take 1 pill po daily x 1 week. Then increase to 2 pills po daily.    Dispense:  90 tablet    Refill:  1  . hydrochlorothiazide (HYDRODIURIL) 25 MG tablet    Sig: Take 1 tablet (25 mg total) by mouth daily.    Dispense:  30 tablet    Refill:  2  . dicyclomine (BENTYL) 10 MG capsule    Sig: Take 1 capsule (10 mg total) by mouth 4 (four) times daily -  before meals and at bedtime.    Dispense:  90 capsule    Refill:  1     Discussed warning signs or symptoms. Please see discharge instructions. Patient expresses understanding.

## 2017-05-10 NOTE — Patient Instructions (Signed)
Thank you for coming in today. 1) Start HCTZ for blood pressure.  2) Increase Zoloft to 100mg .  Reach for therapy at hospice or here. Let me know.  3) Try Dicyclomine 3x dialy with meals for cramping and diarrhea.   Recheck in 1 month.    Complicated Grieving Grief is a normal response to the death of someone close to you. Feelings of fear, anger, and guilt can affect almost everyone who loses a loved one. It is also common to have symptoms of depression while you are grieving. These include problems with sleep, loss of appetite, and lack of energy. They may last for weeks or months after a loss. Complicated grief is different from normal grief or depression. Normal grieving involves sadness and feelings of loss, but these feelings are not constant. Complicated grief is a constant and severe type of grief. It interferes with your ability to function normally. It may last for several months to a year or longer. Complicated grief may require treatment from a mental health care provider. What are the causes? It is not known why some people continue to struggle with grief and others do not. You may be at higher risk for complicated grief if:  The death of your loved one was sudden or unexpected.  The death of your loved one was due to a violent event.  Your loved one committed suicide.  Your loved one was a child or a young person.  You were very close to or dependent on the loved one.  You have a history of depression.  What are the signs or symptoms? Signs and symptoms of complicated grief may include:  Feeling disbelief or numbness.  Being unable to enjoy good memories of your loved one.  Needing to avoid anything that reminds you of your loved one.  Being unable to stop thinking about the death.  Feeling intense anger or guilt.  Feeling alone and hopeless.  Feeling that your life is meaningless and empty.  Losing the desire to live.  How is this diagnosed? Your health  care provider may diagnose complicated grief if:  You have constant symptoms of grief for 6-12 months or longer.  Your symptoms are interfering with your ability to live your life.  Your health care provider may want you to see a mental health care provider. Many symptoms of depression are similar to the symptoms of complicated grief. It is important to be evaluated for complicated grief along with other mental health conditions. How is this treated? Talk therapy with a mental health provider is the most common treatment for complicated grief. During therapy, you will learn healthy ways to cope with the loss of your loved one. In some cases, your mental health care provider may also recommend antidepressant medicines. Follow these instructions at home:  Take care of yourself. ? Eat regular meals and maintain a healthy diet. Eat plenty of fruits, vegetables, and whole grains. ? Try to get some exercise each day. ? Keep regular hours for sleep. Try to get at least 8 hours of sleep each night.  Do not use drugs or alcohol to ease your symptoms.  Take medicines only as directed by your health care provider.  Spend time with friends and loved ones.  Consider joining a grief (bereavement) support group to help you deal with your loss.  Keep all follow-up visits as directed by your health care provider. This is important. Contact a health care provider if:  Your symptoms keep you from functioning  normally.  Your symptoms do not get better with treatment. Get help right away if:  You have serious thoughts of hurting yourself or someone else.  You have suicidal feelings. This information is not intended to replace advice given to you by your health care provider. Make sure you discuss any questions you have with your health care provider. Document Released: 03/01/2005 Document Revised: 08/07/2015 Document Reviewed: 08/09/2013 Elsevier Interactive Patient Education  2018 [employer]  Irritable Bowel Syndrome, Adult Irritable bowel syndrome (IBS) is not one specific disease. It is a group of symptoms that affects the organs responsible for digestion (gastrointestinal or GI tract). To regulate how your GI tract works, your body sends signals back and forth between your intestines and your brain. If you have IBS, there may be a problem with these signals. As a result, your GI tract does not function normally. Your intestines may become more sensitive and overreact to certain things. This is especially true when you eat certain foods or when you are under stress. There are four types of IBS. These may be determined based on the consistency of your stool:  IBS with diarrhea.  IBS with constipation.  Mixed IBS.  Unsubtyped IBS.  It is important to know which type of IBS you have. Some treatments are more likely to be helpful for certain types of IBS. What are the causes? The exact cause of IBS is not known. What increases the risk? You may have a higher risk of IBS if:  You are a woman.  You are younger than 30 years old.  You have a family history of IBS.  You have mental health problems.  You have had bacterial infection of your GI tract.  What are the signs or symptoms? Symptoms of IBS vary from person to person. The main symptom is abdominal pain or discomfort. Additional symptoms usually include one or more of the following:  Diarrhea, constipation, or both.  Abdominal swelling or bloating.  Feeling full or sick after eating a small or regular-size meal.  Frequent gas.  Mucus in the stool.  A feeling of having more stool left after a bowel movement.  Symptoms tend to come and go. They may be associated with stress, psychiatric conditions, or nothing at all. How is this diagnosed? There is no specific test to diagnose IBS. Your health care provider will make a diagnosis based on a physical exam, medical history, and your symptoms. You may have  other tests to rule out other conditions that may be causing your symptoms. These may include:  Blood tests.  X-rays.  CT scan.  Endoscopy and colonoscopy. This is a test in which your GI tract is viewed with a long, thin, flexible tube.  How is this treated? There is no cure for IBS, but treatment can help relieve symptoms. IBS treatment often includes:  Changes to your diet, such as: ? Eating more fiber. ? Avoiding foods that cause symptoms. ? Drinking more water. ? Eating regular, medium-sized portioned meals.  Medicines. These may include: ? Fiber supplements if you have constipation. ? Medicine to control diarrhea (antidiarrheal medicines). ? Medicine to help control muscle spasms in your GI tract (antispasmodic medicines). ? Medicines to help with any mental health issues, such as antidepressants or tranquilizers.  Therapy. ? Talk therapy may help with anxiety, depression, or other mental health issues that can make IBS symptoms worse.  Stress reduction. ? Managing your stress can help keep symptoms under control.  Follow these instructions  at home:  Take medicines only as directed by your health care provider.  Eat a healthy diet. ? Avoid foods and drinks with added sugar. ? Include more whole grains, fruits, and vegetables gradually into your diet. This may be especially helpful if you have IBS with constipation. ? Avoid any foods and drinks that make your symptoms worse. These may include dairy products and caffeinated or carbonated drinks. ? Do not eat large meals. ? Drink enough fluid to keep your urine clear or pale yellow.  Exercise regularly. Ask your health care provider for recommendations of good activities for you.  Keep all follow-up visits as directed by your health care provider. This is important. Contact a health care provider if:  You have constant pain.  You have trouble or pain with swallowing.  You have worsening diarrhea. Get help right  away if:  You have severe and worsening abdominal pain.  You have diarrhea and: ? You have a rash, stiff neck, or severe headache. ? You are irritable, sleepy, or difficult to awaken. ? You are weak, dizzy, or extremely thirsty.  You have bright red blood in your stool or you have black tarry stools.  You have unusual abdominal swelling that is painful.  You vomit continuously.  You vomit blood (hematemesis).  You have both abdominal pain and a fever. This information is not intended to replace advice given to you by your health care provider. Make sure you discuss any questions you have with your health care provider. Document Released: 03/01/2005 Document Revised: 08/01/2015 Document Reviewed: 11/16/2013 Elsevier Interactive Patient Education  2018 [employer].

## 2017-06-07 ENCOUNTER — Ambulatory Visit (INDEPENDENT_AMBULATORY_CARE_PROVIDER_SITE_OTHER): Payer: BLUE CROSS/BLUE SHIELD | Admitting: Family Medicine

## 2017-06-07 DIAGNOSIS — Z5329 Procedure and treatment not carried out because of patient's decision for other reasons: Secondary | ICD-10-CM

## 2017-06-07 DIAGNOSIS — Z0189 Encounter for other specified special examinations: Secondary | ICD-10-CM

## 2017-06-07 NOTE — Progress Notes (Signed)
No charge late visit due to [facility]

## 2017-07-18 ENCOUNTER — Encounter: Payer: Self-pay | Admitting: Family Medicine

## 2017-07-18 ENCOUNTER — Ambulatory Visit (INDEPENDENT_AMBULATORY_CARE_PROVIDER_SITE_OTHER): Payer: BLUE CROSS/BLUE SHIELD | Admitting: Family Medicine

## 2017-07-18 VITALS — BP 116/74 | HR 97 | Wt 288.0 lb

## 2017-07-18 DIAGNOSIS — S39012A Strain of muscle, fascia and tendon of lower back, initial encounter: Secondary | ICD-10-CM

## 2017-07-18 MED ORDER — HYDROCODONE-ACETAMINOPHEN 5-325 MG PO TABS: 1.0000 | ORAL_TABLET | Freq: Four times a day (QID) | ORAL | 0 refills | Status: DC | PRN

## 2017-07-18 MED ORDER — CYCLOBENZAPRINE HCL 10 MG PO TABS: 10.0000 mg | ORAL_TABLET | Freq: Three times a day (TID) | ORAL | 1 refills | Status: DC

## 2017-07-18 NOTE — Patient Instructions (Signed)
Thank you for coming in today. Use a heating pad, TENS unit.  Try to stay active.  If not getting better PT is next.  Come back or go to the emergency room if you notice new weakness new numbness problems walking or bowel or bladder problems.   Lumbosacral Strain Lumbosacral strain is an injury that causes pain in the lower back (lumbosacral spine). This injury usually occurs from overstretching the muscles or ligaments along your spine. A strain can affect one or more muscles or cord-like tissues that connect bones to other bones (ligaments). What are the causes? This condition may be caused by:  A hard, direct hit (blow) to the back.  Excessive stretching of the lower back muscles. This may result from: ? A fall. ? Lifting something heavy. ? Repetitive movements such as bending or crouching.  What increases the risk? The following factors may increase your risk of getting this condition:  Participating in sports or activities that involve: ? A sudden twist of the back. ? Pushing or pulling motions.  Being overweight or obese.  Having poor strength and flexibility, especially tight hamstrings or weak muscles in the back or abdomen.  Having too much of a curve in the lower back.  Having a pelvis that is tilted forward.  What are the signs or symptoms? The main symptom of this condition is pain in the lower back, at the site of the strain. Pain may extend (radiate) down one or both legs. How is this diagnosed? This condition is diagnosed based on:  Your symptoms.  Your medical history.  A physical exam. ? Your health care provider may push on certain areas of your back to determine the source of your pain. ? You may be asked to bend forward, backward, and side to side to assess the severity of your pain and your range of motion.  Imaging tests, such as: ? X-rays. ? MRI.  How is this treated? Treatment for this condition may include:  Putting heat and cold on the  affected area.  Medicines to help relieve pain and relax your muscles (muscle relaxants).  NSAIDs to help reduce swelling and discomfort.  When your symptoms improve, it is important to gradually return to your normal routine as soon as possible to reduce pain, avoid stiffness, and avoid loss of muscle strength. Generally, symptoms should improve within 6 weeks of treatment. However, recovery time varies. Follow these instructions at home: Managing pain, stiffness, and swelling   If directed, put ice on the injured area during the first 24 hours after your strain. ? Put ice in a plastic bag. ? Place a towel between your skin and the bag. ? Leave the ice on for 20 minutes, 2-3 times a day.  If directed, put heat on the affected area as often as told by your health care provider. Use the heat source that your health care provider recommends, such as a moist heat pack or a heating pad. ? Place a towel between your skin and the heat source. ? Leave the heat on for 20-30 minutes. ? Remove the heat if your skin turns bright red. This is especially important if you are unable to feel pain, heat, or cold. You may have a greater risk of getting burned. Activity  Rest and return to your normal activities as told by your health care provider. Ask your health care provider what activities are safe for you.  Avoid activities that take a lot of energy for as long as  told by your health care provider. General instructions  Take over-the-counter and prescription medicines only as told by your health care provider.  Donot drive or use heavy machinery while taking prescription pain medicine.  Do not use any products that contain nicotine or tobacco, such as cigarettes and e-cigarettes. If you need help quitting, ask your health care provider.  Keep all follow-up visits as told by your health care provider. This is important. How is this prevented?  Use correct form when playing sports and lifting  heavy objects.  Use good posture when sitting and standing.  Maintain a healthy weight.  Sleep on a mattress with medium firmness to support your back.  Be safe and responsible while being active to avoid falls.  Do at least 150 minutes of moderate-intensity exercise each week, such as brisk walking or water aerobics. Try a form of exercise that takes stress off your back, such as swimming or stationary cycling.  Maintain physical fitness, including: ? Strength. ? Flexibility. ? Cardiovascular fitness. ? Endurance. Contact a health care provider if:  Your back pain does not improve after 6 weeks of treatment.  Your symptoms get worse. Get help right away if:  Your back pain is severe.  You cannot stand or walk.  You have difficulty controlling when you urinate or when you have a bowel movement.  You feel nauseous or you vomit.  Your feet get very cold.  You have numbness, tingling, weakness, or problems using your arms or legs.  You develop any of the following: ? Shortness of breath. ? Dizziness. ? Pain in your legs. ? Weakness in your buttocks or legs. ? Discoloration of the skin on your toes or legs. This information is not intended to replace advice given to you by your health care provider. Make sure you discuss any questions you have with your health care provider. Document Released: 12/09/2004 Document Revised: 09/19/2015 Document Reviewed: 08/03/2015 Elsevier Interactive Patient Education  [employer].

## 2017-07-19 NOTE — Progress Notes (Signed)
Ann Brock is a 32 y.o. female who presents to [facility] today for back pain.  Ann Brock notes acute onset of right low back pain starting today.  She notes the pain is quite severe and worse when she stands from a seated position.  She denies any radiating pain weakness or numbness fevers or chills.  Pain occurred when she was lifting an object at work.  She denies any bowel or bladder dysfunction fevers or chills.  She has not tried any treatment yet.   Past Medical History:  Diagnosis Date  . Chest pain    2014  . Ectopic pregnancy    treated with methotrexate  . Hyperemesis gravidarum   . Hypertension    prior to preg, no meds  . Infection    UTI  . Vaginal Pap smear, abnormal    repeat was normal   Past Surgical History:  Procedure Laterality Date  . CESAREAN SECTION    . CESAREAN SECTION N/A 08/05/2015   Procedure: REPEAT CESAREAN SECTION ;  Surgeon: Donnamae Jude, MD;  Location: [facility];  Service: Obstetrics;  Laterality: N/A;  . CHOLECYSTECTOMY    . KNEE SURGERY    . TONSILLECTOMY AND ADENOIDECTOMY     Social History   Tobacco Use  . Smoking status: Former Research scientist (life sciences)  . Smokeless tobacco: Never Used  . Tobacco comment: quit  2006  Substance Use Topics  . Alcohol use: No    Alcohol/week: 0.0 oz    Comment: socially prior to pregnancy     ROS:  As above   Medications: Current Outpatient Medications  Medication Sig Dispense Refill  . acetaminophen (TYLENOL) 325 MG tablet Take 650 mg by mouth every 6 (six) hours as needed for headache.     Marland Kitchen BREWERS YEAST PO Take by mouth.    . cyclobenzaprine (FLEXERIL) 10 MG tablet Take 1 tablet (10 mg total) by mouth 3 (three) times daily. 20 tablet 1  . diclofenac sodium (VOLTAREN) 1 % GEL Apply 4 g topically 4 (four) times daily. To affected joint. 100 g 11  . dicyclomine (BENTYL) 10 MG capsule Take 1 capsule (10 mg total) by mouth 4 (four) times daily -  before  meals and at bedtime. 90 capsule 1  . hydrochlorothiazide (HYDRODIURIL) 25 MG tablet Take 1 tablet (25 mg total) by mouth daily. 30 tablet 2  . metoCLOPramide (REGLAN) 5 MG/5ML solution Take 10 mLs (10 mg total) by mouth 4 (four) times daily -  before meals and at bedtime. 120 mL 0  . Misc. Devices MISC Disp 1 hospital grade breast pump to use.  EDC 08/05/15 1 each 0  . Prenatal Vit-Fe Fumarate-FA (MULTIVITAMIN-PRENATAL) 27-0.8 MG TABS tablet Take 1 tablet by mouth daily at 12 noon.    . sertraline (ZOLOFT) 100 MG tablet Take 1 tablet (100 mg total) by mouth daily. Take 1 pill po daily x 1 week. Then increase to 2 pills po daily. 90 tablet 1  . traZODone (DESYREL) 50 MG tablet Take 0.5-1 tablets (25-50 mg total) by mouth at bedtime as needed for sleep. 30 tablet 3  . HYDROcodone-acetaminophen (NORCO/VICODIN) 5-325 MG tablet Take 1 tablet by mouth every 6 (six) hours as needed. 10 tablet 0   No current facility-administered medications for this visit.    Allergies  Allergen Reactions  . Aspirin Anaphylaxis  . Cephalosporins Anaphylaxis  . Contrast Media  [Iodinated Diagnostic Agents] Anaphylaxis     Exam:  BP 116/74  Pulse 97   Wt 288 lb (130.6 kg)   BMI 46.48 kg/m  General: Well Developed, well nourished, and in no acute distress.  Neuro/Psych: Alert and oriented x3, extra-ocular muscles intact, able to move all 4 extremities, sensation grossly intact. Skin: Warm and dry, no rashes noted.  Respiratory: Not using accessory muscles, speaking in full sentences, trachea midline.  Cardiovascular: Pulses palpable, no extremity edema. Abdomen: Does not appear distended. MSK:  L-spine: Nontender to midline.  Tender to palpation right lumbar paraspinal muscle group. Decreased lumbar motion due to pain. Negative slump test bilaterally. Extremity strength is intact. Mild antalgic gait present.   CT scan of the abdomen and pelvis from 2017 reviewed for lumbar spine  abnormalities.     Assessment and Plan: 32 y.o. female with lumbosacral strain.  Plan to treat with NSAIDs, Flexeril, limited Norco, heating pad, TENS unit.  If not improving next step would be physical therapy.    No orders of the defined types were placed in this encounter.  Meds ordered this encounter  Medications  . cyclobenzaprine (FLEXERIL) 10 MG tablet    Sig: Take 1 tablet (10 mg total) by mouth 3 (three) times daily.    Dispense:  20 tablet    Refill:  1  . HYDROcodone-acetaminophen (NORCO/VICODIN) 5-325 MG tablet    Sig: Take 1 tablet by mouth every 6 (six) hours as needed.    Dispense:  10 tablet    Refill:  0    Discussed warning signs or symptoms. Please see discharge instructions. Patient expresses understanding.  Patient researched [facility] Controlled Substance Reporting System.

## 2017-08-16 ENCOUNTER — Other Ambulatory Visit: Payer: Self-pay

## 2017-08-16 ENCOUNTER — Inpatient Hospital Stay (HOSPITAL_COMMUNITY)
Admission: AD | Admit: 2017-08-16 | Discharge: 2017-08-16 | Disposition: A | Discharge status: Home / Self Care | Payer: BLUE CROSS/BLUE SHIELD | Source: Ambulatory Visit | Attending: Family Medicine | Admitting: Family Medicine

## 2017-08-16 ENCOUNTER — Encounter: Payer: Self-pay | Admitting: Obstetrics & Gynecology

## 2017-08-16 ENCOUNTER — Inpatient Hospital Stay (HOSPITAL_COMMUNITY): Payer: BLUE CROSS/BLUE SHIELD

## 2017-08-16 ENCOUNTER — Encounter (HOSPITAL_COMMUNITY): Payer: Self-pay

## 2017-08-16 ENCOUNTER — Ambulatory Visit: Payer: BLUE CROSS/BLUE SHIELD | Admitting: Obstetrics & Gynecology

## 2017-08-16 VITALS — BP 137/86 | HR 101 | Temp 98.3°F | Resp 16 | Ht 66.0 in | Wt 284.0 lb

## 2017-08-16 DIAGNOSIS — Z886 Allergy status to analgesic agent status: Secondary | ICD-10-CM | POA: Insufficient documentation

## 2017-08-16 DIAGNOSIS — R102 Pelvic and perineal pain: Secondary | ICD-10-CM

## 2017-08-16 DIAGNOSIS — N939 Abnormal uterine and vaginal bleeding, unspecified: Secondary | ICD-10-CM

## 2017-08-16 DIAGNOSIS — I10 Essential (primary) hypertension: Secondary | ICD-10-CM | POA: Insufficient documentation

## 2017-08-16 DIAGNOSIS — Z87891 Personal history of nicotine dependence: Secondary | ICD-10-CM | POA: Insufficient documentation

## 2017-08-16 DIAGNOSIS — R109 Unspecified abdominal pain: Secondary | ICD-10-CM | POA: Diagnosis present

## 2017-08-16 DIAGNOSIS — R1030 Lower abdominal pain, unspecified: Secondary | ICD-10-CM

## 2017-08-16 DIAGNOSIS — Z881 Allergy status to other antibiotic agents status: Secondary | ICD-10-CM | POA: Diagnosis not present

## 2017-08-16 DIAGNOSIS — Z79899 Other long term (current) drug therapy: Secondary | ICD-10-CM | POA: Insufficient documentation

## 2017-08-16 DIAGNOSIS — Z975 Presence of (intrauterine) contraceptive device: Secondary | ICD-10-CM | POA: Diagnosis not present

## 2017-08-16 DIAGNOSIS — Z3202 Encounter for pregnancy test, result negative: Secondary | ICD-10-CM

## 2017-08-16 DIAGNOSIS — M545 Low back pain, unspecified: Secondary | ICD-10-CM

## 2017-08-16 LAB — CBC WITH DIFFERENTIAL/PLATELET
BASOS ABS: 0 10*3/uL (ref 0.0–0.1)
Basophils Relative: 0 %
EOS PCT: 1 %
Eosinophils Absolute: 0.1 10*3/uL (ref 0.0–0.7)
HEMATOCRIT: 39.7 % (ref 36.0–46.0)
Hemoglobin: 13.4 g/dL (ref 12.0–15.0)
LYMPHS PCT: 21 %
Lymphs Abs: 2.1 10*3/uL (ref 0.7–4.0)
MCH: 27.9 pg (ref 26.0–34.0)
MCHC: 33.8 g/dL (ref 30.0–36.0)
MCV: 82.5 fL (ref 78.0–100.0)
Monocytes Absolute: 0.4 10*3/uL (ref 0.1–1.0)
Monocytes Relative: 4 %
NEUTROS ABS: 7.5 10*3/uL (ref 1.7–7.7)
Neutrophils Relative %: 74 %
PLATELETS: 226 10*3/uL (ref 150–400)
RBC: 4.81 MIL/uL (ref 3.87–5.11)
RDW: 13.6 % (ref 11.5–15.5)
WBC: 10.1 10*3/uL (ref 4.0–10.5)

## 2017-08-16 LAB — POCT URINALYSIS DIPSTICK
BILIRUBIN UA: NEGATIVE
Blood, UA: NEGATIVE
Glucose, UA: NEGATIVE
KETONES UA: NEGATIVE
Leukocytes, UA: NEGATIVE
Nitrite, UA: NEGATIVE
PROTEIN UA: NEGATIVE
Spec Grav, UA: >=1.03 — AB (ref 1.010–1.025)
Urobilinogen, UA: NEGATIVE E.U./dL — AB
pH, UA: 5 (ref 5.0–8.0)

## 2017-08-16 LAB — POCT URINE PREGNANCY: Preg Test, Ur: NEGATIVE

## 2017-08-16 MED ORDER — KETOROLAC TROMETHAMINE 10 MG PO TABS: 10.0000 mg | ORAL_TABLET | Freq: Four times a day (QID) | ORAL | 0 refills | Status: DC | PRN

## 2017-08-16 MED ORDER — KETOROLAC TROMETHAMINE 60 MG/2ML IM SOLN
60.0000 mg | Freq: Once | INTRAMUSCULAR | Status: AC
  Administered 2017-08-16: 60 mg via INTRAMUSCULAR
  Filled 2017-08-16: qty 2

## 2017-08-16 NOTE — Progress Notes (Signed)
   Subjective:    Patient ID: Ann Brock, female    DOB: Oct 03, 1985, 32 y.o.   MRN: [id number]  HPI  32 yo female presents c/o severe 9/10 pain constnat stabbing pain in RLQ since Saturday that occurred durin intercourse.  Then started bleeding--heavy--waring pad.  Pt usually doesn't have menses normally on Mirena.  Pain has not gotten better over 3 days.  No N/V/D.  No fever.  No dysuria  Review of Systems  Constitutional: Positive for appetite change and fever.  Respiratory: Negative.   Cardiovascular: Negative.   Gastrointestinal: Positive for abdominal pain and constipation. Negative for diarrhea and nausea.  Genitourinary: Positive for flank pain and pelvic pain.  Psychiatric/Behavioral: Negative.        Objective:   Physical Exam  Constitutional: She is oriented to person, place, and time. She appears well-developed and well-nourished. No distress.  HENT:  Head: Normocephalic and atraumatic.  Eyes: Conjunctivae are normal.  Cardiovascular: Normal rate.  Pulmonary/Chest: Effort normal.  Abdominal: Soft. Bowel sounds are normal. She exhibits no distension and no mass. There is tenderness. There is guarding. There is no rebound.  Genitourinary:  Genitourinary Comments: Tanner V , no lesion Vagina--no blood, nml discharge Cervix--No CMT-Strings about 4 cm Uterus and ovaries  Musculoskeletal: She exhibits no edema.  Neurological: She is alert and oriented to person, place, and time.  Skin: Skin is warm and dry.  Psychiatric: She has a normal mood and affect.  Vitals reviewed.  Vitals:   08/16/17 1533  BP: 137/86  Pulse: (!) 101  Resp: 16  Weight: 284 lb (128.8 kg)  Height: 5\' 6"  (1.676 m)    Assessment & Plan:  32 yo female with acute onset right lower quadrant pain Saturday.  Pain is constant.    1-UA/U Cx 2.  Send to MAU for [place] and further imaging studies as needed. 3.  RTC prn.

## 2017-08-16 NOTE — MAU Note (Signed)
Went in to dr's office because she was having lower abd pain.  Had intercourse on Sat, started bleeding.  Was sent over here to get [place] or CT scan for further eval of abd and back pain.

## 2017-08-16 NOTE — MAU Provider Note (Signed)
Chief Complaint: Abdominal Pain and Back Pain   First Provider Initiated Contact with Patient 08/16/17 2159      SUBJECTIVE HPI: Ann Brock is a 32 y.o. [id number] not currently pregnant who presents to maternity admissions sent from the office for abdominal pain, back pain and vaginal bleeding. She reports the lower abdominal pain started this past Saturday after intercourse. She describes her abdominal pain as sharp pain specific to her lower right quadrant, rates pain 6/10- has taken Tylenol and ibuprofen for pain with little relief. She reports associated symptoms with abdominal pain include back pain. She reports vaginal bleeding started after intercourse as well, has been having to wear panty liners but no pads. She denies checking for her IUD strings. Was sent from office for further evaluation and [place].    Past Medical History:  Diagnosis Date  . Chest pain    2014  . Ectopic pregnancy    treated with methotrexate  . Hyperemesis gravidarum   . Hypertension    prior to preg, no meds  . Infection    UTI  . Vaginal Pap smear, abnormal    repeat was normal   Past Surgical History:  Procedure Laterality Date  . CESAREAN SECTION    . CESAREAN SECTION N/A 08/05/2015   Procedure: REPEAT CESAREAN SECTION ;  Surgeon: Donnamae Jude, MD;  Location: [facility];  Service: Obstetrics;  Laterality: N/A;  . CHOLECYSTECTOMY    . KNEE SURGERY    . TONSILLECTOMY AND ADENOIDECTOMY     Social History   Socioeconomic History  . Marital status: Married    Spouse name: Not on file  . Number of children: Not on file  . Years of education: Not on file  . Highest education level: Not on file  Occupational History  . Occupation: sheets  Social Needs  . Financial resource strain: Not on file  . Food insecurity:    Worry: Not on file    Inability: Not on file  . Transportation needs:    Medical: Not on file    Non-medical: Not on file  Tobacco Use  . Smoking status: Former Research scientist (life sciences)   . Smokeless tobacco: Never Used  . Tobacco comment: quit  2006  Substance and Sexual Activity  . Alcohol use: No    Alcohol/week: 0.0 oz    Comment: socially prior to pregnancy  . Drug use: No  . Sexual activity: Yes    Partners: Male    Birth control/protection: IUD  Lifestyle  . Physical activity:    Days per week: Not on file    Minutes per session: Not on file  . Stress: Not on file  Relationships  . Social connections:    Talks on phone: Not on file    Gets together: Not on file    Attends religious service: Not on file    Active member of club or organization: Not on file    Attends meetings of clubs or organizations: Not on file    Relationship status: Not on file  . Intimate partner violence:    Fear of current or ex partner: Not on file    Emotionally abused: Not on file    Physically abused: Not on file    Forced sexual activity: Not on file  Other Topics Concern  . Not on file  Social History Narrative  . Not on file   No current facility-administered medications on file prior to encounter.    Current Outpatient Medications on File  Prior to Encounter  Medication Sig Dispense Refill  . acetaminophen (TYLENOL) 325 MG tablet Take 650 mg by mouth every 6 (six) hours as needed for headache.     . hydrochlorothiazide (HYDRODIURIL) 25 MG tablet Take 1 tablet (25 mg total) by mouth daily. 30 tablet 2  . sertraline (ZOLOFT) 100 MG tablet Take 1 tablet (100 mg total) by mouth daily. Take 1 pill po daily x 1 week. Then increase to 2 pills po daily. 90 tablet 1   Allergies  Allergen Reactions  . Aspirin Anaphylaxis  . Cephalosporins Anaphylaxis  . Contrast Media  [Iodinated Diagnostic Agents] Anaphylaxis    ROS:  Review of Systems  Respiratory: Negative.   Cardiovascular: Negative.   Gastrointestinal: Positive for abdominal pain. Negative for constipation, diarrhea, nausea and vomiting.  Genitourinary: Positive for vaginal bleeding. Negative for difficulty  urinating, dysuria, flank pain, frequency, hematuria, urgency and vaginal pain.  Musculoskeletal: Positive for back pain.   I have reviewed patient's Past Medical Hx, Surgical Hx, Family Hx, Social Hx, medications and allergies.   Physical Exam   Vitals:   08/16/17 1806 08/16/17 2303  BP: 133/86 119/64  Pulse: 99 78  Resp: 16   Temp: 98.7 F (37.1 C)   TempSrc: Oral   SpO2: 100%   Weight: 258 lb 8 oz (117.3 kg)    Constitutional: Well-developed, well-nourished female in no acute distress.  Cardiovascular: normal rate Respiratory: normal effort GI: Abd soft, non-tender. Pos BS x 4 Neurologic: Alert and oriented x 4.   PELVIC EXAM: completed in office   LAB RESULTS Results for orders placed or performed during the hospital encounter of 08/16/17 (from the past 24 hour(s))  CBC with Differential/Platelet     Status: None   Collection Time: 08/16/17  5:58 PM  Result Value Ref Range   WBC 10.1 4.0 - 10.5 K/uL   RBC 4.81 3.87 - 5.11 MIL/uL   Hemoglobin 13.4 12.0 - 15.0 g/dL   HCT 39.7 36.0 - 46.0 %   MCV 82.5 78.0 - 100.0 fL   MCH 27.9 26.0 - 34.0 pg   MCHC 33.8 30.0 - 36.0 g/dL   RDW 13.6 11.5 - 15.5 %   Platelets 226 150 - 400 K/uL   Neutrophils Relative % 74 %   Neutro Abs 7.5 1.7 - 7.7 K/uL   Lymphocytes Relative 21 %   Lymphs Abs 2.1 0.7 - 4.0 K/uL   Monocytes Relative 4 %   Monocytes Absolute 0.4 0.1 - 1.0 K/uL   Eosinophils Relative 1 %   Eosinophils Absolute 0.1 0.0 - 0.7 K/uL   Basophils Relative 0 %   Basophils Absolute 0.0 0.0 - 0.1 K/uL   IMAGING US Pelvis Transvanginal Non-ob (tv Only)  Result Date: 08/16/2017 CLINICAL DATA:  Acute pelvic pain.  History of ectopic pregnancy. EXAM: TRANSABDOMINAL AND TRANSVAGINAL ULTRASOUND OF PELVIS DOPPLER ULTRASOUND OF OVARIES TECHNIQUE: Both transabdominal and transvaginal ultrasound examinations of the pelvis were performed. Transabdominal technique was performed for global imaging of the pelvis including uterus,  ovaries, adnexal regions, and pelvic cul-de-sac. It was necessary to proceed with endovaginal exam following the transabdominal exam to visualize the endometrium in the exam. Color and duplex Doppler ultrasound was utilized to evaluate blood flow to the ovaries. COMPARISON:  Body CT 02/03/2016 FINDINGS: Uterus Measurements: 8.6 x 5.6 x 6.7 cm. The uterus is retroverted. No fibroids or other mass visualized. Endometrium Thickness: 10 mm. IUD in place. Small amount of fluid within the endometrial canal. Right ovary Measurements:  3.5 x 2.5 x 2.0 cm. Normal appearance/no adnexal mass. Left ovary Measurements: 2.4 x 1.7 x 2.5 cm. Normal appearance/no adnexal mass. Pulsed Doppler evaluation of both ovaries demonstrates normal low-resistance arterial and venous waveforms. Other findings No abnormal free fluid. IMPRESSION: Retroverted uterus with IUD in place. Small amount of fluid within the endometrial canal. Normal appearance of the ovaries. Electronically Signed   By: Fidela Salisbury M.D.   On: 08/16/2017 21:20   US Pelvis (transabdominal Only)  Result Date: 08/16/2017 CLINICAL DATA:  Acute pelvic pain.  History of ectopic pregnancy. EXAM: TRANSABDOMINAL AND TRANSVAGINAL ULTRASOUND OF PELVIS DOPPLER ULTRASOUND OF OVARIES TECHNIQUE: Both transabdominal and transvaginal ultrasound examinations of the pelvis were performed. Transabdominal technique was performed for global imaging of the pelvis including uterus, ovaries, adnexal regions, and pelvic cul-de-sac. It was necessary to proceed with endovaginal exam following the transabdominal exam to visualize the endometrium in the exam. Color and duplex Doppler ultrasound was utilized to evaluate blood flow to the ovaries. COMPARISON:  Body CT 02/03/2016 FINDINGS: Uterus Measurements: 8.6 x 5.6 x 6.7 cm. The uterus is retroverted. No fibroids or other mass visualized. Endometrium Thickness: 10 mm. IUD in place. Small amount of fluid within the endometrial canal. Right  ovary Measurements: 3.5 x 2.5 x 2.0 cm. Normal appearance/no adnexal mass. Left ovary Measurements: 2.4 x 1.7 x 2.5 cm. Normal appearance/no adnexal mass. Pulsed Doppler evaluation of both ovaries demonstrates normal low-resistance arterial and venous waveforms. Other findings No abnormal free fluid. IMPRESSION: Retroverted uterus with IUD in place. Small amount of fluid within the endometrial canal. Normal appearance of the ovaries. Electronically Signed   By: Fidela Salisbury M.D.   On: 08/16/2017 21:20   US Pelvic Doppler (torsion R/o Or Mass Arterial Flow)  Result Date: 08/16/2017 CLINICAL DATA:  Acute pelvic pain.  History of ectopic pregnancy. EXAM: TRANSABDOMINAL AND TRANSVAGINAL ULTRASOUND OF PELVIS DOPPLER ULTRASOUND OF OVARIES TECHNIQUE: Both transabdominal and transvaginal ultrasound examinations of the pelvis were performed. Transabdominal technique was performed for global imaging of the pelvis including uterus, ovaries, adnexal regions, and pelvic cul-de-sac. It was necessary to proceed with endovaginal exam following the transabdominal exam to visualize the endometrium in the exam. Color and duplex Doppler ultrasound was utilized to evaluate blood flow to the ovaries. COMPARISON:  Body CT 02/03/2016 FINDINGS: Uterus Measurements: 8.6 x 5.6 x 6.7 cm. The uterus is retroverted. No fibroids or other mass visualized. Endometrium Thickness: 10 mm. IUD in place. Small amount of fluid within the endometrial canal. Right ovary Measurements: 3.5 x 2.5 x 2.0 cm. Normal appearance/no adnexal mass. Left ovary Measurements: 2.4 x 1.7 x 2.5 cm. Normal appearance/no adnexal mass. Pulsed Doppler evaluation of both ovaries demonstrates normal low-resistance arterial and venous waveforms. Other findings No abnormal free fluid. IMPRESSION: Retroverted uterus with IUD in place. Small amount of fluid within the endometrial canal. Normal appearance of the ovaries. Electronically Signed   By: Fidela Salisbury M.D.    On: 08/16/2017 21:20    MAU Management/MDM: Orders Placed This Encounter  Procedures  . US PELVIS (TRANSABDOMINAL ONLY)  . US PELVIS TRANSVANGINAL NON-OB (TV ONLY)  . US PELVIC DOPPLER (TORSION R/O OR MASS ARTERIAL FLOW)  . CBC with Differential/Platelet  . Diet NPO time specified  . Discharge patient Discharge disposition: 01-Home or Self Care; Discharge patient date: 08/16/2017   [place]- negative, no signs of abnormalities  CBC w/diff- normal   Meds ordered this encounter  Medications  . ketorolac (TORADOL) injection 60 mg  . ketorolac (  TORADOL) 10 MG tablet    Sig: Take 1 tablet (10 mg total) by mouth every 6 (six) hours as needed.    Dispense:  15 tablet    Refill:  0    Order Specific Question:   Supervising Provider    Answer:   Rip Harbour, MICHAEL L [[id number]]   Treatments in MAU included 60mg  IM Toradol- patient reports taking Ibuprofen with no reaction with allergy to aspirin. Pt reports decrease of pain to 1/10 with medication- Rx sent for home use. Discussed results of [place] and lab work. Follow up in the office for worsening symptoms . Pt discharge.  ASSESSMENT 1. IUD (intrauterine device) in place   2. Acute pelvic pain, female   3. Lower abdominal pain   4. Acute bilateral low back pain without sciatica     PLAN Discharge home Follow up as scheduled in the office  Return to PCP or urgent care for worsening symptoms  Rx for Toradol   Follow-up Information    Guss Bunde, MD Follow up.   Specialty:  Obstetrics and Gynecology Why:  Follow up as scheduled for GYN care  Contact information: [place] [address] [phone]           Allergies as of 08/16/2017      Reactions   Aspirin Anaphylaxis   Cephalosporins Anaphylaxis   Contrast Media  [iodinated Diagnostic Agents] Anaphylaxis      Medication List    TAKE these medications   acetaminophen 325 MG tablet Commonly known as:  TYLENOL Take 650 mg by mouth every 6 (six) hours as  needed for headache.   hydrochlorothiazide 25 MG tablet Commonly known as:  HYDRODIURIL Take 1 tablet (25 mg total) by mouth daily.   ketorolac 10 MG tablet Commonly known as:  TORADOL Take 1 tablet (10 mg total) by mouth every 6 (six) hours as needed.   sertraline 100 MG tablet Commonly known as:  ZOLOFT Take 1 tablet (100 mg total) by mouth daily. Take 1 pill po daily x 1 week. Then increase to 2 pills po daily.        Darrol Poke  Certified Nurse-Midwife 08/18/2017  10:56 PM

## 2017-08-16 NOTE — MAU Note (Signed)
Urine in lab 

## 2017-08-17 LAB — CERVICOVAGINAL ANCILLARY ONLY
CHLAMYDIA, DNA PROBE: NEGATIVE
NEISSERIA GONORRHEA: NEGATIVE

## 2017-08-18 LAB — CULTURE, URINE COMPREHENSIVE
MICRO NUMBER: 90671645
SPECIMEN QUALITY:: ADEQUATE

## 2017-12-01 ENCOUNTER — Encounter: Payer: Self-pay | Admitting: Family Medicine

## 2017-12-19 ENCOUNTER — Ambulatory Visit (INDEPENDENT_AMBULATORY_CARE_PROVIDER_SITE_OTHER): Payer: BLUE CROSS/BLUE SHIELD | Admitting: Family Medicine

## 2017-12-19 ENCOUNTER — Encounter: Payer: Self-pay | Admitting: Family Medicine

## 2017-12-19 VITALS — BP 138/78 | HR 96 | Temp 98.4°F | Ht 66.0 in | Wt 292.0 lb

## 2017-12-19 DIAGNOSIS — J020 Streptococcal pharyngitis: Secondary | ICD-10-CM | POA: Diagnosis not present

## 2017-12-19 DIAGNOSIS — J040 Acute laryngitis: Secondary | ICD-10-CM

## 2017-12-19 LAB — POCT RAPID STREP A (OFFICE): RAPID STREP A SCREEN: POSITIVE — AB

## 2017-12-19 MED ORDER — AZITHROMYCIN 250 MG PO TABS: ORAL_TABLET | ORAL | 0 refills | Status: AC

## 2017-12-19 MED ORDER — AZITHROMYCIN 250 MG PO TABS: ORAL_TABLET | ORAL | 0 refills | Status: DC

## 2017-12-19 MED ORDER — BENZONATATE 200 MG PO CAPS: 200.0000 mg | ORAL_CAPSULE | Freq: Two times a day (BID) | ORAL | 0 refills | Status: DC | PRN

## 2017-12-19 NOTE — Patient Instructions (Signed)
Upper Respiratory Infection, Adult Most upper respiratory infections (URIs) are caused by a virus. A URI affects the nose, throat, and upper air passages. The most common type of URI is often called "the common cold." Follow these instructions at home:  Take medicines only as told by your doctor.  Gargle warm saltwater or take cough drops to comfort your throat as told by your doctor.  Use a warm mist humidifier or inhale steam from a shower to increase air moisture. This may make it easier to breathe.  Drink enough fluid to keep your pee (urine) clear or pale yellow.  Eat soups and other clear broths.  Have a healthy diet.  Rest as needed.  Go back to work when your fever is gone or your doctor says it is okay. ? You may need to stay home longer to avoid giving your URI to others. ? You can also wear a face mask and wash your hands often to prevent spread of the virus.  Use your inhaler more if you have asthma.  Do not use any tobacco products, including cigarettes, chewing tobacco, or electronic cigarettes. If you need help quitting, ask your doctor. Contact a doctor if:  You are getting worse, not better.  Your symptoms are not helped by medicine.  You have chills.  You are getting more short of breath.  You have brown or red mucus.  You have yellow or brown discharge from your nose.  You have pain in your face, especially when you bend forward.  You have a fever.  You have puffy (swollen) neck glands.  You have pain while swallowing.  You have white areas in the back of your throat. Get help right away if:  You have very bad or constant: ? Headache. ? Ear pain. ? Pain in your forehead, behind your eyes, and over your cheekbones (sinus pain). ? Chest pain.  You have long-lasting (chronic) lung disease and any of the following: ? Wheezing. ? Long-lasting cough. ? Coughing up blood. ? A change in your usual mucus.  You have a stiff neck.  You have  changes in your: ? Vision. ? Hearing. ? Thinking. ? Mood. This information is not intended to replace advice given to you by your health care provider. Make sure you discuss any questions you have with your health care provider. Document Released: 08/18/2007 Document Revised: 11/02/2015 Document Reviewed: 06/06/2013 Elsevier Interactive Patient Education  2018 [employer] Inc[employer]  

## 2017-12-19 NOTE — Progress Notes (Signed)
   Subjective:    Patient ID: Ann Brock, female    DOB: August 14, 1985, 32 y.o.   MRN: [id number]  HPI 2 days started coughing and now has lost her voice. Using Robitussin and chloroseptic spray.  She says her throat hurts and it is painful to swallow.  She is having some postnasal drip but no significant nasal congestion.  No ear pain.  She is had some chills and sweats but when she is actually checked her temperature with a thermometer it has been normal.   Review of Systems     Objective:   Physical Exam  Constitutional: She is oriented to person, place, and time. She appears well-developed and well-nourished.  HENT:  Head: Normocephalic and atraumatic.  Right Ear: External ear normal.  Left Ear: External ear normal.  Nose: Nose normal.  Mouth/Throat: Oropharynx is clear and moist.  TMs and canals are clear.   Eyes: Pupils are equal, round, and reactive to light. Conjunctivae and EOM are normal.  Neck: Neck supple. No thyromegaly present.  Cardiovascular: Normal rate, regular rhythm and normal heart sounds.  Pulmonary/Chest: Effort normal and breath sounds normal. She has no wheezes.  Lymphadenopathy:    She has no cervical adenopathy.  Neurological: She is alert and oriented to person, place, and time.  Skin: Skin is warm and dry.  Psychiatric: She has a normal mood and affect.       Assessment & Plan:  Upper respiratory infection-she did have a positive strep test and strep throat is going around but she also has a cough which is atypical for strep.  I did go ahead and place her on penicillin and send culture for confirmation.  We will also write a prescription for Tessalon Perles.  If this is viral than it may take a week to 2 weeks for the symptoms to completely resolve.

## 2017-12-21 LAB — CULTURE, GROUP A STREP
MICRO NUMBER:: 91202859
SPECIMEN QUALITY: ADEQUATE

## 2018-07-03 ENCOUNTER — Ambulatory Visit (INDEPENDENT_AMBULATORY_CARE_PROVIDER_SITE_OTHER): Payer: BLUE CROSS/BLUE SHIELD | Admitting: Family Medicine

## 2018-07-03 ENCOUNTER — Encounter: Payer: Self-pay | Admitting: Family Medicine

## 2018-07-03 VITALS — BP 139/73 | HR 80 | Temp 98.0°F | Wt 295.0 lb

## 2018-07-03 DIAGNOSIS — R519 Headache, unspecified: Secondary | ICD-10-CM

## 2018-07-03 DIAGNOSIS — R51 Headache: Secondary | ICD-10-CM | POA: Diagnosis not present

## 2018-07-03 DIAGNOSIS — M7061 Trochanteric bursitis, right hip: Secondary | ICD-10-CM | POA: Diagnosis not present

## 2018-07-03 MED ORDER — DICLOFENAC SODIUM 1 % TD GEL: 4.0000 g | Freq: Four times a day (QID) | TRANSDERMAL | 11 refills | Status: DC

## 2018-07-03 NOTE — Progress Notes (Signed)
Ann Brock is a 33 y.o. female who presents to [facility]: [place] today for right hip pain right low back pain and headache.  Ann Brock notes a about 1 week history of right-sided lateral hip pain and right low back pain.  She cannot recall any specific injury but notes the pain can be quite bad at times.  Pain is worse when she is trying to stand from a seated position and when she lays on her right side as well as with prolonged standing and walking.  She is tried ibuprofen and Tylenol which is helped a little but not enough to control her pain.  She continues to work at [employer].   She also notes a headache starting about a week ago as well.  She wakes up in the morning with a throbbing headache.  She denies any vision change loss of function weakness or numbness.  She notes that the medicines that she takes for her hip does help her headaches sometimes.   ROS as above:  Exam:  BP 139/73   Pulse 80   Temp 98 F (36.7 C) (Oral)   Wt 295 lb (133.8 kg)   BMI 47.61 kg/m  Wt Readings from Last 5 Encounters:  07/03/18 295 lb (133.8 kg)  12/19/17 292 lb (132.5 kg)  08/16/17 258 lb 8 oz (117.3 kg)  08/16/17 284 lb (128.8 kg)  07/18/17 288 lb (130.6 kg)    Gen: Well NAD HEENT: EOMI,  MMM normal retinal exam.  No changes to fundus. Lungs: Normal work of breathing. CTABL Heart: RRR no MRG Abd: NABS, Soft. Nondistended, Nontender Exts: Brisk capillary refill, warm and well perfused.  Neuro alert and oriented normal coordination balance and gait. MSK: L-spine nontender to spinal midline.  Normal lumbar motion.  Negative slump test. Lower extremity reflexes and sensation are equal and normal throughout. Right hip normal-appearing normal motion. Tender palpation greater trochanter. Hip abduction strength diminished 4/5. Mild antalgic gait present.      Assessment and Plan: 33 y.o. female with  Right hip pain is very likely trochanteric bursitis/hip abductor tendinopathy.  We discussed options.  Plan for home exercise program including strengthening and stretching as well as diclofenac gel.  If not improving next step would be injection and formal physical therapy.  I normally would be prescribing or ordering physical therapy at this point however would like to minimize risk with COVID-19.  Headache: Unclear etiology.  Concern for medication overuse headache given heavy NSAID and Tylenol use with hip pain.  Pseudotumor cerebri is also a possibility but much less.  Her funduscopic exam was normal per my exam today.  I am hopeful that her headache will improve when she is able to use less oral analgesia with her hip pain.  Watchful waiting and recheck sooner if needed.  PDMP not reviewed this encounter. No orders of the defined types were placed in this encounter.  Meds ordered this encounter  Medications  . diclofenac sodium (VOLTAREN) 1 % GEL    Sig: Apply 4 g topically 4 (four) times daily. To affected joint.    Dispense:  100 g    Refill:  11     Historical information moved to improve visibility of documentation.  Past Medical History:  Diagnosis Date  . Chest pain    2014  . Ectopic pregnancy    treated with methotrexate  . Hyperemesis gravidarum   . Hypertension  prior to preg, no meds  . Infection    UTI  . Vaginal Pap smear, abnormal    repeat was normal   Past Surgical History:  Procedure Laterality Date  . CESAREAN SECTION    . CESAREAN SECTION N/A 08/05/2015   Procedure: REPEAT CESAREAN SECTION ;  Surgeon: Donnamae Jude, MD;  Location: [place];  Service: Obstetrics;  Laterality: N/A;  . CHOLECYSTECTOMY    . KNEE SURGERY    . TONSILLECTOMY AND ADENOIDECTOMY     Social History   Tobacco Use  . Smoking status: Former Research scientist (life sciences)  . Smokeless tobacco: Never Used  . Tobacco comment: quit  2006   Substance Use Topics  . Alcohol use: No    Alcohol/week: 0.0 standard drinks    Comment: socially prior to pregnancy   family history includes Alcohol abuse in her paternal grandfather and paternal grandmother; Birth defects in her brother; Cancer in her maternal grandfather, maternal grandmother, paternal grandfather, and paternal grandmother; Cerebral palsy in her sister; Depression in her mother; Diabetes in her maternal grandmother; Drug abuse in her father; Heart disease in her father; Hyperlipidemia in her father; Hypertension in her father and mother; Kidney disease in her sister; Stroke in her father, maternal grandfather, maternal grandmother, paternal grandfather, and paternal grandmother.  Medications: Current Outpatient Medications  Medication Sig Dispense Refill  . acetaminophen (TYLENOL) 325 MG tablet Take 650 mg by mouth every 6 (six) hours as needed for headache.     . busPIRone (BUSPAR) 15 MG tablet     . eszopiclone (LUNESTA) 2 MG TABS tablet     . hydrochlorothiazide (HYDRODIURIL) 25 MG tablet Take 1 tablet (25 mg total) by mouth daily. 30 tablet 2  . ketorolac (TORADOL) 10 MG tablet Take 1 tablet (10 mg total) by mouth every 6 (six) hours as needed. 15 tablet 0  . lamoTRIgine (LAMICTAL) 100 MG tablet 100 mg.    . lamoTRIgine (LAMICTAL) 25 MG tablet 25 mg.    . LORazepam (ATIVAN) 0.5 MG tablet     . triamcinolone cream (KENALOG) 0.1 % Apply topically.    . diclofenac sodium (VOLTAREN) 1 % GEL Apply 4 g topically 4 (four) times daily. To affected joint. 100 g 11   No current facility-administered medications for this visit.    Allergies  Allergen Reactions  . Aspirin Anaphylaxis  . Cephalosporins Anaphylaxis  . Contrast Media  [Iodinated Diagnostic Agents] Anaphylaxis     Discussed warning signs or symptoms. Please see discharge instructions. Patient expresses understanding.

## 2018-07-03 NOTE — Patient Instructions (Signed)
Thank you for coming in today. I think the hip pain is due to trochanteric burisits. Work on the side leg raises for strength. 30 resp 2x daily.  Do the cross over stretch and the figure 4 stretch after exercises.  Do the standing IT band stretch as needed.  Use the diclofenac gel.   For headache I think it is medication rebound headache although I am also thinking about pseudotumor cebrei   Trochanteric Bursitis Trochanteric bursitis is a condition that causes hip pain. Trochanteric bursitis happens when fluid-filled sacs (bursae) in the hip get irritated. Normally these sacs absorb shock and help strong bands of tissue (tendons) in your hip glide smoothly over each other and over your hip bones. What are the causes? This condition results from increased friction between the hip bones and the tendons that go over them. This condition can happen if you:  Have weak hips.  Use your hip muscles too much (overuse).  Get hit in the hip. What increases the risk? This condition is more likely to develop in:  Women.  Adults who are middle-aged or older.  People with arthritis or a spinal condition.  People with weak buttocks muscles (gluteal muscles).  People who have one leg that is shorter than the other.  People who participate in certain kinds of athletic activities, such as: ? Running sports, especially long-distance running. ? Contact sports, like football or martial arts. ? Sports in which falls may occur, like skiing. What are the signs or symptoms? The main symptom of this condition is pain and tenderness over the point of your hip. The pain may be:  Sharp and intense.  Dull and achy.  Felt on the outside of your thigh. It may increase when you:  Lie on your side.  Walk or run.  Go up on stairs.  Sit.  Stand up after sitting.  Stand for long periods of time. How is this diagnosed? This condition may be diagnosed based on:  Your symptoms.  Your medical  history.  A physical exam.  Imaging tests, such as: ? X-rays to check your bones. ? An MRI or ultrasound to check your tendons and muscles. During your physical exam, your health care provider will check the movement and strength of your hip. He or she may press on the point of your hip to check for pain. How is this treated? This condition may be treated by:  Resting.  Reducing your activity.  Avoiding activities that cause pain.  Using crutches, a cane, or a walker to decrease the strain on your hip.  Taking medicine to help with swelling.  Having medicine injected into the bursae to help with swelling.  Using ice, heat, and massage therapy for pain relief.  Physical therapy exercises for strength and flexibility.  Surgery (rare). Follow these instructions at home: Activity  Rest.  Avoid activities that cause pain.  Return to your normal activities as told by your health care provider. Ask your health care provider what activities are safe for you. Managing pain, stiffness, and swelling  Take over-the-counter and prescription medicines only as told by your health care provider.  If directed, apply heat to the injured area as told by your health care provider. ? Place a towel between your skin and the heat source. ? Leave the heat on for 20-30 minutes. ? Remove the heat if your skin turns bright red. This is especially important if you are unable to feel pain, heat, or cold. You may have  a greater risk of getting burned.  If directed, apply ice to the injured area: ? Put ice in a plastic bag. ? Place a towel between your skin and the bag. ? Leave the ice on for 20 minutes, 2-3 times a day. General instructions  If the affected leg is one that you use for driving, ask your health care provider when it is safe to drive.  Use crutches, a cane, or a walker as told by your health care provider.  If one of your legs is shorter than the other, get fitted for a shoe  insert.  Lose weight if you are overweight. How is this prevented?  Wear supportive footwear that is appropriate for your sport.  If you have hip pain, start any new exercise or sport slowly.  Maintain physical fitness, including: ? Strength. ? Flexibility. Contact a health care provider if:  Your pain does not improve with 2-4 weeks. Get help right away if:  You develop severe pain.  You have a fever.  You develop increased redness over your hip.  You have a change in your bowel function or bladder function.  You cannot control the muscles in your feet. This information is not intended to replace advice given to you by your health care provider. Make sure you discuss any questions you have with your health care provider. Document Released: 04/08/2004 Document Revised: 11/05/2015 Document Reviewed: 02/14/2015 Elsevier Interactive Patient Education  2019 [employer].    Analgesic Rebound Headache An analgesic rebound headache, sometimes called a medication overuse headache, is a headache that comes after pain medicine (analgesic) taken to treat the original (primary) headache has worn off. Any type of primary headache can return as a rebound headache if a person regularly takes analgesics more than three times a week to treat it. The types of primary headaches that are commonly associated with rebound headaches include:  Migraines.  Headaches that arise from tense muscles in the head and neck area (tension headaches).  Headaches that develop and happen again (recur) on one side of the head and around the eye (cluster headaches). If rebound headaches continue, they become chronic daily headaches. What are the causes? This condition may be caused by frequent use of:  Over-the-counter medicines such as aspirin, ibuprofen, and acetaminophen.  Sinus relief medicines and other medicines that contain caffeine.  Narcotic pain medicines such as codeine and oxycodone. What are  the signs or symptoms? The symptoms of a rebound headache are the same as the symptoms of the original headache. Some of the symptoms of specific types of headaches include: Migraine headache  Pulsing or throbbing pain on one or both sides of the head.  Severe pain that interferes with daily activities.  Pain that is worsened by physical activity.  Nausea, vomiting, or both.  Pain with exposure to bright light, loud noises, or strong smells.  General sensitivity to bright light, loud noises, or strong smells.  Visual changes.  Numbness of one or both arms. Tension headache  Pressure around the head.  Dull, aching head pain.  Pain felt over the front and sides of the head.  Tenderness in the muscles of the head, neck, and shoulders. Cluster headache  Severe pain that begins in or around one eye or temple.  Redness and tearing in the eye on the same side as the pain.  Droopy or swollen eyelid.  One-sided head pain.  Nausea.  Runny nose.  Sweaty, pale facial skin.  Restlessness. How is this diagnosed?  This condition is diagnosed by:  Reviewing your medical history. This includes the nature of your primary headaches.  Reviewing the types of pain medicines that you have been using to treat your headaches and how often you take them. How is this treated? This condition may be treated or managed by:  Discontinuing frequent use of the analgesic medicine. Doing this may worsen your headaches at first, but the pain should eventually become more manageable, less frequent, and less severe.  Seeing a headache specialist. He or she may be able to help you manage your headaches and help make sure there is not another cause of the headaches.  Using methods of stress relief, such as acupuncture, counseling, biofeedback, and massage. Talk with your health care provider about which methods might be good for you. Follow these instructions at home:  Take over-the-counter and  prescription medicines only as told by your health care provider.  Stop the repeated use of pain medicine as told by your health care provider. Stopping can be difficult. Carefully follow instructions from your health care provider.  Avoid triggers that are known to cause your primary headaches.  Keep all follow-up visits as told by your health care provider. This is important. Contact a health care provider if:  You continue to experience headaches after following treatments that your health care provider recommended. Get help right away if:  You develop new headache pain.  You develop headache pain that is different than what you have experienced in the past.  You develop numbness or tingling in your arms or legs.  You develop changes in your speech or vision. This information is not intended to replace advice given to you by your health care provider. Make sure you discuss any questions you have with your health care provider. Document Released: 05/22/2003 Document Revised: 09/19/2015 Document Reviewed: 08/04/2015 Elsevier Interactive Patient Education  2019 [employer]   Idiopathic Intracranial Hypertension  Idiopathic intracranial hypertension (IIH) is a condition that increases pressure around the brain. The fluid that surrounds the brain and spinal cord (cerebrospinal fluid, CSF) increases and causes the pressure. Idiopathic means that the cause of this condition is not known. IIH affects the brain and spinal cord (is a neurological disorder). If this condition is not treated, it can cause vision loss or blindness. What increases the risk? You are more likely to develop this condition if:  You are severely overweight (obese).  You are a woman who has not gone through menopause.  You take certain medicines, such as birth control or steroids. What are the signs or symptoms? Symptoms of IIH include:  Headaches. This is the most common symptom.  Pain in the shoulders or  neck.  Nausea and vomiting.  A "rushing water" or pulsing sound within the ears (pulsatile tinnitus).  Double vision.  Blurred vision.  Brief episodes of complete vision loss. How is this diagnosed? This condition may be diagnosed based on:  Your symptoms.  Your medical history.  CT scan of the brain.  MRI of the brain.  Magnetic resonance venogram (MRV) to check veins in the brain.  Diagnostic lumbar puncture. This is a procedure to remove and examine a sample of cerebrospinal fluid. This procedure can determine whether too much fluid may be causing IIH.  A thorough eye exam to check for swelling or nerve damage in the eyes. How is this treated? Treatment for this condition depends on your symptoms. The goal of treatment is to decrease the pressure around your brain. Common  treatments include:  Medicines to decrease the production of spinal fluid and lower the pressure within your skull.  Medicines to prevent or treat headaches.  Surgery to place drains (shunts) in your brain to remove excess fluid.  Lumbar puncture to remove excess cerebrospinal fluid. Follow these instructions at home:  If you are overweight or obese, work with your health care provider to lose weight.  Take over-the-counter and prescription medicines only as told by your health care provider.  Do not drive or use heavy machinery while taking medicines that can make you sleepy.  Keep all follow-up visits as told by your health care provider. This is important. Contact a health care provider if:  You have changes in your vision, such as: ? Double vision. ? Not being able to see colors (color vision). Get help right away if:  You have any of the following symptoms and they get worse or do not get better. ? Headaches. ? Nausea. ? Vomiting. ? Vision changes or difficulty seeing. Summary  Idiopathic intracranial hypertension (IIH) is a condition that increases pressure around the brain. The  cause is not known (is idiopathic).  The most common symptom of IIH is headaches.  Treatment may include medicines or surgery to relieve the pressure on your brain. This information is not intended to replace advice given to you by your health care provider. Make sure you discuss any questions you have with your health care provider. Document Released: 05/10/2001 Document Revised: 01/21/2016 Document Reviewed: 01/21/2016 Elsevier Interactive Patient Education  2019 [employer].

## 2018-07-12 ENCOUNTER — Encounter: Payer: Self-pay | Admitting: Family Medicine

## 2018-08-21 ENCOUNTER — Encounter: Payer: BC Managed Care – PPO | Admitting: Family Medicine

## 2018-08-21 NOTE — Progress Notes (Deleted)
Ann Brock is a 33 y.o. female who presents to [facility]: [place] today for well adult visit.  *** Patient is due for cervical cancer screening.  Her last Pap smear was October 2016.  Additionally in the past she has had trochanteric bursitis hip pain.  She notes that ***  ROS as above:  Past Medical History:  Diagnosis Date  . Chest pain    2014  . Ectopic pregnancy    treated with methotrexate  . Hyperemesis gravidarum   . Hypertension    prior to preg, no meds  . Infection    UTI  . Vaginal Pap smear, abnormal    repeat was normal   Past Surgical History:  Procedure Laterality Date  . CESAREAN SECTION    . CESAREAN SECTION N/A 08/05/2015   Procedure: REPEAT CESAREAN SECTION ;  Surgeon: Donnamae Jude, MD;  Location: [facility];  Service: Obstetrics;  Laterality: N/A;  . CHOLECYSTECTOMY    . KNEE SURGERY    . TONSILLECTOMY AND ADENOIDECTOMY     Social History   Tobacco Use  . Smoking status: Former Research scientist (life sciences)  . Smokeless tobacco: Never Used  . Tobacco comment: quit  2006  Substance Use Topics  . Alcohol use: No    Alcohol/week: 0.0 standard drinks    Comment: socially prior to pregnancy   family history includes Alcohol abuse in her paternal grandfather and paternal grandmother; Birth defects in her brother; Cancer in her maternal grandfather, maternal grandmother, paternal grandfather, and paternal grandmother; Cerebral palsy in her sister; Depression in her mother; Diabetes in her maternal grandmother; Drug abuse in her father; Heart disease in her father; Hyperlipidemia in her father; Hypertension in her father and mother; Kidney disease in her sister; Stroke in her father, maternal grandfather, maternal grandmother, paternal grandfather, and paternal grandmother.  Medications: Current Outpatient Medications  Medication Sig Dispense Refill   . acetaminophen (TYLENOL) 325 MG tablet Take 650 mg by mouth every 6 (six) hours as needed for headache.     . busPIRone (BUSPAR) 15 MG tablet     . diclofenac sodium (VOLTAREN) 1 % GEL Apply 4 g topically 4 (four) times daily. To affected joint. 100 g 11  . eszopiclone (LUNESTA) 2 MG TABS tablet     . hydrochlorothiazide (HYDRODIURIL) 25 MG tablet Take 1 tablet (25 mg total) by mouth daily. 30 tablet 2  . ketorolac (TORADOL) 10 MG tablet Take 1 tablet (10 mg total) by mouth every 6 (six) hours as needed. 15 tablet 0  . lamoTRIgine (LAMICTAL) 100 MG tablet 100 mg.    . lamoTRIgine (LAMICTAL) 25 MG tablet 25 mg.    . LORazepam (ATIVAN) 0.5 MG tablet     . triamcinolone cream (KENALOG) 0.1 % Apply topically.     No current facility-administered medications for this visit.    Allergies  Allergen Reactions  . Aspirin Anaphylaxis  . Cephalosporins Anaphylaxis  . Contrast Media  [Iodinated Diagnostic Agents] Anaphylaxis    Health Maintenance Health Maintenance  Topic Date Due  . PAP SMEAR-Modifier  01/02/2018  . INFLUENZA VACCINE  10/14/2018  . TETANUS/TDAP  11/13/2024  . HIV Screening  Completed     Exam:  There were no vitals taken for this visit. Wt Readings from Last 5 Encounters:  07/03/18 295 lb (133.8 kg)  12/19/17 292 lb (132.5 kg)  08/16/17 258 lb 8 oz (117.3 kg)  08/16/17 284 lb (128.8 kg)  07/18/17 288 lb (130.6 kg)      Gen: Well NAD HEENT: EOMI,  MMM Lungs: Normal work of breathing. CTABL Heart: RRR no MRG Abd: NABS, Soft. Nondistended, Nontender Exts: Brisk capillary refill, warm and well perfused.  Psych: ***  Depression screen [facility] 2/9 05/10/2017 03/14/2017 06/14/2016 05/19/2016 02/11/2016  Decreased Interest 2 1 0 2 3  Down, Depressed, Hopeless 2 1 0 2 3  PHQ - 2 Score 4 2 0 4 6  Altered sleeping 3 3 - 3 3  Tired, decreased energy 1 2 - 3 3  Change in appetite 1 2 - 2 3  Feeling bad or failure about yourself  1 2 - 2 3  Trouble concentrating 1 2 - 2 3   Moving slowly or fidgety/restless 1 2 - 1 3  Suicidal thoughts 0 0 - 0 0  PHQ-9 Score 12 15 - 17 24  Difficult doing work/chores Very difficult - - - Extremely dIfficult       Lab and Radiology Results No results found for this or any previous visit (from the past 72 hour(s)). No results found.    Assessment and Plan: 33 y.o. female with ***  PDMP not reviewed this encounter. No orders of the defined types were placed in this encounter.  No orders of the defined types were placed in this encounter.    Discussed warning signs or symptoms. Please see discharge instructions. Patient expresses understanding.

## 2018-08-25 ENCOUNTER — Telehealth: Payer: Self-pay | Admitting: Family Medicine

## 2018-08-25 NOTE — Telephone Encounter (Signed)
Patient did not show up for her physical on Monday, June 8.  She still due for physical and Pap smear.  She should reschedule if able.

## 2018-08-25 NOTE — Telephone Encounter (Signed)
Tried calling pt on both numbers and it would ring and then go to a busy tone

## 2018-08-25 NOTE — Telephone Encounter (Signed)
Attempted to reach patient at numbers provided. They were both with a busy dial tone. Could not leave a voicemail.

## 2018-08-25 NOTE — Telephone Encounter (Signed)
Routed to the front staff to reschedule.

## 2018-09-25 ENCOUNTER — Encounter: Payer: Self-pay | Admitting: Family Medicine

## 2018-09-25 ENCOUNTER — Telehealth: Payer: BC Managed Care – PPO | Admitting: Physician Assistant

## 2018-09-25 ENCOUNTER — Telehealth: Payer: Self-pay

## 2018-09-25 ENCOUNTER — Encounter: Payer: Self-pay | Admitting: Physician Assistant

## 2018-09-25 DIAGNOSIS — R197 Diarrhea, unspecified: Secondary | ICD-10-CM

## 2018-09-25 DIAGNOSIS — R109 Unspecified abdominal pain: Secondary | ICD-10-CM

## 2018-09-25 DIAGNOSIS — K920 Hematemesis: Secondary | ICD-10-CM

## 2018-09-25 NOTE — Progress Notes (Signed)
Based on what you shared with me, I feel your condition warrants further evaluation and I recommend that you be seen for a face to face office visit.  NOTE: If you entered your credit card information for this eVisit, you will not be charged. You may see a "hold" on your card for the $35 but that hold will drop off and you will not have a charge processed.  Ann Brock,  Your symptoms of blood in vomitus and severe abdominal pain warrant a face to face evaluation for further management  If you are having a true medical emergency please call 911.     For an urgent face to face visit, [facility] has five urgent care centers for your convenience:    [email or web address] to reserve your spot online an avoid wait times  [facility] [address], [address] [place], [place] [address] Modified hours of operation: Monday-Friday, 12 PM to 6 PM  Closed Saturday & Sunday  *Across the street from [place] (New Address!) [address], [place], [place] [address] *Just off [address], across the road from [place] hours of operation: Monday-Friday, 12 PM to 6 PM  Closed Saturday & Sunday   The following sites will take your insurance:  . [facility]    [phone]                  Get Driving Directions  [address], [place] [address] . 10 am to 8 pm Monday-Friday . 12 pm to 8 pm Saturday-Sunday   . [facility] at [place]                  Get Driving Directions  [address], [place], [place] [address] . 8 am to 8 pm Monday-Friday . 9 am to 6 pm Saturday . 11 am to 6 pm Sunday   . [facility] at [place]                  Get Driving Directions   [address] [place], [place] [address] . 8 am to 8 pm Monday-Friday . 8 am to 4 pm Saturday-Sunday    . [facility] at [place]                    Get Driving Directions  [phone]  [address] [place], [place] [address]  . Monday-Friday, 12 PM to 6 PM    Your e-visit answers were reviewed by a board certified advanced clinical practitioner to complete your personal care plan.  Thank you for using e-Visits. I spent 5-10 minutes on review and completion of this note- Lacy Duverney [facility]

## 2018-09-25 NOTE — Telephone Encounter (Signed)
Agreed emergency room makes most sense. Evaluate abdominal pain.

## 2018-09-25 NOTE — Telephone Encounter (Signed)
Patient complains of terrible abdominal pain with nausea, vomiting, diarrhea and chills. Advised patient to go to ED.

## 2018-09-26 ENCOUNTER — Encounter: Payer: Self-pay | Admitting: Family Medicine

## 2018-09-26 ENCOUNTER — Ambulatory Visit (INDEPENDENT_AMBULATORY_CARE_PROVIDER_SITE_OTHER): Payer: BC Managed Care – PPO | Admitting: Family Medicine

## 2018-09-26 DIAGNOSIS — R1013 Epigastric pain: Secondary | ICD-10-CM

## 2018-09-26 DIAGNOSIS — K2901 Acute gastritis with bleeding: Secondary | ICD-10-CM

## 2018-09-26 DIAGNOSIS — R109 Unspecified abdominal pain: Secondary | ICD-10-CM

## 2018-09-26 MED ORDER — CIPROFLOXACIN HCL 500 MG PO TABS: 500.0000 mg | ORAL_TABLET | Freq: Two times a day (BID) | ORAL | 0 refills | Status: DC

## 2018-09-26 MED ORDER — OMEPRAZOLE 40 MG PO CPDR: 40.0000 mg | DELAYED_RELEASE_CAPSULE | Freq: Two times a day (BID) | ORAL | 3 refills | Status: DC

## 2018-09-26 MED ORDER — METRONIDAZOLE 500 MG PO TABS: 500.0000 mg | ORAL_TABLET | Freq: Two times a day (BID) | ORAL | 0 refills | Status: DC

## 2018-09-26 MED ORDER — SUCRALFATE 1 G PO TABS: 1.0000 g | ORAL_TABLET | Freq: Four times a day (QID) | ORAL | 0 refills | Status: DC

## 2018-09-26 MED ORDER — PROMETHAZINE HCL 25 MG PO TABS: 25.0000 mg | ORAL_TABLET | Freq: Four times a day (QID) | ORAL | 2 refills | Status: DC | PRN

## 2018-09-26 NOTE — Progress Notes (Signed)
Virtual Visit  I connected with      [name]  by a telemedicine application and verified that I am speaking with the correct person using two identifiers.   I discussed the limitations of evaluation and management by telemedicine and the availability of in person appointments. The patient expressed understanding and agreed to proceed.  History of Present Illness: Ann Brock is a 33 y.o. female who would like to discuss ED follow-up.  Patient was seen in the emergency room with abdominal pain and was diagnosed with gastroenteritis.  She was given IV fluids and Zofran.  She had lab work-up consisting of CBC that did not show leukocytosis, metabolic panel that was largely normal.  Lipase was normal and she also had a urinalysis that showed elevated leukocyte esterase white blood cells and bacteria although contaminated with squamous cells.   [name] Continues to have abdominal pain and vomiting and diarrhea.  She was prescribed Imodium and Zofran at discharge which she does not think is helped very much.  She notes a lot of stomach acid and reflux ongoing for the last few weeks.  Sometimes with vomit she can see streaks of blood in the vomit and sometimes she sees small amounts of blood in the stool.  She denies lots of blood or coffee-ground emesis.  She denies any melanotic stool.  She is been trying Tums and Alka-Seltzer and omeprazole 20 mg for her acid reflux which have not helped.     Additionally she notes urinary frequency and urgency.  She was not prescribed antibiotics at discharge for possible UTI.    She has an allergy listed to IV contrast.  She notes it caused hives but she was able to tolerate IV contrast pretty well with premedication previously.    History of laparoscopic cholecystectomy.   Observations/Objective: There were no vitals taken for this visit. Wt Readings from Last 5 Encounters:  07/03/18 295 lb (133.8 kg)  12/19/17 292 lb (132.5 kg)   08/16/17 258 lb 8 oz (117.3 kg)  08/16/17 284 lb (128.8 kg)  07/18/17 288 lb (130.6 kg)   Exam:  No shortness of breath.  Normal speech and thought process.  Lab and Radiology Results ED note and results reviewed from [place].   Assessment and Plan: 33 y.o. female with abdominal pain vomiting associate with acid reflux and blood-tinged vomit.  Likely gastritis.  Would like to limit patient's exposure to healthcare if possible however if not improving CT scan will be helpful.  However also given her history of IV contrast allergy she will need premedication with prednisone 50 mg 13 hours, 7 hours, and 1 hour prior to contrast exposure as well as Benadryl 50 mg 1 hour prior to contrast exposure.  We will try treating empirically for gastritis, UTI, enteritis with omeprazole 40 mg twice daily, Carafate 1 g 4 times daily for 1 week.  Additionally will treat with antibiotics Cipro Flagyl for 1 week.  Also will add promethazine as needed as well for vomiting.  If not improving tomorrow we will proceed with CT scan likely with in person visit.  If improving visit can be scheduled as a virtual visit is today.  PDMP not reviewed this encounter. No orders of the defined types were placed in this encounter.  Meds ordered this encounter  Medications  . omeprazole (PRILOSEC) 40 MG capsule    Sig: Take 1 capsule (40 mg total) by mouth 2 (two) times daily.    Dispense:  60 capsule  Refill:  3  . sucralfate (CARAFATE) 1 g tablet    Sig: Take 1 tablet (1 g total) by mouth 4 (four) times daily.    Dispense:  120 tablet    Refill:  0  . ciprofloxacin (CIPRO) 500 MG tablet    Sig: Take 1 tablet (500 mg total) by mouth 2 (two) times daily.    Dispense:  14 tablet    Refill:  0  . metroNIDAZOLE (FLAGYL) 500 MG tablet    Sig: Take 1 tablet (500 mg total) by mouth 2 (two) times daily for 7 days.    Dispense:  14 tablet    Refill:  0  . promethazine (PHENERGAN) 25 MG tablet    Sig: Take 1 tablet (25  mg total) by mouth every 6 (six) hours as needed for nausea or vomiting.    Dispense:  30 tablet    Refill:  2    Follow Up Instructions:    I discussed the assessment and treatment plan with the patient. The patient was provided an opportunity to ask questions and all were answered. The patient agreed with the plan and demonstrated an understanding of the instructions.   The patient was advised to call back or seek an in-person evaluation if the symptoms worsen or if the condition fails to improve as anticipated.  Time: 25 minutes of intraservice time, with >39 minutes of total time during today's visit.      Historical information moved to improve visibility of documentation.  Past Medical History:  Diagnosis Date  . Chest pain    2014  . Ectopic pregnancy    treated with methotrexate  . Hyperemesis gravidarum   . Hypertension    prior to preg, no meds  . Infection    UTI  . Vaginal Pap smear, abnormal    repeat was normal   Past Surgical History:  Procedure Laterality Date  . CESAREAN SECTION    . CESAREAN SECTION N/A 08/05/2015   Procedure: REPEAT CESAREAN SECTION ;  Surgeon: Donnamae Jude, MD;  Location: [place] [facility];  Service: Obstetrics;  Laterality: N/A;  . CHOLECYSTECTOMY    . KNEE SURGERY    . TONSILLECTOMY AND ADENOIDECTOMY     Social History   Tobacco Use  . Smoking status: Former Research scientist (life sciences)  . Smokeless tobacco: Never Used  . Tobacco comment: quit  2006  Substance Use Topics  . Alcohol use: No    Alcohol/week: 0.0 standard drinks    Comment: socially prior to pregnancy   family history includes Alcohol abuse in her paternal grandfather and paternal grandmother; Birth defects in her brother; Cancer in her maternal grandfather, maternal grandmother, paternal grandfather, and paternal grandmother; Cerebral palsy in her sister; Depression in her mother; Diabetes in her maternal grandmother; Drug abuse in her father; Heart disease in her father;  Hyperlipidemia in her father; Hypertension in her father and mother; Kidney disease in her sister; Stroke in her father, maternal grandfather, maternal grandmother, paternal grandfather, and paternal grandmother.  Medications: Current Outpatient Medications  Medication Sig Dispense Refill  . acetaminophen (TYLENOL) 325 MG tablet Take 650 mg by mouth every 6 (six) hours as needed for headache.     . busPIRone (BUSPAR) 15 MG tablet     . ciprofloxacin (CIPRO) 500 MG tablet Take 1 tablet (500 mg total) by mouth 2 (two) times daily. 14 tablet 0  . eszopiclone (LUNESTA) 2 MG TABS tablet     . hydrochlorothiazide (HYDRODIURIL) 25 MG tablet Take  1 tablet (25 mg total) by mouth daily. 30 tablet 2  . ketorolac (TORADOL) 10 MG tablet Take 1 tablet (10 mg total) by mouth every 6 (six) hours as needed. 15 tablet 0  . lamoTRIgine (LAMICTAL) 100 MG tablet 100 mg.    . lamoTRIgine (LAMICTAL) 25 MG tablet 25 mg.    . LORazepam (ATIVAN) 0.5 MG tablet     . metroNIDAZOLE (FLAGYL) 500 MG tablet Take 1 tablet (500 mg total) by mouth 2 (two) times daily for 7 days. 14 tablet 0  . omeprazole (PRILOSEC) 40 MG capsule Take 1 capsule (40 mg total) by mouth 2 (two) times daily. 60 capsule 3  . promethazine (PHENERGAN) 25 MG tablet Take 1 tablet (25 mg total) by mouth every 6 (six) hours as needed for nausea or vomiting. 30 tablet 2  . sucralfate (CARAFATE) 1 g tablet Take 1 tablet (1 g total) by mouth 4 (four) times daily. 120 tablet 0  . triamcinolone cream (KENALOG) 0.1 % Apply topically.     No current facility-administered medications for this visit.    Allergies  Allergen Reactions  . Aspirin Anaphylaxis  . Cephalosporins Anaphylaxis  . Contrast Media [Iodinated Diagnostic Agents] Hives

## 2018-09-26 NOTE — Patient Instructions (Signed)
Thank you for coming in today. Take omeprazole 40 mg twice daily to suppress acid reflux. Take Carafate 4 times daily for 1 week to coat the lining of the stomach. Can also take with Pepcid as well for acid reflux. Take metronidazole and ciprofloxacin antibiotics for UTI as well as intestinal infection. Use also promethazine (Phenergan) as needed for vomiting. If improving visit tomorrow will be over phone however if not improving or worsening will check back in person.  Keep me updated.  If dramatically worse today go to the emergency room. If your belly pain worsens, or you have high fever, bad vomiting, blood in your stool or black tarry stool go to the Emergency Room.    Gastritis, Adult Gastritis is inflammation of the stomach. There are two kinds of gastritis:  Acute gastritis. This kind develops suddenly.  Chronic gastritis. This kind is much more common and lasts for a long time. Gastritis happens when the lining of the stomach becomes weak or gets damaged. Without treatment, gastritis can lead to stomach bleeding and ulcers. What are the causes? This condition may be caused by:  An infection.  Drinking too much alcohol.  Certain medicines. These include steroids, antibiotics, and some over-the-counter medicines, such as aspirin or ibuprofen.  Having too much acid in the stomach.  A disease of the intestines or stomach.  Stress.  An allergic reaction.  Crohn's disease.  Some cancer treatments (radiation). Sometimes the cause of this condition is not known. What are the signs or symptoms? Symptoms of this condition include:  Pain or a burning sensation in the upper abdomen.  Nausea.  Vomiting.  An uncomfortable feeling of fullness after eating.  Weight loss.  Bad breath.  Blood in your vomit or stools. In some cases, there are no symptoms. How is this diagnosed? This condition may be diagnosed with:  Your medical history and a description of your  symptoms.  A physical exam.  Tests. These can include: ? Blood tests. ? Stool tests. ? A test in which a thin, flexible instrument with a light and a camera is passed down the esophagus and into the stomach (upper endoscopy). ? A test in which a sample of tissue is taken for testing (biopsy). How is this treated? This condition may be treated with medicines. The medicines that are used vary depending on the cause of the gastritis:  If the condition is caused by a bacterial infection, you may be given antibiotic medicines.  If the condition is caused by too much acid in the stomach, you may be given medicines called H2 blockers, proton pump inhibitors, or antacids. Treatment may also involve stopping the use of certain medicines, such as aspirin, ibuprofen, or other NSAIDs. Follow these instructions at home: Medicines  Take over-the-counter and prescription medicines only as told by your health care provider.  If you were prescribed an antibiotic medicine, take it as told by your health care provider. Do not stop taking the antibiotic even if you start to feel better. Eating and drinking   Eat small, frequent meals instead of large meals.  Avoid foods and drinks that make your symptoms worse.  Drink enough fluid to keep your urine pale yellow. Alcohol use  Do not drink alcohol if: ? Your health care provider tells you not to drink. ? You are pregnant, may be pregnant, or are planning to become pregnant.  If you drink alcohol: ? Limit your use to:  0-1 drink a day for women.  0-2 drinks  a day for men. ? Be aware of how much alcohol is in your drink. In the [place] one drink equals one 12 oz bottle of beer (355 mL), one 5 oz glass of wine (148 mL), or one 1 oz glass of hard liquor (44 mL). General instructions  Talk with your health care provider about ways to manage stress, such as getting regular exercise or practicing deep breathing, meditation, or yoga.  Do not use any  products that contain nicotine or tobacco, such as cigarettes and e-cigarettes. If you need help quitting, ask your health care provider.  Keep all follow-up visits as told by your health care provider. This is important. Contact a health care provider if:  Your symptoms get worse.  Your symptoms return after treatment. Get help right away if:  You vomit blood or material that looks like coffee grounds.  You have black or dark red stools.  You are unable to keep fluids down.  Your abdominal pain gets worse.  You have a fever.  You do not feel better after one week. Summary  Gastritis is inflammation of the lining of the stomach that can occur suddenly (acute) or develop slowly over time (chronic).  This condition is diagnosed with a medical history, a physical exam, or tests.  This condition may be treated with medicines to treat infection or medicines to reduce the amount of acid in your stomach.  Follow your health care provider's instructions about taking medicines, making changes to your diet, and knowing when to call for help. This information is not intended to replace advice given to you by your health care provider. Make sure you discuss any questions you have with your health care provider. Document Released: 02/23/2001 Document Revised: 07/19/2017 Document Reviewed: 07/19/2017 Elsevier Patient Education  2020 [employer].

## 2018-09-27 ENCOUNTER — Encounter: Payer: Self-pay | Admitting: Family Medicine

## 2018-09-27 ENCOUNTER — Ambulatory Visit (HOSPITAL_BASED_OUTPATIENT_CLINIC_OR_DEPARTMENT_OTHER)
Admission: RE | Admit: 2018-09-27 | Discharge: 2018-09-27 | Disposition: A | Discharge status: Home / Self Care | Payer: BC Managed Care – PPO | Source: Ambulatory Visit | Attending: Family Medicine | Admitting: Family Medicine

## 2018-09-27 ENCOUNTER — Other Ambulatory Visit: Payer: Self-pay

## 2018-09-27 ENCOUNTER — Encounter (HOSPITAL_BASED_OUTPATIENT_CLINIC_OR_DEPARTMENT_OTHER): Payer: Self-pay | Admitting: Emergency Medicine

## 2018-09-27 ENCOUNTER — Emergency Department (HOSPITAL_BASED_OUTPATIENT_CLINIC_OR_DEPARTMENT_OTHER)
Admission: EM | Admit: 2018-09-27 | Discharge: 2018-09-27 | Disposition: A | Discharge status: Home / Self Care | Payer: BC Managed Care – PPO | Attending: Emergency Medicine | Admitting: Emergency Medicine

## 2018-09-27 ENCOUNTER — Ambulatory Visit (INDEPENDENT_AMBULATORY_CARE_PROVIDER_SITE_OTHER): Payer: BC Managed Care – PPO | Admitting: Family Medicine

## 2018-09-27 VITALS — BP 157/96 | HR 84 | Temp 98.2°F | Wt 289.0 lb

## 2018-09-27 DIAGNOSIS — R109 Unspecified abdominal pain: Secondary | ICD-10-CM

## 2018-09-27 DIAGNOSIS — R1013 Epigastric pain: Secondary | ICD-10-CM

## 2018-09-27 DIAGNOSIS — Z87891 Personal history of nicotine dependence: Secondary | ICD-10-CM | POA: Insufficient documentation

## 2018-09-27 DIAGNOSIS — E669 Obesity, unspecified: Secondary | ICD-10-CM | POA: Insufficient documentation

## 2018-09-27 DIAGNOSIS — T7840XA Allergy, unspecified, initial encounter: Secondary | ICD-10-CM | POA: Insufficient documentation

## 2018-09-27 DIAGNOSIS — Z6841 Body Mass Index (BMI) 40.0 and over, adult: Secondary | ICD-10-CM | POA: Insufficient documentation

## 2018-09-27 DIAGNOSIS — I10 Essential (primary) hypertension: Secondary | ICD-10-CM | POA: Insufficient documentation

## 2018-09-27 DIAGNOSIS — Z79899 Other long term (current) drug therapy: Secondary | ICD-10-CM | POA: Insufficient documentation

## 2018-09-27 DIAGNOSIS — R21 Rash and other nonspecific skin eruption: Secondary | ICD-10-CM | POA: Diagnosis present

## 2018-09-27 DIAGNOSIS — E860 Dehydration: Secondary | ICD-10-CM

## 2018-09-27 DIAGNOSIS — K2901 Acute gastritis with bleeding: Secondary | ICD-10-CM | POA: Insufficient documentation

## 2018-09-27 MED ORDER — ONDANSETRON HCL 4 MG/2ML IJ SOLN
4.0000 mg | Freq: Once | INTRAMUSCULAR | Status: AC
  Administered 2018-09-27: 4 mg via INTRAVENOUS

## 2018-09-27 MED ORDER — FAMOTIDINE IN NACL 20-0.9 MG/50ML-% IV SOLN
20.0000 mg | Freq: Once | INTRAVENOUS | Status: AC
  Administered 2018-09-27: 20 mg via INTRAVENOUS
  Filled 2018-09-27: qty 50

## 2018-09-27 MED ORDER — DIPHENHYDRAMINE HCL 50 MG/ML IJ SOLN
25.0000 mg | Freq: Once | INTRAMUSCULAR | Status: AC
  Administered 2018-09-27: 25 mg via INTRAVENOUS
  Filled 2018-09-27: qty 1

## 2018-09-27 MED ORDER — IOHEXOL 300 MG/ML  SOLN
100.0000 mL | Freq: Once | INTRAMUSCULAR | Status: AC | PRN
  Administered 2018-09-27: 100 mL via INTRAVENOUS

## 2018-09-27 MED ORDER — ONDANSETRON HCL 4 MG/2ML IJ SOLN
INTRAMUSCULAR | Status: AC
  Filled 2018-09-27: qty 2

## 2018-09-27 MED ORDER — PREDNISONE 50 MG PO TABS: ORAL_TABLET | ORAL | 0 refills | Status: DC

## 2018-09-27 MED ORDER — PREDNISONE 50 MG PO TABS: 50.0000 mg | ORAL_TABLET | Freq: Every day | ORAL | 0 refills | Status: DC

## 2018-09-27 MED ORDER — METHYLPREDNISOLONE SODIUM SUCC 125 MG IJ SOLR
125.0000 mg | Freq: Once | INTRAMUSCULAR | Status: AC
  Administered 2018-09-27: 125 mg via INTRAVENOUS
  Filled 2018-09-27: qty 2

## 2018-09-27 NOTE — Progress Notes (Signed)
Ann Brock is a 33 y.o. female who presents to [facility]: [facility] today for epigastric abdominal pain.  Patient was seen in the emergency department on July 13 for epigastric abdominal pain nausea and vomiting.  She had lab work-up that was unremarkable.  She received antiemetics and IV fluids.  CT scan was not obtained.  She had a virtual visit with me yesterday where we discussed options.  She was treated with Cipro Flagyl and omeprazole and Carafate and Phenergan.  She started all these medicines and notes that they have helped a little.  She continues to have abdominal pain and occasional vomiting.  She notes occasional blood-tinged vomit.  She denies frank blood in the vomit or melanotic stool or coffee-ground emesis.  Additionally lab work-up did show possible UTI.  She denies significant urinary symptoms.  She notes that she is not producing much urine and feels a little bit lightheaded and dizzy since yesterday.  She notes that she is able to drink some fluids but not keep much down.  She has a history of hives with IV contrast.   ROS as above:  Exam:  BP (!) 157/96   Pulse 84   Temp 98.2 F (36.8 C) (Oral)   Wt 289 lb (131.1 kg)   BMI 46.65 kg/m  Wt Readings from Last 5 Encounters:  09/27/18 289 lb (131.1 kg)  07/03/18 295 lb (133.8 kg)  12/19/17 292 lb (132.5 kg)  08/16/17 258 lb 8 oz (117.3 kg)  08/16/17 284 lb (128.8 kg)    Gen: Well uncomfortable but not toxic appearing HEENT: EOMI,  MMM Lungs: Normal work of breathing. CTABL Heart: RRR no MRG Abd: NABS, Soft. Nondistended, mildly tender mid abdomen and epigastric region.  No rebound or guarding no masses palpated. Exts: Brisk capillary refill, warm and well perfused.   Lab and Radiology Results Labs and ED note from July 13 reviewed   In clinic IV was started.  Patient was given 1 L normal  saline, 80 mg Depo-Medrol IM, and GI cocktail.  She felt moderately improved.  She felt GI cocktail helped some.  She feels much less lightheaded and dizzy.  Assessment and Plan: 33 y.o. female with  Abdominal pain.  Unclear etiology.  Labs normal on 713.  Not responding to initial typical conservative management.  Plan to continue omeprazole Carafate Cipro Flagyl and Zofran Phenergan as needed.  Plan to proceed with CT scan abdomen and pelvis with IV and oral contrast.  However patient will need premedication prior to CT scan with 50 mg of prednisone at 613 hours, 7 hours, 1 hour prior to CT scan.  Additionally she will need 50 mg of oral Benadryl 1 hour prior to CT scan.  CT scan was scheduled for 9 PM tonight.  At 8 AM in my clinic she was given 80 mg of IM Depo-Medrol which should replace the 13-hour prednisone dose.  She will take 50 mg of oral prednisone at 2 PM and at 8 PM.  She will take Benadryl as above.  Additionally she will start her oral contrast about 2 hours prior to the CT scan as well.  Discussed precautions risks benefits and backup plan.  Recheck following CT scan results if needed.  Hopefully were on the right treatment and she will start feeling better.  Coordinated care with radiology schedulers.     Historical information moved to improve visibility of documentation.  Past Medical History:  Diagnosis Date  . Chest pain    2014  . Ectopic pregnancy    treated with methotrexate  . Hyperemesis gravidarum   . Hypertension    prior to preg, no meds  . Infection    UTI  . Vaginal Pap smear, abnormal    repeat was normal   Past Surgical History:  Procedure Laterality Date  . CESAREAN SECTION    . CESAREAN SECTION N/A 08/05/2015   Procedure: REPEAT CESAREAN SECTION ;  Surgeon: Donnamae Jude, MD;  Location: [facility];  Service: Obstetrics;  Laterality: N/A;  . CHOLECYSTECTOMY    . KNEE SURGERY    . TONSILLECTOMY AND ADENOIDECTOMY     Social History    Tobacco Use  . Smoking status: Former Research scientist (life sciences)  . Smokeless tobacco: Never Used  . Tobacco comment: quit  2006  Substance Use Topics  . Alcohol use: No    Alcohol/week: 0.0 standard drinks    Comment: socially prior to pregnancy   family history includes Alcohol abuse in her paternal grandfather and paternal grandmother; Birth defects in her brother; Cancer in her maternal grandfather, maternal grandmother, paternal grandfather, and paternal grandmother; Cerebral palsy in her sister; Depression in her mother; Diabetes in her maternal grandmother; Drug abuse in her father; Heart disease in her father; Hyperlipidemia in her father; Hypertension in her father and mother; Kidney disease in her sister; Stroke in her father, maternal grandfather, maternal grandmother, paternal grandfather, and paternal grandmother.  Medications: Current Outpatient Medications  Medication Sig Dispense Refill  . acetaminophen (TYLENOL) 325 MG tablet Take 650 mg by mouth every 6 (six) hours as needed for headache.     . busPIRone (BUSPAR) 15 MG tablet     . ciprofloxacin (CIPRO) 500 MG tablet Take 1 tablet (500 mg total) by mouth 2 (two) times daily. 14 tablet 0  . eszopiclone (LUNESTA) 2 MG TABS tablet     . hydrochlorothiazide (HYDRODIURIL) 25 MG tablet Take 1 tablet (25 mg total) by mouth daily. 30 tablet 2  . ketorolac (TORADOL) 10 MG tablet Take 1 tablet (10 mg total) by mouth every 6 (six) hours as needed. 15 tablet 0  . lamoTRIgine (LAMICTAL) 100 MG tablet 100 mg.    . lamoTRIgine (LAMICTAL) 25 MG tablet 25 mg.    . LORazepam (ATIVAN) 0.5 MG tablet     . metroNIDAZOLE (FLAGYL) 500 MG tablet Take 1 tablet (500 mg total) by mouth 2 (two) times daily for 7 days. 14 tablet 0  . omeprazole (PRILOSEC) 40 MG capsule Take 1 capsule (40 mg total) by mouth 2 (two) times daily. 60 capsule 3  . predniSONE (DELTASONE) 50 MG tablet Take one pill 13 hours, 7 hours, 1 hour prior to CT scan. 3 tablet 0  . promethazine  (PHENERGAN) 25 MG tablet Take 1 tablet (25 mg total) by mouth every 6 (six) hours as needed for nausea or vomiting. 30 tablet 2  . sucralfate (CARAFATE) 1 g tablet Take 1 tablet (1 g total) by mouth 4 (four) times daily. 120 tablet 0  . triamcinolone cream (KENALOG) 0.1 % Apply topically.     No current facility-administered medications for this visit.    Allergies  Allergen Reactions  . Aspirin Anaphylaxis  . Cephalosporins Anaphylaxis  . Contrast Media [Iodinated Diagnostic Agents] Hives     Discussed warning signs or symptoms. Please see discharge instructions. Patient expresses understanding.

## 2018-09-27 NOTE — ED Provider Notes (Signed)
[facility] Provider Note   CSN: [id number] Arrival date & time: 09/27/18  2119    History   Chief Complaint Chief Complaint  Patient presents with  . Allergic Reaction    HPI Ann Brock is a 33 y.o. female.     HPI Patient was at [facility] facility for an outpatient CT scan this evening.  Patient has a history of a reaction to contrast in the past.  Patient took Benadryl as instructed prior to the CT scan this evening.  While in the scanner the patient started developing itching in her throat.  She also felt lightheaded.  She noticed redness and rash in her extremities.  I was asked to go to the CT scan and to talk to the patient.  She decided to come to the emergency room for further treatment considering her persistent symptoms. Past Medical History:  Diagnosis Date  . Chest pain    2014  . Ectopic pregnancy    treated with methotrexate  . Hyperemesis gravidarum   . Hypertension    prior to preg, no meds  . Infection    UTI  . Vaginal Pap smear, abnormal    repeat was normal    Patient Active Problem List   Diagnosis Date Noted  . Abdominal cramping 05/10/2017  . Grief 05/10/2017  . HTN (hypertension) 05/10/2017  . Morbid obesity ([place]) 05/19/2016  . Right knee pain 05/19/2016  . Hirsutism 02/11/2016  . Major depression 02/11/2016  . GAD (generalized anxiety disorder) 02/11/2016  . Status post repeat low transverse cesarean section 08/05/2015  . Acid reflux 05/12/2015  . History of ectopic pregnancy 12/04/2014    Past Surgical History:  Procedure Laterality Date  . CESAREAN SECTION    . CESAREAN SECTION N/A 08/05/2015   Procedure: REPEAT CESAREAN SECTION ;  Surgeon: Donnamae Jude, MD;  Location: [place];  Service: Obstetrics;  Laterality: N/A;  . CHOLECYSTECTOMY    . KNEE SURGERY    . TONSILLECTOMY AND ADENOIDECTOMY       OB History    Gravida  5   Para  3   Term  3   Preterm      AB   2   Living  3     SAB  1   TAB      Ectopic  1   Multiple  0   Live Births  3        Obstetric Comments  MTX         Home Medications    Prior to Admission medications   Medication Sig Start Date End Date Taking? Authorizing Provider  acetaminophen (TYLENOL) 325 MG tablet Take 650 mg by mouth every 6 (six) hours as needed for headache.     [provider]  busPIRone (BUSPAR) 15 MG tablet  06/15/18   [provider]  ciprofloxacin (CIPRO) 500 MG tablet Take 1 tablet (500 mg total) by mouth 2 (two) times daily. 09/26/18   Gregor Hams, MD  eszopiclone Johnnye Sima) 2 MG TABS tablet  06/15/18   [provider]  hydrochlorothiazide (HYDRODIURIL) 25 MG tablet Take 1 tablet (25 mg total) by mouth daily. 05/10/17   Gregor Hams, MD  ketorolac (TORADOL) 10 MG tablet Take 1 tablet (10 mg total) by mouth every 6 (six) hours as needed. 08/16/17   Lajean Manes, CNM  lamoTRIgine (LAMICTAL) 100 MG tablet 100 mg. 11/07/17   [provider]  lamoTRIgine (LAMICTAL) 25 MG tablet 25 mg. 11/07/17   [provider]  LORazepam (ATIVAN) 0.5 MG tablet  06/15/18   [provider]  metroNIDAZOLE (FLAGYL) 500 MG tablet Take 1 tablet (500 mg total) by mouth 2 (two) times daily for 7 days. 09/26/18 10/03/18  Gregor Hams, MD  omeprazole (PRILOSEC) 40 MG capsule Take 1 capsule (40 mg total) by mouth 2 (two) times daily. 09/26/18   Gregor Hams, MD  predniSONE (DELTASONE) 50 MG tablet Take 1 tablet (50 mg total) by mouth daily. 09/27/18   Dorie Rank, MD  promethazine (PHENERGAN) 25 MG tablet Take 1 tablet (25 mg total) by mouth every 6 (six) hours as needed for nausea or vomiting. 09/26/18   Gregor Hams, MD  sucralfate (CARAFATE) 1 g tablet Take 1 tablet (1 g total) by mouth 4 (four) times daily. 09/26/18   Gregor Hams, MD  triamcinolone cream (KENALOG) 0.1 % Apply topically. 11/19/17   [provider]    Family History Family History  Problem  Relation Age of Onset  . Hypertension Mother   . Depression Mother   . Drug abuse Father   . Heart disease Father   . Hyperlipidemia Father   . Hypertension Father   . Stroke Father   . Cancer Maternal Grandmother   . Diabetes Maternal Grandmother   . Stroke Maternal Grandmother   . Cancer Maternal Grandfather   . Stroke Maternal Grandfather   . Alcohol abuse Paternal Grandmother   . Cancer Paternal Grandmother   . Stroke Paternal Grandmother   . Alcohol abuse Paternal Grandfather   . Cancer Paternal Grandfather   . Stroke Paternal Grandfather   . Cerebral palsy Sister   . Kidney disease Sister   . Birth defects Brother        vsd    Social History Social History   Tobacco Use  . Smoking status: Former Research scientist (life sciences)  . Smokeless tobacco: Never Used  . Tobacco comment: quit  2006  Substance Use Topics  . Alcohol use: No    Alcohol/week: 0.0 standard drinks    Comment: socially prior to pregnancy  . Drug use: No     Allergies   Aspirin, Cephalosporins, and Contrast media [iodinated diagnostic agents]   Review of Systems Review of Systems  All other systems reviewed and are negative.    Physical Exam Updated Vital Signs BP (!) 148/77 (BP Location: Right Arm)   Pulse (!) 123   Temp 98 F (36.7 C) (Oral)   Resp (!) 28   Ht 1.676 m (5\' 6" )   Wt 131.5 kg   SpO2 99%   BMI 46.81 kg/m   Physical Exam Vitals signs and nursing note reviewed.  Constitutional:      General: She is not in acute distress.    Appearance: She is well-developed. She is obese.  HENT:     Head: Normocephalic and atraumatic.     Right Ear: External ear normal.     Left Ear: External ear normal.     Nose: No congestion or rhinorrhea.     Mouth/Throat:     Comments: No uvula edema, no tongue edema Eyes:     General: No scleral icterus.       Right eye: No discharge.        Left eye: No discharge.     Conjunctiva/sclera: Conjunctivae normal.  Neck:     Musculoskeletal: Neck supple.      Trachea: No tracheal deviation.  Cardiovascular:     Rate and Rhythm: Normal rate and regular rhythm.  Pulmonary:     Effort: Pulmonary effort is normal. No respiratory distress.     Breath sounds: Normal breath sounds. No stridor. No wheezing or rales.     Comments: No wheezes noted, no stridor, speaking without difficulty Abdominal:     General: Bowel sounds are normal. There is no distension.     Palpations: Abdomen is soft.     Tenderness: There is no abdominal tenderness. There is no guarding or rebound.  Musculoskeletal:        General: No tenderness.  Skin:    General: Skin is warm and dry.     Findings: Rash present.     Comments: Erythema noted on torso and extremities, no urticaria  Neurological:     Mental Status: She is alert.     Cranial Nerves: No cranial nerve deficit (no facial droop, extraocular movements intact, no slurred speech).     Sensory: No sensory deficit.     Motor: No abnormal muscle tone or seizure activity.     Coordination: Coordination normal.      ED Treatments / Results  Labs (all labs ordered are listed, but only abnormal results are displayed) Labs Reviewed - No data to display  EKG None  Radiology Ct Abdomen Pelvis W Contrast  Result Date: 09/27/2018 CLINICAL DATA:  33 year old female with epigastric abdominal pain, persistent since July 13th. EXAM: CT ABDOMEN AND PELVIS WITH CONTRAST TECHNIQUE: Multidetector CT imaging of the abdomen and pelvis was performed using the standard protocol following bolus administration of intravenous contrast. CONTRAST:  129mL OMNIPAQUE IOHEXOL 300 MG/ML  SOLN COMPARISON:  CT Abdomen and Pelvis 01/25/2016 [facility] FINDINGS: Lower chest: Negative. Hepatobiliary: Surgically absent gallbladder. Negative liver. Pancreas: Negative. Spleen: Negative. Adrenals/Urinary Tract: Normal adrenal glands. Symmetric and normal bilateral renal enhancement. No perinephric stranding  or hydronephrosis. Normal proximal ureters. Unremarkable urinary bladder. Occasional pelvic phleboliths. Stomach/Bowel: Negative descending and rectosigmoid colon. Negative transverse and right colon aside from some retained stool. Normal appendix visible on coronal image 51. Negative terminal ileum. Oral contrast has not yet reached the distal small bowel. No dilated or abnormal small bowel loops. Decompressed and negative stomach, duodenum. No free air, free fluid. Vascular/Lymphatic: Suboptimal intravascular contrast bolus but the major arterial structures appear patent and normal. Portal venous system appears patent. There are venous collaterals partially visible in the anterior upper thighs, more so on the right (series 2, image 99). But the central pelvic veins and IVC appear to be patent. No lymphadenopathy. Reproductive: Stable, IUD remains in place. Other: No pelvic free fluid. Musculoskeletal: Stable endplate and facet degeneration at the lumbosacral junction. Otherwise negative. IMPRESSION: 1. No acute or inflammatory process identified in the abdomen or pelvis. Normal appendix. 2. Partially visible chronic venous collaterals in the anterior upper thighs, more so on the right. The major vascular structures in the abdomen and pelvis appear patent. 3. Chronic IUD. Electronically Signed   By: Genevie Ann M.D.   On: 09/27/2018 21:38    Procedures Procedures (including critical care time)  Medications Ordered in ED Medications  diphenhydrAMINE (BENADRYL) injection 25 mg (25 mg Intravenous Given 09/27/18 2128)  methylPREDNISolone sodium succinate (SOLU-MEDROL) 125 mg/2 mL injection 125 mg (125 mg Intravenous Given 09/27/18 2128)  famotidine (PEPCID) IVPB 20 mg premix (0 mg Intravenous Stopped 09/27/18 2153)  ondansetron (ZOFRAN) injection 4 mg (4 mg Intravenous Given 09/27/18 2157)  Initial Impression / Assessment and Plan / ED Course  I have reviewed the triage vital signs and the nursing notes.   Pertinent labs & imaging results that were available during my care of the patient were reviewed by me and considered in my medical decision making (see chart for details).  Clinical Course as of Sep 27 2307  Wed Sep 27, 2018  2308 Patient is feeling better at this time.  Her symptoms have resolved.   [JK]    Clinical Course User Index [JK] Dorie Rank, MD     Patient presented to the ED for evaluation after reaction associated with a contrast CT scan.  Patient did have some evidence of erythema on the skin but there were no discrete hives and she did not have any evidence of angioedema.  Patient responded to antihistamines and steroids.  She did not require epinephrine.  She is stable for discharge and I will have her take antihistamines and steroids for the next 5 days.  Final Clinical Impressions(s) / ED Diagnoses   Final diagnoses:  Allergic reaction, initial encounter    ED Discharge Orders         Ordered    predniSONE (DELTASONE) 50 MG tablet  Daily     09/27/18 2308           Dorie Rank, MD 09/27/18 2309

## 2018-09-27 NOTE — ED Triage Notes (Signed)
Pt brought over from out patient CT with allergic reaction to IV dye. Pt s/o itching in throat.

## 2018-09-27 NOTE — ED Notes (Signed)
Pt called out to report feeling nauseous pt heard from room dry heaving. EDP informed. See [facility]

## 2018-09-27 NOTE — Discharge Instructions (Addendum)
Take the steroids as prescribed, also take an over-the-counter antihistamine for the next 5 days,

## 2018-09-27 NOTE — Patient Instructions (Signed)
Thank you for coming in today. Check into the [facility] ED at 830-845pm.  Let them know you have a CT scan scheduled.  Take prednisone at 2pm and at 8pm.  Take 50mg  benadryl at 8pm.  Start drinking the oral contrast about 2-3 hours prior to the CT scan.   Continue medicine for nausea and vomiting.   If your belly pain worsens, or you have high fever, bad vomiting, blood in your stool or black tarry stool go to the Emergency Room.     Gastritis, Adult Gastritis is inflammation of the stomach. There are two kinds of gastritis:  Acute gastritis. This kind develops suddenly.  Chronic gastritis. This kind is much more common and lasts for a long time. Gastritis happens when the lining of the stomach becomes weak or gets damaged. Without treatment, gastritis can lead to stomach bleeding and ulcers. What are the causes? This condition may be caused by:  An infection.  Drinking too much alcohol.  Certain medicines. These include steroids, antibiotics, and some over-the-counter medicines, such as aspirin or ibuprofen.  Having too much acid in the stomach.  A disease of the intestines or stomach.  Stress.  An allergic reaction.  Crohn's disease.  Some cancer treatments (radiation). Sometimes the cause of this condition is not known. What are the signs or symptoms? Symptoms of this condition include:  Pain or a burning sensation in the upper abdomen.  Nausea.  Vomiting.  An uncomfortable feeling of fullness after eating.  Weight loss.  Bad breath.  Blood in your vomit or stools. In some cases, there are no symptoms. How is this diagnosed? This condition may be diagnosed with:  Your medical history and a description of your symptoms.  A physical exam.  Tests. These can include: ? Blood tests. ? Stool tests. ? A test in which a thin, flexible instrument with a light and a camera is passed down the esophagus and into the stomach (upper endoscopy). ? A test in  which a sample of tissue is taken for testing (biopsy). How is this treated? This condition may be treated with medicines. The medicines that are used vary depending on the cause of the gastritis:  If the condition is caused by a bacterial infection, you may be given antibiotic medicines.  If the condition is caused by too much acid in the stomach, you may be given medicines called H2 blockers, proton pump inhibitors, or antacids. Treatment may also involve stopping the use of certain medicines, such as aspirin, ibuprofen, or other NSAIDs. Follow these instructions at home: Medicines  Take over-the-counter and prescription medicines only as told by your health care provider.  If you were prescribed an antibiotic medicine, take it as told by your health care provider. Do not stop taking the antibiotic even if you start to feel better. Eating and drinking   Eat small, frequent meals instead of large meals.  Avoid foods and drinks that make your symptoms worse.  Drink enough fluid to keep your urine pale yellow. Alcohol use  Do not drink alcohol if: ? Your health care provider tells you not to drink. ? You are pregnant, may be pregnant, or are planning to become pregnant.  If you drink alcohol: ? Limit your use to:  0-1 drink a day for women.  0-2 drinks a day for men. ? Be aware of how much alcohol is in your drink. In the [place] one drink equals one 12 oz bottle of beer (355 mL), one 5  oz glass of wine (148 mL), or one 1 oz glass of hard liquor (44 mL). General instructions  Talk with your health care provider about ways to manage stress, such as getting regular exercise or practicing deep breathing, meditation, or yoga.  Do not use any products that contain nicotine or tobacco, such as cigarettes and e-cigarettes. If you need help quitting, ask your health care provider.  Keep all follow-up visits as told by your health care provider. This is important. Contact a health care  provider if:  Your symptoms get worse.  Your symptoms return after treatment. Get help right away if:  You vomit blood or material that looks like coffee grounds.  You have black or dark red stools.  You are unable to keep fluids down.  Your abdominal pain gets worse.  You have a fever.  You do not feel better after one week. Summary  Gastritis is inflammation of the lining of the stomach that can occur suddenly (acute) or develop slowly over time (chronic).  This condition is diagnosed with a medical history, a physical exam, or tests.  This condition may be treated with medicines to treat infection or medicines to reduce the amount of acid in your stomach.  Follow your health care provider's instructions about taking medicines, making changes to your diet, and knowing when to call for help. This information is not intended to replace advice given to you by your health care provider. Make sure you discuss any questions you have with your health care provider. Document Released: 02/23/2001 Document Revised: 07/19/2017 Document Reviewed: 07/19/2017 Elsevier Patient Education  2020 [employer].

## 2018-09-28 ENCOUNTER — Encounter: Payer: Self-pay | Admitting: Family Medicine

## 2018-09-29 ENCOUNTER — Emergency Department (HOSPITAL_COMMUNITY): Payer: BC Managed Care – PPO

## 2018-09-29 ENCOUNTER — Other Ambulatory Visit: Payer: Self-pay

## 2018-09-29 ENCOUNTER — Emergency Department (HOSPITAL_COMMUNITY)
Admission: EM | Admit: 2018-09-29 | Discharge: 2018-09-30 | Disposition: A | Discharge status: Home / Self Care | Payer: BC Managed Care – PPO | Attending: Emergency Medicine | Admitting: Emergency Medicine

## 2018-09-29 DIAGNOSIS — I10 Essential (primary) hypertension: Secondary | ICD-10-CM | POA: Diagnosis not present

## 2018-09-29 DIAGNOSIS — R1031 Right lower quadrant pain: Secondary | ICD-10-CM | POA: Insufficient documentation

## 2018-09-29 DIAGNOSIS — Z87891 Personal history of nicotine dependence: Secondary | ICD-10-CM | POA: Diagnosis not present

## 2018-09-29 DIAGNOSIS — R112 Nausea with vomiting, unspecified: Secondary | ICD-10-CM | POA: Diagnosis not present

## 2018-09-29 DIAGNOSIS — Z79899 Other long term (current) drug therapy: Secondary | ICD-10-CM | POA: Diagnosis not present

## 2018-09-29 LAB — URINALYSIS, ROUTINE W REFLEX MICROSCOPIC
Bilirubin Urine: NEGATIVE
Glucose, UA: NEGATIVE mg/dL
Hgb urine dipstick: NEGATIVE
Ketones, ur: NEGATIVE mg/dL
Nitrite: NEGATIVE
Protein, ur: NEGATIVE mg/dL
Specific Gravity, Urine: 1.027 (ref 1.005–1.030)
pH: 5 (ref 5.0–8.0)

## 2018-09-29 LAB — COMPREHENSIVE METABOLIC PANEL
ALT: 31 U/L (ref 0–44)
AST: 19 U/L (ref 15–41)
Albumin: 3.9 g/dL (ref 3.5–5.0)
Alkaline Phosphatase: 87 U/L (ref 38–126)
Anion gap: 8 (ref 5–15)
BUN: 15 mg/dL (ref 6–20)
CO2: 26 mmol/L (ref 22–32)
Calcium: 9.1 mg/dL (ref 8.9–10.3)
Chloride: 105 mmol/L (ref 98–111)
Creatinine, Ser: 0.8 mg/dL (ref 0.44–1.00)
GFR calc Af Amer: >60 mL/min (ref >60)
GFR calc non Af Amer: >60 mL/min (ref >60)
Glucose, Bld: 104 mg/dL — ABNORMAL HIGH (ref 70–99)
Potassium: 4.3 mmol/L (ref 3.5–5.1)
Sodium: 139 mmol/L (ref 135–145)
Total Bilirubin: 0.6 mg/dL (ref 0.3–1.2)
Total Protein: 7.4 g/dL (ref 6.5–8.1)

## 2018-09-29 LAB — HCG, SERUM, QUALITATIVE: Preg, Serum: NEGATIVE

## 2018-09-29 LAB — CBC
HCT: 47 % — ABNORMAL HIGH (ref 36.0–46.0)
Hemoglobin: 14.9 g/dL (ref 12.0–15.0)
MCH: 26.8 pg (ref 26.0–34.0)
MCHC: 31.7 g/dL (ref 30.0–36.0)
MCV: 84.7 fL (ref 80.0–100.0)
Platelets: 292 10*3/uL (ref 150–400)
RBC: 5.55 MIL/uL — ABNORMAL HIGH (ref 3.87–5.11)
RDW: 13.4 % (ref 11.5–15.5)
WBC: 13 10*3/uL — ABNORMAL HIGH (ref 4.0–10.5)
nRBC: 0 % (ref 0.0–0.2)

## 2018-09-29 LAB — I-STAT BETA HCG BLOOD, ED (MC, WL, AP ONLY): I-stat hCG, quantitative: 56.8 m[IU]/mL — ABNORMAL HIGH (ref <5)

## 2018-09-29 LAB — LIPASE, BLOOD: Lipase: 27 U/L (ref 11–51)

## 2018-09-29 LAB — PREGNANCY, URINE: Preg Test, Ur: NEGATIVE

## 2018-09-29 MED ORDER — ONDANSETRON 8 MG PO TBDP: ORAL_TABLET | ORAL | 0 refills | Status: DC

## 2018-09-29 MED ORDER — SODIUM CHLORIDE 0.9 % IV BOLUS
1000.0000 mL | Freq: Once | INTRAVENOUS | Status: AC
  Administered 2018-09-29: 1000 mL via INTRAVENOUS

## 2018-09-29 MED ORDER — ONDANSETRON HCL 4 MG/2ML IJ SOLN
4.0000 mg | Freq: Once | INTRAMUSCULAR | Status: AC
  Administered 2018-09-29: 4 mg via INTRAVENOUS
  Filled 2018-09-29: qty 2

## 2018-09-29 MED ORDER — HYDROCODONE-ACETAMINOPHEN 5-325 MG PO TABS: 1.0000 | ORAL_TABLET | Freq: Four times a day (QID) | ORAL | 0 refills | Status: DC | PRN

## 2018-09-29 MED ORDER — HYDROMORPHONE HCL 1 MG/ML IJ SOLN
1.0000 mg | Freq: Once | INTRAMUSCULAR | Status: AC
  Administered 2018-09-29: 1 mg via INTRAVENOUS
  Filled 2018-09-29: qty 1

## 2018-09-29 MED ORDER — SODIUM CHLORIDE 0.9% FLUSH
3.0000 mL | Freq: Once | INTRAVENOUS | Status: AC
  Administered 2018-09-29: 3 mL via INTRAVENOUS

## 2018-09-29 MED ORDER — MORPHINE SULFATE (PF) 4 MG/ML IV SOLN
4.0000 mg | Freq: Once | INTRAVENOUS | Status: AC
  Administered 2018-09-29: 20:00:00 4 mg via INTRAVENOUS
  Filled 2018-09-29: qty 1

## 2018-09-29 NOTE — ED Provider Notes (Signed)
[facility] EMERGENCY DEPARTMENT Provider Note   CSN: [id number] Arrival date & time: 09/29/18  1515     History   Chief Complaint Chief Complaint  Patient presents with  . Abdominal Pain    HPI Ann Brock is a 33 y.o. female.     Patient is a 33 year old female with past medical history of hypertension, obesity, prior ectopic pregnancy.  She presents today for evaluation of abdominal pain.  She has been experiencing this over the past week.  She was seen 5 days ago by her primary care doctor, then sent to the ER.  Work-up at the [facility] ER showed no acute intra-abdominal process.  The patient was discharged with medication for nausea, however has not improved.  Her pain is now localized to the right mid abdomen.  She reports nausea and vomiting.  She denies any dysuria, fevers, or chills.  She denies any vaginal bleeding or discharge.  Patient does have an IUD and denies the possibility of pregnancy.  The history is provided by the patient.  Abdominal Pain Pain location:  RLQ Pain quality: stabbing   Pain radiates to:  Does not radiate Pain severity:  Moderate Onset quality:  Gradual Timing:  Constant Progression:  Worsening Chronicity:  New Relieved by:  Nothing Worsened by:  Movement and palpation Ineffective treatments:  None tried   Past Medical History:  Diagnosis Date  . Chest pain    2014  . Ectopic pregnancy    treated with methotrexate  . Hyperemesis gravidarum   . Hypertension    prior to preg, no meds  . Infection    UTI  . Vaginal Pap smear, abnormal    repeat was normal    Patient Active Problem List   Diagnosis Date Noted  . Abdominal cramping 05/10/2017  . Grief 05/10/2017  . HTN (hypertension) 05/10/2017  . Morbid obesity ([place]) 05/19/2016  . Right knee pain 05/19/2016  . Hirsutism 02/11/2016  . Major depression 02/11/2016  . GAD (generalized anxiety disorder) 02/11/2016  . Status post repeat low transverse  cesarean section 08/05/2015  . Acid reflux 05/12/2015  . History of ectopic pregnancy 12/04/2014    Past Surgical History:  Procedure Laterality Date  . CESAREAN SECTION    . CESAREAN SECTION N/A 08/05/2015   Procedure: REPEAT CESAREAN SECTION ;  Surgeon: Donnamae Jude, MD;  Location: [place];  Service: Obstetrics;  Laterality: N/A;  . CHOLECYSTECTOMY    . KNEE SURGERY    . TONSILLECTOMY AND ADENOIDECTOMY       OB History    Gravida  5   Para  3   Term  3   Preterm      AB  2   Living  3     SAB  1   TAB      Ectopic  1   Multiple  0   Live Births  3        Obstetric Comments  MTX         Home Medications    Prior to Admission medications   Medication Sig Start Date End Date Taking? Authorizing Provider  acetaminophen (TYLENOL) 325 MG tablet Take 650 mg by mouth every 6 (six) hours as needed for headache.     [provider]  busPIRone (BUSPAR) 15 MG tablet  06/15/18   [provider]  ciprofloxacin (CIPRO) 500 MG tablet Take 1 tablet (500 mg total) by mouth 2 (two) times daily. 09/26/18  Gregor Hams, MD  eszopiclone Johnnye Sima) 2 MG TABS tablet  06/15/18   [provider]  hydrochlorothiazide (HYDRODIURIL) 25 MG tablet Take 1 tablet (25 mg total) by mouth daily. 05/10/17   Gregor Hams, MD  ketorolac (TORADOL) 10 MG tablet Take 1 tablet (10 mg total) by mouth every 6 (six) hours as needed. 08/16/17   Lajean Manes, CNM  lamoTRIgine (LAMICTAL) 100 MG tablet 100 mg. 11/07/17   [provider]  lamoTRIgine (LAMICTAL) 25 MG tablet 25 mg. 11/07/17   [provider]  LORazepam (ATIVAN) 0.5 MG tablet  06/15/18   [provider]  metroNIDAZOLE (FLAGYL) 500 MG tablet Take 1 tablet (500 mg total) by mouth 2 (two) times daily for 7 days. 09/26/18 10/03/18  Gregor Hams, MD  omeprazole (PRILOSEC) 40 MG capsule Take 1 capsule (40 mg total) by mouth 2 (two) times daily. 09/26/18   Gregor Hams, MD  predniSONE  (DELTASONE) 50 MG tablet Take 1 tablet (50 mg total) by mouth daily. 09/27/18   Dorie Rank, MD  promethazine (PHENERGAN) 25 MG tablet Take 1 tablet (25 mg total) by mouth every 6 (six) hours as needed for nausea or vomiting. 09/26/18   Gregor Hams, MD  sucralfate (CARAFATE) 1 g tablet Take 1 tablet (1 g total) by mouth 4 (four) times daily. 09/26/18   Gregor Hams, MD  triamcinolone cream (KENALOG) 0.1 % Apply topically. 11/19/17   [provider]    Family History Family History  Problem Relation Age of Onset  . Hypertension Mother   . Depression Mother   . Drug abuse Father   . Heart disease Father   . Hyperlipidemia Father   . Hypertension Father   . Stroke Father   . Cancer Maternal Grandmother   . Diabetes Maternal Grandmother   . Stroke Maternal Grandmother   . Cancer Maternal Grandfather   . Stroke Maternal Grandfather   . Alcohol abuse Paternal Grandmother   . Cancer Paternal Grandmother   . Stroke Paternal Grandmother   . Alcohol abuse Paternal Grandfather   . Cancer Paternal Grandfather   . Stroke Paternal Grandfather   . Cerebral palsy Sister   . Kidney disease Sister   . Birth defects Brother        vsd    Social History Social History   Tobacco Use  . Smoking status: Former Research scientist (life sciences)  . Smokeless tobacco: Never Used  . Tobacco comment: quit  2006  Substance Use Topics  . Alcohol use: No    Alcohol/week: 0.0 standard drinks    Comment: socially prior to pregnancy  . Drug use: No     Allergies   Aspirin, Cephalosporins, and Contrast media [iodinated diagnostic agents]   Review of Systems Review of Systems  Gastrointestinal: Positive for abdominal pain.  All other systems reviewed and are negative.    Physical Exam Updated Vital Signs BP 131/81 (BP Location: Left Arm)   Pulse 73   Temp 98.2 F (36.8 C) (Oral)   Resp 20   SpO2 100%   Physical Exam Vitals signs and nursing note reviewed.  Constitutional:      General: She is not in  acute distress.    Appearance: She is well-developed. She is not diaphoretic.  HENT:     Head: Normocephalic and atraumatic.  Neck:     Musculoskeletal: Normal range of motion and neck supple.  Cardiovascular:     Rate and Rhythm: Normal rate and regular rhythm.  Heart sounds: No murmur. No friction rub. No gallop.   Pulmonary:     Effort: Pulmonary effort is normal. No respiratory distress.     Breath sounds: Normal breath sounds. No wheezing.  Abdominal:     General: Bowel sounds are normal. There is no distension.     Palpations: Abdomen is soft.     Tenderness: There is abdominal tenderness in the right lower quadrant. There is no right CVA tenderness, left CVA tenderness, guarding or rebound.  Musculoskeletal: Normal range of motion.  Skin:    General: Skin is warm and dry.  Neurological:     Mental Status: She is alert and oriented to person, place, and time.      ED Treatments / Results  Labs (all labs ordered are listed, but only abnormal results are displayed) Labs Reviewed  COMPREHENSIVE METABOLIC PANEL - Abnormal; Notable for the following components:      Result Value   Glucose, Bld 104 (*)    All other components within normal limits  CBC - Abnormal; Notable for the following components:   WBC 13.0 (*)    RBC 5.55 (*)    HCT 47.0 (*)    All other components within normal limits  I-STAT BETA HCG BLOOD, ED ([facility], [facility], [facility] ONLY) - Abnormal; Notable for the following components:   I-stat hCG, quantitative 56.8 (*)    All other components within normal limits  LIPASE, BLOOD  URINALYSIS, ROUTINE W REFLEX MICROSCOPIC  HCG, SERUM, QUALITATIVE    EKG None  Radiology Ct Abdomen Pelvis W Contrast  Result Date: 09/27/2018 CLINICAL DATA:  33 year old female with epigastric abdominal pain, persistent since July 13th. EXAM: CT ABDOMEN AND PELVIS WITH CONTRAST TECHNIQUE: Multidetector CT imaging of the abdomen and pelvis was performed using the standard protocol  following bolus administration of intravenous contrast. CONTRAST:  132mL OMNIPAQUE IOHEXOL 300 MG/ML  SOLN COMPARISON:  CT Abdomen and Pelvis 01/25/2016 [facility] FINDINGS: Lower chest: Negative. Hepatobiliary: Surgically absent gallbladder. Negative liver. Pancreas: Negative. Spleen: Negative. Adrenals/Urinary Tract: Normal adrenal glands. Symmetric and normal bilateral renal enhancement. No perinephric stranding or hydronephrosis. Normal proximal ureters. Unremarkable urinary bladder. Occasional pelvic phleboliths. Stomach/Bowel: Negative descending and rectosigmoid colon. Negative transverse and right colon aside from some retained stool. Normal appendix visible on coronal image 51. Negative terminal ileum. Oral contrast has not yet reached the distal small bowel. No dilated or abnormal small bowel loops. Decompressed and negative stomach, duodenum. No free air, free fluid. Vascular/Lymphatic: Suboptimal intravascular contrast bolus but the major arterial structures appear patent and normal. Portal venous system appears patent. There are venous collaterals partially visible in the anterior upper thighs, more so on the right (series 2, image 99). But the central pelvic veins and IVC appear to be patent. No lymphadenopathy. Reproductive: Stable, IUD remains in place. Other: No pelvic free fluid. Musculoskeletal: Stable endplate and facet degeneration at the lumbosacral junction. Otherwise negative. IMPRESSION: 1. No acute or inflammatory process identified in the abdomen or pelvis. Normal appendix. 2. Partially visible chronic venous collaterals in the anterior upper thighs, more so on the right. The major vascular structures in the abdomen and pelvis appear patent. 3. Chronic IUD. Electronically Signed   By: Genevie Ann M.D.   On: 09/27/2018 21:38    Procedures Procedures (including critical care time)  Medications Ordered in ED Medications  sodium chloride flush (NS)  0.9 % injection 3 mL (has no administration in time range)  sodium chloride  0.9 % bolus 1,000 mL (has no administration in time range)  ondansetron (ZOFRAN) injection 4 mg (has no administration in time range)  morphine 4 MG/ML injection 4 mg (has no administration in time range)     Initial Impression / Assessment and Plan / ED Course  I have reviewed the triage vital signs and the nursing notes.  Pertinent labs & imaging results that were available during my care of the patient were reviewed by me and considered in my medical decision making (see chart for details).  Patient presenting here with complaints of ongoing right sided abdominal pain.  She has been seen by her primary doctor, urgent care, and no causes been found.  CT scan 2 days ago was negative.  Patient now has a white count of 13,000.  CT scan was repeated and remains negative for appendicitis.  There is no other pathology identified that would explain her pain.  At this point, patient appears appropriate for discharge.  I feel as though an emergent cause has been excluded.  Patient can follow-up with GI if not improving in the next few days.  She will be given medicine for her pain and nausea and advised to follow-up if not improving.  Final Clinical Impressions(s) / ED Diagnoses   Final diagnoses:  None    ED Discharge Orders    None       Veryl Speak, MD 09/29/18 2335

## 2018-09-29 NOTE — ED Notes (Signed)
Pt stated she is not able to provide urine sample at this time.

## 2018-09-29 NOTE — Discharge Instructions (Signed)
Hydrocodone as prescribed as needed for pain.  Zofran as prescribed as needed for nausea.  Follow-up with gastroenterology if your symptoms or not improving.  The contact information for the [facility] has been provided in this discharge summary for you to call and make these arrangements.

## 2018-09-29 NOTE — ED Triage Notes (Signed)
Per pt she has been having right lower quadrant pain since last Friday. Seen pcp and went ed in [place] was placed on medication. Pt said not helping. Pt said CT scan was done on Monday. Pt said pain has gotten severe. Pt was placed on antibiotics for UTI. Does not hurt with urination. No burning.

## 2018-09-30 NOTE — ED Notes (Signed)
Patient verbalizes understanding of discharge instructions. Opportunity for questioning and answers were provided. Armband removed by staff, pt discharged from ED.  

## 2018-10-02 NOTE — Telephone Encounter (Signed)
Please schedule this patient for a visit with me tomorrow, this needs to be evaluated in person.  She did have leukocytes in her urine, elevated WBC count, does not look like antibiotics were prescribed, with right abdominal pain we also need to consider biliary colic, I would like to see her in person, examine her, and come up with a good plan.

## 2018-10-03 ENCOUNTER — Other Ambulatory Visit: Payer: Self-pay

## 2018-10-03 ENCOUNTER — Ambulatory Visit (INDEPENDENT_AMBULATORY_CARE_PROVIDER_SITE_OTHER): Payer: BC Managed Care – PPO | Admitting: Sports Medicine

## 2018-10-03 ENCOUNTER — Encounter: Payer: Self-pay | Admitting: Sports Medicine

## 2018-10-03 DIAGNOSIS — R1031 Right lower quadrant pain: Secondary | ICD-10-CM

## 2018-10-03 MED ORDER — PREDNISONE 50 MG PO TABS: ORAL_TABLET | ORAL | 0 refills | Status: DC

## 2018-10-03 MED ORDER — METRONIDAZOLE 500 MG PO TABS: 500.0000 mg | ORAL_TABLET | Freq: Two times a day (BID) | ORAL | 0 refills | Status: DC

## 2018-10-03 NOTE — Assessment & Plan Note (Addendum)
Has had a couple of CT scans both of which were normal, I went over the images with the radiologist. Family history of Crohn's disease, I am curious as to whether she has IBD herself. She did have an elevated white count in the ED, as well as a few episodes of hematochezia, explosive diarrhea. Adding a repeat set of labs, CBC with differential, CMP, amylase, lipase, IBD panel, ESR, CRP, stool studies including C. difficile. Adding metronidazole, 5 days of prednisone. She has an appointment with gastroenterology on Thursday. Out of work for a week.  Saccharomyces cerevisiae antibodies are elevated, these are typically elevated in Crohn's disease along with CRP, I think we have found the diagnosis but she still needs GI referral for scope/biopsy.

## 2018-10-03 NOTE — Progress Notes (Addendum)
Subjective:    CC: Abdominal pain  HPI: Ann Brock has been through a lot, she has had a great deal of right lower quadrant abdominal pain, she has been treated appropriately for a urinary source with ciprofloxacin without any improvement, she did have leukocytes and bacteria in her urine.  Ultimately she went to the emergency department a couple of times, labs were negative with the exception of leukocytosis, she had 2 CT scans, these were both negative for anything acute.  She continues to have right lower quadrant abdominal pain with occasional episodes of explosive diarrhea, she has had a few episodes of hematochezia as well.  Moderate nausea, she does tell me that her father has Crohn's disease.  I reviewed the past medical history, family history, social history, surgical history, and allergies today and no changes were needed.  Please see the problem list section below in epic for further details.  Past Medical History: Past Medical History:  Diagnosis Date  . Anxiety   . Chest pain    2014  . Depression   . Ectopic pregnancy    treated with methotrexate  . GERD (gastroesophageal reflux disease)   . Hyperemesis gravidarum   . Hypertension    prior to preg, no meds  . Infection    UTI  . Vaginal Pap smear, abnormal    repeat was normal   Past Surgical History: Past Surgical History:  Procedure Laterality Date  . CESAREAN SECTION    . CESAREAN SECTION N/A 08/05/2015   Procedure: REPEAT CESAREAN SECTION ;  Surgeon: Donnamae Jude, MD;  Location: [place];  Service: Obstetrics;  Laterality: N/A;  . CHOLECYSTECTOMY    . KNEE SURGERY     3 knee surgerys  . TONSILLECTOMY AND ADENOIDECTOMY     Social History: Social History   Socioeconomic History  . Marital status: Married    Spouse name: Not on file  . Number of children: Not on file  . Years of education: Not on file  . Highest education level: Not on file  Occupational History  . Occupation: sheets  Social  Needs  . Financial resource strain: Not on file  . Food insecurity    Worry: Not on file    Inability: Not on file  . Transportation needs    Medical: Not on file    Non-medical: Not on file  Tobacco Use  . Smoking status: Former Research scientist (life sciences)  . Smokeless tobacco: Never Used  . Tobacco comment: quit  2006  Substance and Sexual Activity  . Alcohol use: No    Alcohol/week: 0.0 standard drinks    Comment: socially prior to pregnancy  . Drug use: No  . Sexual activity: Yes    Partners: Male    Birth control/protection: I.U.D.  Lifestyle  . Physical activity    Days per week: Not on file    Minutes per session: Not on file  . Stress: Not on file  Relationships  . Social Herbalist on phone: Not on file    Gets together: Not on file    Attends religious service: Not on file    Active member of club or organization: Not on file    Attends meetings of clubs or organizations: Not on file    Relationship status: Not on file  Other Topics Concern  . Not on file  Social History Narrative  . Not on file   Family History: Family History  Problem Relation Age of Onset  .  Hypertension Mother   . Depression Mother   . Drug abuse Father   . Heart disease Father   . Hyperlipidemia Father   . Hypertension Father   . Stroke Father   . Colon cancer Father 34  . Crohn's disease Father   . Cancer Maternal Grandmother   . Diabetes Maternal Grandmother   . Stroke Maternal Grandmother   . Cancer Maternal Grandfather   . Stroke Maternal Grandfather   . Alcohol abuse Paternal Grandmother   . Cancer Paternal Grandmother   . Stroke Paternal Grandmother   . Alcohol abuse Paternal Grandfather   . Cancer Paternal Grandfather   . Stroke Paternal Grandfather   . Cerebral palsy Sister   . Kidney disease Sister   . Colon polyps Sister   . Birth defects Brother        vsd   Allergies: Allergies  Allergen Reactions  . Aspirin Anaphylaxis  . Cephalosporins Anaphylaxis  . Contrast  Media [Iodinated Diagnostic Agents] Hives   Medications: See med rec.  Review of Systems: No fevers, chills, night sweats, weight loss, chest pain, or shortness of breath.   Objective:    General: Well Developed, well nourished, and in no acute distress.  Neuro: Alert and oriented x3, extra-ocular muscles intact, sensation grossly intact.  HEENT: Normocephalic, atraumatic, pupils equal round reactive to light, neck supple, no masses, no lymphadenopathy, thyroid nonpalpable.  Skin: Warm and dry, no rashes. Cardiac: Regular rate and rhythm, no murmurs rubs or gallops, no lower extremity edema.  Respiratory: Clear to auscultation bilaterally. Not using accessory muscles, speaking in full sentences. Abdomen: Soft, tender to palpation of the right lower quadrant, nondistended, normal bowel sounds, no palpable masses, no guarding, rigidity, rebound tenderness.  Impression and Recommendations:    Abdominal pain Has had a couple of CT scans both of which were normal, I went over the images with the radiologist. Family history of Crohn's disease, I am curious as to whether she has IBD herself. She did have an elevated white count in the ED, as well as a few episodes of hematochezia, explosive diarrhea. Adding a repeat set of labs, CBC with differential, CMP, amylase, lipase, IBD panel, ESR, CRP, stool studies including C. difficile. Adding metronidazole, 5 days of prednisone. She has an appointment with gastroenterology on Thursday. Out of work for a week.  Saccharomyces cerevisiae antibodies are elevated, these are typically elevated in Crohn's disease along with CRP, I think we have found the diagnosis but she still needs GI referral for scope/biopsy.   ___________________________________________ Gwen Her. Dianah Field, M.D., ABFM., CAQSM. Primary Care and Sports Medicine [facility]  Adjunct Professor of [place] [employer]  Medicine

## 2018-10-04 ENCOUNTER — Telehealth: Payer: Self-pay

## 2018-10-04 ENCOUNTER — Encounter: Payer: Self-pay | Admitting: Sports Medicine

## 2018-10-04 LAB — C. DIFFICILE GDH AND TOXIN A/B
GDH ANTIGEN: NOT DETECTED
MICRO NUMBER:: 688710
SPECIMEN QUALITY:: ADEQUATE
TOXIN A AND B: NOT DETECTED

## 2018-10-04 NOTE — Telephone Encounter (Signed)
Covid-19 screening questions   Do you now or have you had a fever in the last 14 days? No  Do you have any respiratory symptoms of shortness of breath or cough now or in the last 14 days? No  Do you have any family members or close contacts with diagnosed or suspected Covid-19 in the past 14 days? No  Have you been tested for Covid-19 and found to be positive? No        

## 2018-10-05 ENCOUNTER — Ambulatory Visit: Payer: BC Managed Care – PPO | Admitting: Gastroenterology

## 2018-10-05 ENCOUNTER — Encounter: Payer: Self-pay | Admitting: Gastroenterology

## 2018-10-05 VITALS — BP 110/60 | HR 75 | Temp 98.3°F | Ht 66.0 in | Wt 287.0 lb

## 2018-10-05 DIAGNOSIS — R197 Diarrhea, unspecified: Secondary | ICD-10-CM | POA: Diagnosis not present

## 2018-10-05 DIAGNOSIS — R1084 Generalized abdominal pain: Secondary | ICD-10-CM

## 2018-10-05 DIAGNOSIS — K625 Hemorrhage of anus and rectum: Secondary | ICD-10-CM

## 2018-10-05 LAB — TIQ-NTM

## 2018-10-05 MED ORDER — NA SULFATE-K SULFATE-MG SULF 17.5-3.13-1.6 GM/177ML PO SOLN: ORAL | 0 refills | Status: DC

## 2018-10-05 MED ORDER — DICYCLOMINE HCL 20 MG PO TABS: 20.0000 mg | ORAL_TABLET | Freq: Two times a day (BID) | ORAL | 1 refills | Status: DC

## 2018-10-05 NOTE — Progress Notes (Signed)
10/05/2018 Ann Brock [id number] 12-13-1985   HISTORY OF PRESENT ILLNESS: This is a 33 year old female who is new to our office.  She is here today with complaints of diarrhea, abdominal pain, and rectal bleeding.  It was suggested by her PCP and the ER to follow-up with GI, but I believe she was a self-referral to our practice.  She tells me that she has had some issues with diarrhea and abdominal pain after eating in the past, but she is felt very terrible for the past 2 weeks.  She complains of diarrhea sometimes 15 times per day.  She has nausea, occasional vomiting, and abdominal pain.  The abdominal pain is located in her mid abdomen, mostly to the right.  She reports that she has seen blood in her stool as well although has not been as much the last couple of days.  She tells me that her dad has Crohn's disease.  She has been in the ER on 2 occasions and has seen her family physician for these complaints as well.  Stool culture and stool for C. difficile are negative.  Celiac lab studies were normal.  CBC, CMP, amylase, lipase, sed rate were normal, however, CRP was elevated at 14.3 and she had a positive ASCA as well.  She has had 2 CT scans of the abdomen pelvis performed within the past 10 days.  One was performed with contrast the other was without contrast and they are both unremarkable for any cause of her symptoms.  She was treated with some antibiotics including Cipro and Flagyl.  She was also treated with prednisone and is currently on 50 mg, but has only been given a 5-day course on this occasion, but says that she had been given a few days worth previously as well.  She has noticed minimal improvement with the medication.   Past Medical History:  Diagnosis Date  . Anxiety   . Chest pain    2014  . Depression   . Ectopic pregnancy    treated with methotrexate  . GERD (gastroesophageal reflux disease)   . Hyperemesis gravidarum   . Hypertension    prior to preg, no  meds  . Infection    UTI  . Vaginal Pap smear, abnormal    repeat was normal   Past Surgical History:  Procedure Laterality Date  . CESAREAN SECTION    . CESAREAN SECTION N/A 08/05/2015   Procedure: REPEAT CESAREAN SECTION ;  Surgeon: Donnamae Jude, MD;  Location: [facility];  Service: Obstetrics;  Laterality: N/A;  . CHOLECYSTECTOMY    . KNEE SURGERY     3 knee surgerys  . TONSILLECTOMY AND ADENOIDECTOMY      reports that she has quit smoking. She has never used smokeless tobacco. She reports that she does not drink alcohol or use drugs. family history includes Alcohol abuse in her paternal grandfather and paternal grandmother; Birth defects in her brother; Cancer in her maternal grandfather, maternal grandmother, paternal grandfather, and paternal grandmother; Cerebral palsy in her sister; Colon cancer (age of onset: 95) in her father; Colon polyps in her sister; Crohn's disease in her father; Depression in her mother; Diabetes in her maternal grandmother; Drug abuse in her father; Heart disease in her father; Hyperlipidemia in her father; Hypertension in her father and mother; Kidney disease in her sister; Stroke in her father, maternal grandfather, maternal grandmother, paternal grandfather, and paternal grandmother. Allergies  Allergen Reactions  . Aspirin Anaphylaxis  . Cephalosporins  Anaphylaxis  . Contrast Media [Iodinated Diagnostic Agents] Hives      Outpatient Encounter Medications as of 10/05/2018  Medication Sig  . acetaminophen (TYLENOL) 325 MG tablet Take 650 mg by mouth every 6 (six) hours as needed for headache.   . busPIRone (BUSPAR) 15 MG tablet   . ciprofloxacin (CIPRO) 500 MG tablet Take 1 tablet (500 mg total) by mouth 2 (two) times daily.  Marland Kitchen HYDROcodone-acetaminophen (NORCO) 5-325 MG tablet Take 1-2 tablets by mouth every 6 (six) hours as needed.  . lamoTRIgine (LAMICTAL) 100 MG tablet 150 mg daily.   Marland Kitchen lamoTRIgine (LAMICTAL) 25 MG tablet 25 mg. See  Lamictal 100mg .  Pt takes 150mg  daily)  . omeprazole (PRILOSEC) 40 MG capsule Take 1 capsule (40 mg total) by mouth 2 (two) times daily.  . ondansetron (ZOFRAN ODT) 8 MG disintegrating tablet 8mg  ODT q4 hours prn nausea  . predniSONE (DELTASONE) 50 MG tablet One tab PO daily for 5 days. (Patient taking differently: Take 50 mg by mouth daily with breakfast. One tab PO daily for 5 days.)  . promethazine (PHENERGAN) 25 MG tablet Take 1 tablet (25 mg total) by mouth every 6 (six) hours as needed for nausea or vomiting.  . sucralfate (CARAFATE) 1 g tablet Take 1 tablet (1 g total) by mouth 4 (four) times daily.  . [DISCONTINUED] eszopiclone (LUNESTA) 2 MG TABS tablet   . [DISCONTINUED] hydrochlorothiazide (HYDRODIURIL) 25 MG tablet Take 1 tablet (25 mg total) by mouth daily.  . [DISCONTINUED] ketorolac (TORADOL) 10 MG tablet Take 1 tablet (10 mg total) by mouth every 6 (six) hours as needed.  . [DISCONTINUED] LORazepam (ATIVAN) 0.5 MG tablet   . [DISCONTINUED] metroNIDAZOLE (FLAGYL) 500 MG tablet Take 1 tablet (500 mg total) by mouth 2 (two) times daily for 7 days.   No facility-administered encounter medications on file as of 10/05/2018.      REVIEW OF SYSTEMS  : All other systems reviewed and negative except where noted in the History of Present Illness.   PHYSICAL EXAM: BP 110/60   Pulse 75   Temp 98.3 F (36.8 C)   Ht 5\' 6"  (1.676 m)   Wt 287 lb (130.2 kg)   BMI 46.32 kg/m  General: Well developed white female in no acute distress Head: Normocephalic and atraumatic Eyes:  Sclerae anicteric, conjunctiva pink. Ears: Normal auditory acuity Lungs: Clear throughout to auscultation; no increased WOB. Heart: Regular rate and rhythm; no M/R/G Abdomen: Soft, non-distended.  BS present.  RLQ TTP. Rectal:  Will be done at the time of colonoscopy. Musculoskeletal: Symmetrical with no gross deformities  Skin: No lesions on visible extremities Extremities: No edema  Neurological: Alert  oriented x 4, grossly non-focal Psychological:  Alert and cooperative. Normal mood and affect  ASSESSMENT AND PLAN: *33 year old female with complaints of diarrhea, rectal bleeding, and abdominal pain more so on the right side.  Has elevated CRP as well as positive ASCA.  Her father has Crohn's disease.  Symptoms worse over the past 2 weeks, but she has had issues with abdominal pain and diarrhea intermittently for quite some time.  Stool studies negative.  We will plan for colonoscopy with Dr. Havery Moros.  Will send prescription for Bentyl 20 mg twice daily to use in the interim and can also then use Imodium if needed as well.  **The risks, benefits, and alternatives to colonoscopy were discussed with the patient and she consents to proceed.    CC:  Gregor Hams, MD

## 2018-10-05 NOTE — Patient Instructions (Signed)
If you are age 31 or older, your body mass index should be between 23-30. Your Body mass index is 46.32 kg/m. If this is out of the aforementioned range listed, please consider follow up with your Primary Care Provider.  If you are age 48 or younger, your body mass index should be between 19-25. Your Body mass index is 46.32 kg/m. If this is out of the aformentioned range listed, please consider follow up with your Primary Care Provider.   You have been scheduled for a colonoscopy. Please follow written instructions given to you at your visit today.  Please pick up your prep supplies at the pharmacy within the next 1-3 days. If you use inhalers (even only as needed), please bring them with you on the day of your procedure. Your physician has requested that you go to www.startemmi.com and enter the access code given to you at your visit today. This web site gives a general overview about your procedure. However, you should still follow specific instructions given to you by our office regarding your preparation for the procedure.  We have sent the following medications to your pharmacy for you to pick up at your convenience: Suprep Bentyl 20 mg  Take Imodium as needed.  Thank you for choosing me and [facility].   Alonza Bogus, PA-C

## 2018-10-06 ENCOUNTER — Telehealth: Payer: Self-pay | Admitting: Gastroenterology

## 2018-10-06 ENCOUNTER — Encounter: Payer: Self-pay | Admitting: Gastroenterology

## 2018-10-06 DIAGNOSIS — R197 Diarrhea, unspecified: Secondary | ICD-10-CM | POA: Insufficient documentation

## 2018-10-06 DIAGNOSIS — K625 Hemorrhage of anus and rectum: Secondary | ICD-10-CM | POA: Insufficient documentation

## 2018-10-06 LAB — SACCHAROMYCES CEREVISIAE ANTIBODIES, IGG AND IGA
SACCHAROMYCES CEREVISIAE AB (ASCA)(IGA): 7.5 U (ref <=20.0)
SACCHAROMYCES CEREVISIAE AB (ASCA)(IGG): 34.7 U — ABNORMAL HIGH (ref <=20.0)

## 2018-10-06 LAB — SEDIMENTATION RATE: Sed Rate: 17 mm/h (ref 0–20)

## 2018-10-06 LAB — CBC WITH DIFFERENTIAL/PLATELET
Absolute Monocytes: 688 cells/uL (ref 200–950)
Basophils Absolute: 19 cells/uL (ref 0–200)
Basophils Relative: 0.2 %
Eosinophils Absolute: 93 cells/uL (ref 15–500)
Eosinophils Relative: 1 %
HCT: 42.3 % (ref 35.0–45.0)
Hemoglobin: 13.8 g/dL (ref 11.7–15.5)
Lymphs Abs: 2790 cells/uL (ref 850–3900)
MCH: 26.9 pg — ABNORMAL LOW (ref 27.0–33.0)
MCHC: 32.6 g/dL (ref 32.0–36.0)
MCV: 82.5 fL (ref 80.0–100.0)
MPV: 10.5 fL (ref 7.5–12.5)
Monocytes Relative: 7.4 %
Neutro Abs: 5710 cells/uL (ref 1500–7800)
Neutrophils Relative %: 61.4 %
Platelets: 249 10*3/uL (ref 140–400)
RBC: 5.13 10*6/uL — ABNORMAL HIGH (ref 3.80–5.10)
RDW: 13.5 % (ref 11.0–15.0)
Total Lymphocyte: 30 %
WBC: 9.3 10*3/uL (ref 3.8–10.8)

## 2018-10-06 LAB — C-REACTIVE PROTEIN: CRP: 14.3 mg/L — ABNORMAL HIGH (ref <8.0)

## 2018-10-06 LAB — COMPLETE METABOLIC PANEL WITH GFR
AG Ratio: 1.3 (calc) (ref 1.0–2.5)
ALT: 18 U/L (ref 6–29)
AST: 11 U/L (ref 10–30)
Albumin: 3.6 g/dL (ref 3.6–5.1)
Alkaline phosphatase (APISO): 68 U/L (ref 31–125)
BUN: 14 mg/dL (ref 7–25)
CO2: 31 mmol/L (ref 20–32)
Calcium: 8.7 mg/dL (ref 8.6–10.2)
Chloride: 106 mmol/L (ref 98–110)
Creat: 0.69 mg/dL (ref 0.50–1.10)
GFR, Est African American: 134 mL/min/{1.73_m2} (ref >=60)
GFR, Est Non African American: 116 mL/min/{1.73_m2} (ref >=60)
Globulin: 2.7 g/dL (calc) (ref 1.9–3.7)
Glucose, Bld: 92 mg/dL (ref 65–99)
Potassium: 4 mmol/L (ref 3.5–5.3)
Sodium: 141 mmol/L (ref 135–146)
Total Bilirubin: 0.2 mg/dL (ref 0.2–1.2)
Total Protein: 6.3 g/dL (ref 6.1–8.1)

## 2018-10-06 LAB — AMYLASE: Amylase: 29 U/L (ref 21–101)

## 2018-10-06 LAB — TISSUE TRANSGLUTAMINASE, IGA: (tTG) Ab, IgA: 1 U/mL

## 2018-10-06 LAB — TISSUE TRANSGLUTAMINASE, IGG: (tTG) Ab, IgG: 3 U/mL

## 2018-10-06 LAB — LIPASE: Lipase: 20 U/L (ref 7–60)

## 2018-10-06 LAB — GLIADIN ANTIBODIES, SERUM
Deamidated Gliadin Abs, IgG: 2 U
Gliadin IgA: 3 Units

## 2018-10-06 NOTE — Telephone Encounter (Signed)
No to all answer °

## 2018-10-06 NOTE — Telephone Encounter (Signed)

## 2018-10-07 LAB — STOOL CULTURE
MICRO NUMBER:: 689455
MICRO NUMBER:: 689456
MICRO NUMBER:: 689457
SHIGA RESULT:: NOT DETECTED
SPECIMEN QUALITY:: ADEQUATE
SPECIMEN QUALITY:: ADEQUATE
SPECIMEN QUALITY:: ADEQUATE

## 2018-10-08 NOTE — Progress Notes (Signed)
Agree with assessment and plan as outlined.  

## 2018-10-09 ENCOUNTER — Other Ambulatory Visit: Payer: Self-pay

## 2018-10-09 ENCOUNTER — Encounter: Payer: Self-pay | Admitting: Gastroenterology

## 2018-10-09 ENCOUNTER — Ambulatory Visit (AMBULATORY_SURGERY_CENTER): Payer: BC Managed Care – PPO | Admitting: Gastroenterology

## 2018-10-09 VITALS — BP 127/74 | HR 71 | Temp 98.4°F | Resp 19 | Ht 66.0 in | Wt 287.0 lb

## 2018-10-09 DIAGNOSIS — R1084 Generalized abdominal pain: Secondary | ICD-10-CM | POA: Diagnosis not present

## 2018-10-09 DIAGNOSIS — K529 Noninfective gastroenteritis and colitis, unspecified: Secondary | ICD-10-CM

## 2018-10-09 DIAGNOSIS — K625 Hemorrhage of anus and rectum: Secondary | ICD-10-CM | POA: Diagnosis not present

## 2018-10-09 DIAGNOSIS — K648 Other hemorrhoids: Secondary | ICD-10-CM

## 2018-10-09 MED ORDER — SODIUM CHLORIDE 0.9 % IV SOLN: 500.0000 mL | Freq: Once | INTRAVENOUS | Status: DC

## 2018-10-09 NOTE — Patient Instructions (Signed)
Thank you for allowing [place] to care for you today!  Await biopsy report, approximately 2 weeks by mail.  Resume previous diet and medications today.  Return to your normal activities tomorrow.     YOU HAD AN ENDOSCOPIC PROCEDURE TODAY AT [facility]:   Refer to the procedure report that was given to you for any specific questions about what was found during the examination.  If the procedure report does not answer your questions, please call your gastroenterologist to clarify.  If you requested that your care partner not be given the details of your procedure findings, then the procedure report has been included in a sealed envelope for you to review at your convenience later.  YOU SHOULD EXPECT: Some feelings of bloating in the abdomen. Passage of more gas than usual.  Walking can help get rid of the air that was put into your GI tract during the procedure and reduce the bloating. If you had a lower endoscopy (such as a colonoscopy or flexible sigmoidoscopy) you may notice spotting of blood in your stool or on the toilet paper. If you underwent a bowel prep for your procedure, you may not have a normal bowel movement for a few days.  Please Note:  You might notice some irritation and congestion in your nose or some drainage.  This is from the oxygen used during your procedure.  There is no need for concern and it should clear up in a day or so.  SYMPTOMS TO REPORT IMMEDIATELY:   Following lower endoscopy (colonoscopy or flexible sigmoidoscopy):  Excessive amounts of blood in the stool  Significant tenderness or worsening of abdominal pains  Swelling of the abdomen that is new, acute  Fever of 100F or higher    For urgent or emergent issues, a gastroenterologist can be reached at any hour by calling [phone].   DIET:  We do recommend a small meal at first, but then you may proceed to your regular diet.  Drink plenty of fluids but you should avoid alcoholic beverages  for 24 hours.  ACTIVITY:  You should plan to take it easy for the rest of today and you should NOT DRIVE or use heavy machinery until tomorrow (because of the sedation medicines used during the test).    FOLLOW UP: Our staff will call the number listed on your records 48-72 hours following your procedure to check on you and address any questions or concerns that you may have regarding the information given to you following your procedure. If we do not reach you, we will leave a message.  We will attempt to reach you two times.  During this call, we will ask if you have developed any symptoms of COVID 19. If you develop any symptoms (ie: fever, flu-like symptoms, shortness of breath, cough etc.) before then, please call [phone].  If you test positive for Covid 19 in the 2 weeks post procedure, please call and report this information to [place].    If any biopsies were taken you will be contacted by phone or by letter within the next 1-3 weeks.  Please call us at [phone] if you have not heard about the biopsies in 3 weeks.    SIGNATURES/CONFIDENTIALITY: You and/or your care partner have signed paperwork which will be entered into your electronic medical record.  These signatures attest to the fact that that the information above on your After Visit Summary has been reviewed and is understood.  Full responsibility of the  confidentiality of this discharge information lies with you and/or your care-partner. 

## 2018-10-09 NOTE — Progress Notes (Signed)
PT taken to PACU. Monitors in place. VSS. Report given to RN. 

## 2018-10-09 NOTE — Progress Notes (Signed)
Pt's states no medical or surgical changes since previsit or office visit.  JM temps and CW vitals

## 2018-10-09 NOTE — Op Note (Signed)
[place] Patient Name: Ann Brock Procedure Date: 10/09/2018 3:18 PM MRN: [id number] Endoscopist: Remo Lipps P. Havery Moros , MD Age: 33 Referring MD:  Date of Birth: [birth year] Gender: Female Account #: [id number] Procedure:                Colonoscopy Indications:              Chronic diarrhea, Rectal bleeding, abdominal pain,                            father with Crohn's disease, elevated CRP but no                            anemia, CT scan x 2 without obvious bowel                            inflammation Medicines:                Monitored Anesthesia Care Procedure:                Pre-Anesthesia Assessment:                           - Prior to the procedure, a History and Physical                            was performed, and patient medications and                            allergies were reviewed. The patient's tolerance of                            previous anesthesia was also reviewed. The risks                            and benefits of the procedure and the sedation                            options and risks were discussed with the patient.                            All questions were answered, and informed consent                            was obtained. Prior Anticoagulants: The patient has                            taken no previous anticoagulant or antiplatelet                            agents. ASA Grade Assessment: III - A patient with                            severe systemic disease. After reviewing the risks  and benefits, the patient was deemed in                            satisfactory condition to undergo the procedure.                           After obtaining informed consent, the colonoscope                            was passed under direct vision. Throughout the                            procedure, the patient's blood pressure, pulse, and                            oxygen saturations were monitored  continuously. The                            Colonoscope was introduced through the anus and                            advanced to the the terminal ileum, with                            identification of the appendiceal orifice and IC                            valve. The colonoscopy was performed without                            difficulty. The patient tolerated the procedure                            well. The quality of the bowel preparation was                            good. The terminal ileum, ileocecal valve,                            appendiceal orifice, and rectum were photographed. Scope In: 3:23:15 PM Scope Out: 3:39:28 PM Scope Withdrawal Time: 0 hours 13 minutes 25 seconds  Total Procedure Duration: 0 hours 16 minutes 13 seconds  Findings:                 The perianal and digital rectal examinations were                            normal.                           The terminal ileum appeared normal.                           Internal hemorrhoids were found during  retroflexion. The hemorrhoids were small.                           The exam was otherwise without abnormality. No                            obvious inflammatory changes appreciated.                           Biopsies for histology were taken with a cold                            forceps from the right colon, left colon and                            transverse colon for evaluation of microscopic                            colitis. Complications:            No immediate complications. Estimated blood loss:                            Minimal. Estimated Blood Loss:     Estimated blood loss was minimal. Impression:               - The examined portion of the ileum was normal.                           - Internal hemorrhoids.                           - The examination was otherwise normal.                           - Biopsies were taken with a cold forceps from the                             right colon, left colon and transverse colon for                            evaluation of microscopic colitis.                           NO evidence of Crohn's disease on this exam. Recommendation:           - Patient has a contact number available for                            emergencies. The signs and symptoms of potential                            delayed complications were discussed with the                            patient. Return to normal activities tomorrow.  Written discharge instructions were provided to the                            patient.                           - Resume previous diet.                           - Continue present medications.                           - Await pathology results with further                            recommendations Remo Lipps P. Armbruster, MD 10/09/2018 3:44:58 PM This report has been signed electronically.

## 2018-10-09 NOTE — Progress Notes (Signed)
Called to room to assist during endoscopic procedure.  Patient ID and intended procedure confirmed with present staff. Received instructions for my participation in the procedure from the performing physician.  

## 2018-10-11 ENCOUNTER — Encounter: Payer: Self-pay | Admitting: Family Medicine

## 2018-10-11 ENCOUNTER — Telehealth: Payer: Self-pay

## 2018-10-11 NOTE — Telephone Encounter (Signed)
First post procedure follow up call, no answer 

## 2018-10-11 NOTE — Telephone Encounter (Signed)
  Follow up Call-  Call back number 10/09/2018  Post procedure Call Back phone  # [phone]  Permission to leave phone message Yes  Some recent data might be hidden     Patient questions:  Do you have a fever, pain , or abdominal swelling? No. Pain Score  0 *  Have you tolerated food without any problems? Yes.    Have you been able to return to your normal activities? Yes.    Do you have any questions about your discharge instructions: Diet   No. Medications  No. Follow up visit  No.  Do you have questions or concerns about your Care? No.  Actions: * If pain score is 4 or above: No action needed, pain <4.  1. Have you developed a fever since your procedure? no  2.   Have you had an respiratory symptoms (SOB or cough) since your procedure? no  3.   Have you tested positive for COVID 19 since your procedure no  4.   Have you had any family members/close contacts diagnosed with the COVID 19 since your procedure?  no   If yes to any of these questions please route to Joylene John, RN and Alphonsa Gin, Therapist, sports.

## 2018-10-11 NOTE — Telephone Encounter (Signed)
To PCP

## 2018-10-12 NOTE — Telephone Encounter (Signed)
Sending note to [facility] as an FYI- she is on schedule for MyChart visit tomorrow

## 2018-10-13 ENCOUNTER — Other Ambulatory Visit: Payer: Self-pay

## 2018-10-13 ENCOUNTER — Telehealth (INDEPENDENT_AMBULATORY_CARE_PROVIDER_SITE_OTHER): Payer: Self-pay | Admitting: Physician Assistant

## 2018-10-13 ENCOUNTER — Other Ambulatory Visit (INDEPENDENT_AMBULATORY_CARE_PROVIDER_SITE_OTHER): Payer: BC Managed Care – PPO

## 2018-10-13 VITALS — Ht 66.0 in | Wt 287.0 lb

## 2018-10-13 DIAGNOSIS — Z5329 Procedure and treatment not carried out because of patient's decision for other reasons: Secondary | ICD-10-CM

## 2018-10-13 DIAGNOSIS — R1084 Generalized abdominal pain: Secondary | ICD-10-CM | POA: Diagnosis not present

## 2018-10-13 DIAGNOSIS — Z91199 Patient's noncompliance with other medical treatment and regimen due to unspecified reason: Secondary | ICD-10-CM

## 2018-10-13 LAB — IGA: IgA: 163 mg/dL (ref 68–378)

## 2018-10-13 MED ORDER — COLESTIPOL HCL 1 G PO TABS: 1.0000 g | ORAL_TABLET | Freq: Two times a day (BID) | ORAL | 1 refills | Status: DC

## 2018-10-13 NOTE — Progress Notes (Signed)
Discuss colonoscopy results vs blood work.   SACCHAROMYCES CEREVISIAE AB (ASCA)(IGG) 34.7  Colonoscopy Impression:                - The examined portion of the ileum was normal. - Internal hemorrhoids. - The examination was otherwise normal. - Biopsies were taken with a cold forceps from the  right colon, left colon and transverse colon for  evaluation of microscopic colitis.  NO evidence of Crohn's disease on this exam.  Pt did not answer call for appt.   I mycharted GI note resulted today.   Notes recorded by Yetta Flock, MD on 10/13/2018 at 7:57 AM EDT  Sherlynn Stalls can you please relay the following:  - colonoscopy NORMAL, with NORMAL biopsies  - NO evidence of Crohn's based on colonoscopy and CT scans thus far, I think IBD is unlikely at this point, please reassure her  - it's possible she has IBS. She should continue bentyl if that helps. Otherwise given her history of cholecystectomy I'd like to try her on colestid 1gm BID to see if that helps  - may consider TCA pending her course  - I'd like to see her back in clinic next month for reassessment / follow up   Thanks

## 2018-10-17 ENCOUNTER — Telehealth: Payer: Self-pay | Admitting: Family Medicine

## 2018-10-17 NOTE — Telephone Encounter (Signed)
Completed FMLA form.  Will fax off today's in the scan and keep a hard copy in case we need to modify it.  I was patient feeling now?  It is been a while since I have seen her and I know that she is had a lot of evaluation since.

## 2018-10-18 MED ORDER — VIBERZI 100 MG PO TABS: 100.0000 mg | ORAL_TABLET | Freq: Two times a day (BID) | ORAL | 5 refills | Status: DC

## 2018-10-18 NOTE — Addendum Note (Signed)
Addended by: Gregor Hams on: 10/18/2018 12:31 PM   Modules accepted: Orders

## 2018-10-19 ENCOUNTER — Telehealth: Payer: Self-pay | Admitting: Family Medicine

## 2018-10-19 NOTE — Telephone Encounter (Signed)
Received fax from [employer] that [employer] requires a PA. Information has been sent to the insurance company. Awaiting determination.   

## 2018-10-23 ENCOUNTER — Encounter: Payer: Self-pay | Admitting: Family Medicine

## 2018-10-25 ENCOUNTER — Encounter: Payer: Self-pay | Admitting: Sports Medicine

## 2018-10-25 ENCOUNTER — Telehealth: Payer: Self-pay

## 2018-10-25 NOTE — Telephone Encounter (Signed)
Received a fax from insurance that this is not covered under patient plan. A peer to peer is requested at the number is [phone]. Please advise.

## 2018-10-25 NOTE — Telephone Encounter (Signed)
To PCP

## 2018-10-25 NOTE — Telephone Encounter (Signed)
Called patient to clarify note needed and she has a My Chart request to her PCP and GI for the same thing. She said her PCP had already given her a return to work note, but they needed a "date to return" on it. I asked her to get back with her PCP since they had given her the original note. She agreed

## 2018-10-26 ENCOUNTER — Encounter: Payer: Self-pay | Admitting: Family Medicine

## 2018-10-26 NOTE — Telephone Encounter (Signed)
She says that she wants to hold off on this medication anyway.  We will reassess in future if needed

## 2018-11-15 ENCOUNTER — Other Ambulatory Visit: Payer: Self-pay

## 2018-11-15 ENCOUNTER — Other Ambulatory Visit (INDEPENDENT_AMBULATORY_CARE_PROVIDER_SITE_OTHER): Payer: BC Managed Care – PPO

## 2018-11-15 ENCOUNTER — Encounter: Payer: Self-pay | Admitting: Gastroenterology

## 2018-11-15 ENCOUNTER — Ambulatory Visit: Payer: BC Managed Care – PPO | Admitting: Gastroenterology

## 2018-11-15 VITALS — BP 124/80 | HR 80 | Temp 98.1°F | Ht 66.0 in | Wt 297.6 lb

## 2018-11-15 DIAGNOSIS — K219 Gastro-esophageal reflux disease without esophagitis: Secondary | ICD-10-CM

## 2018-11-15 DIAGNOSIS — K529 Noninfective gastroenteritis and colitis, unspecified: Secondary | ICD-10-CM | POA: Diagnosis not present

## 2018-11-15 DIAGNOSIS — R110 Nausea: Secondary | ICD-10-CM

## 2018-11-15 DIAGNOSIS — R109 Unspecified abdominal pain: Secondary | ICD-10-CM | POA: Diagnosis not present

## 2018-11-15 LAB — TSH: TSH: 1.91 u[IU]/mL (ref 0.35–4.50)

## 2018-11-15 MED ORDER — ONDANSETRON 8 MG PO TBDP: 8.0000 mg | ORAL_TABLET | Freq: Three times a day (TID) | ORAL | 1 refills | Status: DC | PRN

## 2018-11-15 MED ORDER — PANTOPRAZOLE SODIUM 40 MG PO TBEC: 40.0000 mg | DELAYED_RELEASE_TABLET | Freq: Two times a day (BID) | ORAL | 2 refills | Status: DC

## 2018-11-15 MED ORDER — RIFAXIMIN 550 MG PO TABS: 550.0000 mg | ORAL_TABLET | Freq: Three times a day (TID) | ORAL | 0 refills | Status: DC

## 2018-11-15 MED ORDER — ONDANSETRON 8 MG PO TBDP: 8.0000 mg | ORAL_TABLET | Freq: Three times a day (TID) | ORAL | 0 refills | Status: DC | PRN

## 2018-11-15 MED ORDER — COLESTIPOL HCL 1 G PO TABS: 2.0000 g | ORAL_TABLET | Freq: Two times a day (BID) | ORAL | 1 refills | Status: DC

## 2018-11-15 NOTE — Progress Notes (Signed)
HPI :  33 year old female here for follow-up visit.  She was seen in our office in July for persistent diarrhea.  She has a strong family history of Crohn's disease.  She has had negative stool testing, negative lab test for celiac disease.  She had an elevated CRP but normal ESR.  She has had 2 CT scans of the abdomen and pelvis which were unremarkable for any cause of her symptoms.  She was given empiric Cipro and Flagyl and a short course of steroids with prednisone, none of which has helped.  We performed a colonoscopy on October 09, 2018.  Her colon and ileum were normal.  Biopsies were negative for microscopic colitis.  In light of her cholecystectomy we started her on Colestid 1 g twice a day.  She has been taking it but does not think it is made considerable improvement.  Sounds like her primary care physician prescribed Viberzi however the pharmacist noticed she did not have her gallbladder and she never started it.  She states she continues to have loose and watery stools, numerous times per day, up to hours of 15 times a day at max.  She has left-sided abdominal discomfort which is related to bowel movements, and sometimes relieved with a bowel movement.  She has occasional blood in her stool she attributes to hemorrhoids.  She has a bowel movement within 20 to 30 minutes of eating.  Her gallbladder was removed in 2012.  She states symptoms have been going on for 3 to 4 years at this point.  She has a lot of gas and bloating which bothers her.  She is using Imodium which does not help too much.  She is tried some Bentyl which does not help too much.  Some is significant reflux symptoms that bother her.  She has a lot of pyrosis and regurgitation.  She is taking omeprazole 40 mg twice daily and Pepcid twice a daily as well as Tums and Carafate.  She is having to elevate the head of the bed to minimize symptoms at night.  She denies any dysphasia.  She is prescribed Zofran to help with some nausea  she has at baseline, this helps her somewhat but she ran out of it.  She is not using Phenergan as she states it does not help much.  Colonoscopy 10/09/18 - normal ileum and colon, hemorrhoids - biopsies negative for MC.     Past Medical History:  Diagnosis Date   Anxiety    Chest pain    2014   Depression    Ectopic pregnancy    treated with methotrexate   GERD (gastroesophageal reflux disease)    Hyperemesis gravidarum    Hypertension    prior to preg, no meds   Infection    UTI   Vaginal Pap smear, abnormal    repeat was normal     Past Surgical History:  Procedure Laterality Date   CESAREAN SECTION     CESAREAN SECTION N/A 08/05/2015   Procedure: REPEAT CESAREAN SECTION ;  Surgeon: Donnamae Jude, MD;  Location: [place];  Service: Obstetrics;  Laterality: N/A;   CHOLECYSTECTOMY     KNEE SURGERY     3 knee surgerys   TONSILLECTOMY AND ADENOIDECTOMY     Family History  Problem Relation Age of Onset   Hypertension Mother    Depression Mother    Drug abuse Father    Heart disease Father    Hyperlipidemia Father  Hypertension Father    Stroke Father    Colon cancer Father 51   Crohn's disease Father    Cancer Maternal Grandmother    Diabetes Maternal Grandmother    Stroke Maternal Grandmother    Cancer Maternal Grandfather    Stroke Maternal Grandfather    Alcohol abuse Paternal Grandmother    Cancer Paternal Grandmother    Stroke Paternal Grandmother    Alcohol abuse Paternal Grandfather    Cancer Paternal Grandfather    Stroke Paternal Grandfather    Cerebral palsy Sister    Kidney disease Sister    Colon polyps Sister    Birth defects Brother        vsd   Rectal cancer Neg Hx    Social History   Tobacco Use   Smoking status: Former Smoker   Smokeless tobacco: Never Used   Tobacco comment: quit  2006  Substance Use Topics   Alcohol use: No    Alcohol/week: 0.0 standard drinks    Comment:  socially prior to pregnancy   Drug use: No   Current Outpatient Medications  Medication Sig Dispense Refill   acetaminophen (TYLENOL) 325 MG tablet Take 650 mg by mouth every 6 (six) hours as needed for headache.      busPIRone (BUSPAR) 15 MG tablet      colestipol (COLESTID) 1 g tablet Take 2 tablets (2 g total) by mouth 2 (two) times daily. 60 tablet 1   dicyclomine (BENTYL) 20 MG tablet Take 1 tablet (20 mg total) by mouth 2 (two) times a day. 100 tablet 1   HYDROcodone-acetaminophen (NORCO) 5-325 MG tablet Take 1-2 tablets by mouth every 6 (six) hours as needed. 12 tablet 0   lamoTRIgine (LAMICTAL) 100 MG tablet 150 mg daily.      lamoTRIgine (LAMICTAL) 25 MG tablet 25 mg. See Lamictal 162m.  Pt takes 1537mdaily)     promethazine (PHENERGAN) 25 MG tablet Take 1 tablet (25 mg total) by mouth every 6 (six) hours as needed for nausea or vomiting. 30 tablet 2   sucralfate (CARAFATE) 1 g tablet Take 1 tablet (1 g total) by mouth 4 (four) times daily. 120 tablet 0   ondansetron (ZOFRAN ODT) 8 MG disintegrating tablet Take 1 tablet (8 mg total) by mouth every 8 (eight) hours as needed for nausea. 90 tablet 1   pantoprazole (PROTONIX) 40 MG tablet Take 1 tablet (40 mg total) by mouth 2 (two) times daily. 60 tablet 2   rifaximin (XIFAXAN) 550 MG TABS tablet Take 1 tablet (550 mg total) by mouth 3 (three) times daily. Sample Lot #: [id number] Date: 12-2020 14 tablet 0   No current facility-administered medications for this visit.    Allergies  Allergen Reactions   Aspirin Anaphylaxis   Cephalosporins Anaphylaxis   Contrast Media [Iodinated Diagnostic Agents] Hives     Review of Systems: All systems reviewed and negative except where noted in HPI.    Lab Results  Component Value Date   WBC 9.3 10/03/2018   HGB 13.8 10/03/2018   HCT 42.3 10/03/2018   MCV 82.5 10/03/2018   PLT 249 10/03/2018    Lab Results  Component Value Date   CREATININE 0.69 10/03/2018     BUN 14 10/03/2018   NA 141 10/03/2018   K 4.0 10/03/2018   CL 106 10/03/2018   CO2 31 10/03/2018    Lab Results  Component Value Date   ALT 18 10/03/2018   AST 11 10/03/2018   ALKPHOS  87 09/29/2018   BILITOT 0.2 10/03/2018    Lab Results  Component Value Date   TSH 1.91 11/15/2018    Physical Exam: BP 124/80    Pulse 80    Temp 98.1 F (36.7 C)    Ht '5\' 6"'  (1.676 m)    Wt 297 lb 9.6 oz (135 kg)    BMI 48.03 kg/m  Constitutional: Pleasant,well-developed, female in no acute distress. HEENT: Normocephalic and atraumatic. Conjunctivae are normal. No scleral icterus. Neck supple.  Cardiovascular: Normal rate, regular rhythm.  Pulmonary/chest: Effort normal and breath sounds normal. No wheezing, rales or rhonchi. Abdominal: Soft, protuberant, nontender.  There are no masses palpable. No hepatomegaly. Extremities: no edema Lymphadenopathy: No cervical adenopathy noted. Neurological: Alert and oriented to person place and time. Skin: Skin is warm and dry. No rashes noted. Psychiatric: Normal mood and affect. Behavior is normal.   ASSESSMENT AND PLAN: 33 year old female here for reassessment of the following issues:  Chronic diarrhea / abdominal pain - persistent diarrhea with crampy abdominal pain.  Colonoscopy negative for colitis/IBD/microscopic colitis.  Prior infectious testing negative.  CT scan x2 did not show any bowel wall thickening.  Symptoms now ongoing for 3 to 4 years and she has a normal hemoglobin and white blood cell count.  I reassured her based off colonoscopy and CT scan I think the likelihood for Crohn's disease at this point is quite low (she is very concerned about this possibility).  I suspect she may have a functional bowel disorder / IBS, and discussed treatment options.  Given her postcholecystectomy state I would like to increase her Colestid to 2 tabs twice a day.  I counseled her on a low FODMAP diet would like to see if that helps.  I will check a TSH  and a pancreatic fecal elastase to ensure normal.  We recommend a trial of rifaximin 550 mg 3 times daily for 2 weeks and see if that helps, especially in light of her significant gas and bloating.  Could consider a TCA based on her course.  She is not a candidate for Viberzi given her postcholecystectomy state.  We will see if frequent dosing of Zofran also helps as outlined below.  Will await labs and her course, she should contact me if no improvement on this regimen.  GERD / Chronic nausea - persistent reflux symptoms despite omeprazole 5m twice a day and Pepcid twice daily, as well as use of Tums and sucralfate.  In this light I am recommending an upper endoscopy to evaluate her upper tract, assess for large hiatal hernia, will also assess her small intestine light of her persistent diarrhea.  We discussed risk and benefits of the procedure and she agreed.  In the interim we will try stopping omeprazole and switch to Protonix 40 mg twice a day to see if she has any better response.  Otherwise recommend Zofran 8 mg ODT every 8 hours as needed for nausea.  Further recommendations pending results.  SCarolina Cellar MD [facility]

## 2018-11-15 NOTE — Patient Instructions (Addendum)
If you are age 31 or older, your body mass index should be between 23-30. Your Body mass index is 48.03 kg/m. If this is out of the aforementioned range listed, please consider follow up with your Primary Care Provider.  If you are age 64 or younger, your body mass index should be between 19-25. Your Body mass index is 48.03 kg/m. If this is out of the aformentioned range listed, please consider follow up with your Primary Care Provider.   To help prevent the possible spread of infection to our patients, communities, and staff; we will be implementing the following measures:  As of now we are not allowing any visitors/family members to accompany you to any upcoming appointments with [facility]. If you have any concerns about this please contact our office to discuss prior to the appointment.   Please go to the lab in the basement of our building to have lab work done as you leave today. Hit "B" for basement when you get on the elevator.  When the doors open the lab is on your left.  We will call you with the results. Thank you.   We have sent the following medications to your pharmacy for you to pick up at your convenience: Protonix 40 mg: Take twice a day Zofran 8 mg: Take every 8 hours as needed  Increase Colestid to 2 tablets twice a day  Stop omeprazole  We are giving you samples of Xifaxan 550mg : Take three times a day for 14 days  We are giving you a Low FOD-Map diet to follow.  Thank you for entrusting me with your care and for choosing [facility], Dr. [place] Cellar

## 2018-11-16 ENCOUNTER — Telehealth: Payer: Self-pay

## 2018-11-16 NOTE — Telephone Encounter (Signed)
Covid-19 screening questions   Do you now or have you had a fever in the last 14 days?  Do you have any respiratory symptoms of shortness of breath or cough now or in the last 14 days?  Do you have any family members or close contacts with diagnosed or suspected Covid-19 in the past 14 days?  Have you been tested for Covid-19 and found to be positive?       

## 2018-11-16 NOTE — Telephone Encounter (Signed)
Pt responded "no" to all screening questions °

## 2018-11-17 ENCOUNTER — Other Ambulatory Visit: Payer: Self-pay

## 2018-11-17 ENCOUNTER — Ambulatory Visit (AMBULATORY_SURGERY_CENTER): Payer: BC Managed Care – PPO | Admitting: Gastroenterology

## 2018-11-17 ENCOUNTER — Encounter: Payer: Self-pay | Admitting: Gastroenterology

## 2018-11-17 VITALS — BP 133/73 | HR 75 | Temp 97.5°F | Resp 11 | Ht 66.0 in | Wt 297.0 lb

## 2018-11-17 DIAGNOSIS — K2950 Unspecified chronic gastritis without bleeding: Secondary | ICD-10-CM | POA: Diagnosis not present

## 2018-11-17 DIAGNOSIS — R109 Unspecified abdominal pain: Secondary | ICD-10-CM

## 2018-11-17 DIAGNOSIS — K3189 Other diseases of stomach and duodenum: Secondary | ICD-10-CM

## 2018-11-17 DIAGNOSIS — K2970 Gastritis, unspecified, without bleeding: Secondary | ICD-10-CM

## 2018-11-17 DIAGNOSIS — K219 Gastro-esophageal reflux disease without esophagitis: Secondary | ICD-10-CM

## 2018-11-17 DIAGNOSIS — B9681 Helicobacter pylori [H. pylori] as the cause of diseases classified elsewhere: Secondary | ICD-10-CM

## 2018-11-17 DIAGNOSIS — K449 Diaphragmatic hernia without obstruction or gangrene: Secondary | ICD-10-CM | POA: Diagnosis not present

## 2018-11-17 DIAGNOSIS — K529 Noninfective gastroenteritis and colitis, unspecified: Secondary | ICD-10-CM

## 2018-11-17 DIAGNOSIS — R110 Nausea: Secondary | ICD-10-CM

## 2018-11-17 MED ORDER — SODIUM CHLORIDE 0.9 % IV SOLN: 500.0000 mL | Freq: Once | INTRAVENOUS | Status: DC

## 2018-11-17 NOTE — Progress Notes (Signed)
Called to room to assist during endoscopic procedure.  Patient ID and intended procedure confirmed with present staff. Received instructions for my participation in the procedure from the performing physician.  

## 2018-11-17 NOTE — Progress Notes (Signed)
A/ox3, pleased with MAC, report to RN 

## 2018-11-17 NOTE — Progress Notes (Signed)
History reviewed today  Temp J. Bullock VS C. [place] 

## 2018-11-17 NOTE — Op Note (Signed)
[place] Patient Name: Ann [place] Procedure Date: 11/17/2018 3:21 PM MRN: [id number] Endoscopist: Remo Lipps P. Havery Moros , MD Age: 33 Referring MD:  Date of Birth: March 06, 1986 Gender: Female Account #: [id number] Procedure:                Upper GI endoscopy Indications:              Abdominal pain, Follow-up of gastro-esophageal                            reflux disease, Diarrhea, Nausea - persistent                            symptoms despite omeprazole 40mg  twice daily,                            pepcid twice daily, carafate Medicines:                Monitored Anesthesia Care Procedure:                Pre-Anesthesia Assessment:                           - Prior to the procedure, a History and Physical                            was performed, and patient medications and                            allergies were reviewed. The patient's tolerance of                            previous anesthesia was also reviewed. The risks                            and benefits of the procedure and the sedation                            options and risks were discussed with the patient.                            All questions were answered, and informed consent                            was obtained. Prior Anticoagulants: The patient has                            taken no previous anticoagulant or antiplatelet                            agents. ASA Grade Assessment: III - A patient with                            severe systemic disease. After reviewing the risks  and benefits, the patient was deemed in                            satisfactory condition to undergo the procedure.                           After obtaining informed consent, the endoscope was                            passed under direct vision. Throughout the                            procedure, the patient's blood pressure, pulse, and                            oxygen saturations were  monitored continuously. The                            Endoscope was introduced through the mouth, and                            advanced to the second part of duodenum. The upper                            GI endoscopy was accomplished without difficulty.                            The patient tolerated the procedure well. Scope In: Scope Out: Findings:                 Esophagogastric landmarks were identified: the                            Z-line was found at 36 cm, the gastroesophageal                            junction was found at 36 cm and the upper extent of                            the gastric folds was found at 38 cm from the                            incisors.                           A 2 cm hiatal hernia was present.                           The exam of the esophagus was otherwise normal.                           The entire examined stomach was normal. Biopsies  were taken with a cold forceps for Helicobacter                            pylori testing.                           The duodenal bulb and second portion of the                            duodenum were normal. Biopsies for histology were                            taken with a cold forceps for evaluation of celiac                            disease. Complications:            No immediate complications. Estimated blood loss:                            Minimal. Estimated Blood Loss:     Estimated blood loss was minimal. Impression:               - Esophagogastric landmarks identified.                           - 2 cm hiatal hernia.                           - Normal esophagus - no gfross esophagitis or                            evidence of EoE. Patient has nonerosive reflux                            disease (NERD)                           - Normal stomach. Biopsied.                           - Normal duodenal bulb and second portion of the                            duodenum.  Biopsied. Recommendation:           - Patient has a contact number available for                            emergencies. The signs and symptoms of potential                            delayed complications were discussed with the                            patient. Return to normal activities tomorrow.  Written discharge instructions were provided to the                            patient.                           - Resume previous diet.                           - Continue present medications including trial of                            switching to protonix as previous prescribed                           - Await pathology results with further                            recommendations Remo Lipps P. Jyair Kiraly, MD 11/17/2018 3:45:54 PM This report has been signed electronically.

## 2018-11-17 NOTE — Patient Instructions (Signed)
  TRY A TRIAL OF PROTONIX AS PREVIOUSLY PRESCRIBED    YOU HAD AN ENDOSCOPIC PROCEDURE TODAY AT [facility]:   Refer to the procedure report that was given to you for any specific questions about what was found during the examination.  If the procedure report does not answer your questions, please call your gastroenterologist to clarify.  If you requested that your care partner not be given the details of your procedure findings, then the procedure report has been included in a sealed envelope for you to review at your convenience later.  YOU SHOULD EXPECT: Some feelings of bloating in the abdomen. Passage of more gas than usual.  Walking can help get rid of the air that was put into your GI tract during the procedure and reduce the bloating. If you had a lower endoscopy (such as a colonoscopy or flexible sigmoidoscopy) you may notice spotting of blood in your stool or on the toilet paper. If you underwent a bowel prep for your procedure, you may not have a normal bowel movement for a few days.  Please Note:  You might notice some irritation and congestion in your nose or some drainage.  This is from the oxygen used during your procedure.  There is no need for concern and it should clear up in a day or so.  SYMPTOMS TO REPORT IMMEDIATELY:   Following upper endoscopy (EGD)  Vomiting of blood or coffee ground material  New chest pain or pain under the shoulder blades  Painful or persistently difficult swallowing  New shortness of breath  Fever of 100F or higher  Black, tarry-looking stools  For urgent or emergent issues, a gastroenterologist can be reached at any hour by calling [phone].   DIET:  We do recommend a small meal at first, but then you may proceed to your regular diet.  Drink plenty of fluids but you should avoid alcoholic beverages for 24 hours.  ACTIVITY:  You should plan to take it easy for the rest of today and you should NOT DRIVE or use heavy  machinery until tomorrow (because of the sedation medicines used during the test).    FOLLOW UP: Our staff will call the number listed on your records 48-72 hours following your procedure to check on you and address any questions or concerns that you may have regarding the information given to you following your procedure. If we do not reach you, we will leave a message.  We will attempt to reach you two times.  During this call, we will ask if you have developed any symptoms of COVID 19. If you develop any symptoms (ie: fever, flu-like symptoms, shortness of breath, cough etc.) before then, please call [phone].  If you test positive for Covid 19 in the 2 weeks post procedure, please call and report this information to [place].    If any biopsies were taken you will be contacted by phone or by letter within the next 1-3 weeks.  Please call us at [phone] if you have not heard about the biopsies in 3 weeks.    SIGNATURES/CONFIDENTIALITY: You and/or your care partner have signed paperwork which will be entered into your electronic medical record.  These signatures attest to the fact that that the information above on your After Visit Summary has been reviewed and is understood.  Full responsibility of the confidentiality of this discharge information lies with you and/or your care-partner.

## 2018-11-22 ENCOUNTER — Other Ambulatory Visit: Payer: Self-pay | Admitting: Family Medicine

## 2018-11-22 ENCOUNTER — Telehealth: Payer: Self-pay

## 2018-11-22 NOTE — Telephone Encounter (Signed)
No answer, left message to call back later today, B.Ellana Kawa RN. 

## 2018-11-22 NOTE — Telephone Encounter (Signed)
Left message

## 2018-12-21 ENCOUNTER — Telehealth: Payer: BC Managed Care – PPO | Admitting: Physician Assistant

## 2018-12-21 DIAGNOSIS — R509 Fever, unspecified: Secondary | ICD-10-CM

## 2018-12-21 DIAGNOSIS — M7989 Other specified soft tissue disorders: Secondary | ICD-10-CM

## 2018-12-21 DIAGNOSIS — R52 Pain, unspecified: Secondary | ICD-10-CM

## 2018-12-21 NOTE — Progress Notes (Signed)
Hi Ann [place],  I am sorry you are not feeling well.  I am concerned about all of the symptoms you are experiencing.  While this could be COVID-19, I feel that other more concerning illnesses need to be ruled out first.  I'd like a medical provider to take a look at you.      Based on what you shared with me, I feel your condition warrants further evaluation and I recommend that you be seen for a face to face office visit.  NOTE: If you entered your credit card information for this eVisit, you will not be charged. You may see a "hold" on your card for the $35 but that hold will drop off and you will not have a charge processed.  If you are having a true medical emergency please call 911.     For an urgent face to face visit, [facility] has four urgent care centers for your convenience:   . [facility]    [phone]                  Get Driving Directions  [phone] [place], [place] [address] . 10 am to 8 pm Monday-Friday . 12 pm to 8 pm Saturday-Sunday   . [facility] at [place]                  Get Driving Directions  [phone] [place], [place], [place] [address] . 8 am to 8 pm Monday-Friday . 9 am to 6 pm Saturday . 11 am to 6 pm Sunday   . [facility] at [place]                  Get Driving Directions   [address] [place], [place] [address] . 8 am to 8 pm Monday-Friday . 8 am to 4 pm Saturday-Sunday    . [facility] at [place]                    Get Driving Directions  [id number]  [address], [place],  [address]  . Monday-Friday, 12 PM to 6 PM    Your e-visit answers were reviewed by a board certified advanced clinical practitioner to complete your personal care plan.  Thank you for using e-Visits.

## 2019-01-01 ENCOUNTER — Other Ambulatory Visit: Payer: Self-pay

## 2019-02-01 ENCOUNTER — Other Ambulatory Visit: Payer: BC Managed Care – PPO

## 2019-02-01 ENCOUNTER — Encounter: Payer: Self-pay | Admitting: Gastroenterology

## 2019-02-01 ENCOUNTER — Ambulatory Visit: Payer: BC Managed Care – PPO | Admitting: Gastroenterology

## 2019-02-01 VITALS — BP 130/80 | HR 85 | Ht 66.0 in | Wt 295.0 lb

## 2019-02-01 DIAGNOSIS — K219 Gastro-esophageal reflux disease without esophagitis: Secondary | ICD-10-CM | POA: Diagnosis not present

## 2019-02-01 DIAGNOSIS — R110 Nausea: Secondary | ICD-10-CM

## 2019-02-01 DIAGNOSIS — K529 Noninfective gastroenteritis and colitis, unspecified: Secondary | ICD-10-CM

## 2019-02-01 DIAGNOSIS — R6881 Early satiety: Secondary | ICD-10-CM

## 2019-02-01 MED ORDER — DEXILANT 60 MG PO CPDR: 60.0000 mg | DELAYED_RELEASE_CAPSULE | Freq: Every day | ORAL | 0 refills | Status: DC

## 2019-02-01 MED ORDER — DIPHENOXYLATE-ATROPINE 2.5-0.025 MG PO TABS: 1.0000 | ORAL_TABLET | Freq: Four times a day (QID) | ORAL | 2 refills | Status: DC | PRN

## 2019-02-01 NOTE — Patient Instructions (Addendum)
If you are age 31 or older, your body mass index should be between 23-30. Your Body mass index is 47.61 kg/m. If this is out of the aforementioned range listed, please consider follow up with your Primary Care Provider.  If you are age 1 or younger, your body mass index should be between 19-25. Your Body mass index is 47.61 kg/m. If this is out of the aformentioned range listed, please consider follow up with your Primary Care Provider.   To help prevent the possible spread of infection to our patients, communities, and staff; we will be implementing the following measures:  As of now we are not allowing any visitors/family members to accompany you to any upcoming appointments with [facility]. If you have any concerns about this please contact our office to discuss prior to the appointment.   Discontinue Protonix and Colestid.  We have given you samples of the following medication to take: Dexilant 60mg : Take once a day  We have sent the following medications to your pharmacy for you to pick up at your convenience: Lomotil: Take one tablet four times a day  Please go to the lab in the basement of our building to have lab work done as you leave today. Hit "B" for basement when you get on the elevator.  When the doors open the lab is on your left.  We will call you with the results. Thank you.   You have been scheduled for a gastric emptying scan at [facility] on Friday, December 11th at 9:30am. Please arrive at least 30 minutes prior to your appointment for registration. Please make certain not to have anything to eat or drink after midnight the night before your test. Hold all stomach medications (ex: Zofran, phenergan, Reglan) 8 hours prior to your test. If you need to reschedule your appointment, please contact radiology scheduling at [phone]. _____________________________________________________________________ A gastric-emptying study measures how long it takes  for food to move through your stomach. There are several ways to measure stomach emptying. In the most common test, you eat food that contains a small amount of radioactive material. A scanner that detects the movement of the radioactive material is placed over your abdomen to monitor the rate at which food leaves your stomach. This test normally takes about 4 hours to complete. _____________________________________________________________________  Thank you for entrusting me with your care and for choosing [facility], Dr. [place] Cellar      .

## 2019-02-01 NOTE — Progress Notes (Signed)
HPI :  33 year old female here for follow-up visit.  She is been followed for history of GERD and loose stools.  Regarding her reflux, she has had significant symptoms with pyrosis and regurgitation bothering her on a routine basis.  She needs to sleep with her head of her bed elevated as she has choking and significant pyrosis, water brash if she lies down.  Symptoms bother her significantly at night, also bothersome to her during the daytime.  She is frequently nauseated and does vomit periodically.  She has early satiety and feels like she eats less and her children and gets full quite easily.  She has switched from omeprazole to Protonix 40 mg twice a day, as well as taking liquid Carafate and Gaviscon.  She states these measures do help however she remains quite symptomatic.  Since I last saw her we performed an endoscopy in September which showed a small hiatal hernia, no evidence of reflux esophagitis.  She tested negative for H. pylori and negative celiac testing.  Regarding her loose stools, she had a colonoscopy in July 2020.  Her colon and ileum were normal, with biopsies negative for microscopic colitis.  She had negative testing for celiac disease.  She has a strong family history of Crohn's disease but no evidence of that based on her work-up today.  She has had 2 CT scans of the abdomen and pelvis which were unremarkable for any cause of her symptoms.  Given her history of cholecystectomy she was given Colestid 2 g twice daily for this, and gave her a course of rifaximin, she does not think it has made much difference to her at all.  She states she continues to have frequent urges to go to the bathroom, stool is usually loose and watery.  The more she goes she sometimes feels hemorrhoids bothering her.  She has tried to use upwards of 4 Imodium's a day which did not help reduce her stool frequency.  She has been on Lomotil in the past which did provide some benefit.  I had recommended testing  her for pancreatic exocrine deficiency, she submitted a stool sample at the lab lost and did not run it.  She has had prior negative infectious work-up  Generally she is not been doing well since of last seen her and ask about other options.  Her body mass index is 47.  Colonoscopy 10/09/18 - normal ileum and colon, hemorrhoids - biopsies negative for  [facility].  EGD 11/17/18 - Esophagogastric landmarks were identified: the Z-line was found at 36 cm, the gastroesophageal junction was found at 36 cm and the upper extent of the gastric folds was found at 38 cm from the incisors. Findings: - A 2 cm hiatal hernia was present. - The exam of the esophagus was otherwise normal. - The entire examined stomach was normal. Biopsies were taken with a cold forceps for Helicobacter pylori testing. - The duodenal bulb and second portion of the duodenum were normal. Biopsies for histology were taken with a cold forceps for evaluation of celiac disease.  Duodenal biopsies negative Gastric biopsies negative for HP, positive for GIM     Past Medical History:  Diagnosis Date   Anxiety    Chest pain    2014   Depression    Ectopic pregnancy    treated with methotrexate   GERD (gastroesophageal reflux disease)    Hyperemesis gravidarum    Hypertension    prior to preg, no meds   Infection  UTI   Vaginal Pap smear, abnormal    repeat was normal     Past Surgical History:  Procedure Laterality Date   CESAREAN SECTION     CESAREAN SECTION N/A 08/05/2015   Procedure: REPEAT CESAREAN SECTION ;  Surgeon: Donnamae Jude, MD;  Location: [facility];  Service: Obstetrics;  Laterality: N/A;   CHOLECYSTECTOMY     KNEE SURGERY     3 knee surgerys   TONSILLECTOMY AND ADENOIDECTOMY     Family History  Problem Relation Age of Onset   Hypertension Mother    Depression Mother    Drug abuse Father    Heart disease Father    Hyperlipidemia Father    Hypertension Father    Stroke  Father    Colon cancer Father 21   Crohn's disease Father    Cancer Maternal Grandmother    Diabetes Maternal Grandmother    Stroke Maternal Grandmother    Cancer Maternal Grandfather    Stroke Maternal Grandfather    Alcohol abuse Paternal Grandmother    Cancer Paternal Grandmother    Stroke Paternal Grandmother    Alcohol abuse Paternal Grandfather    Cancer Paternal Grandfather    Stroke Paternal Grandfather    Cerebral palsy Sister    Kidney disease Sister    Colon polyps Sister    Birth defects Brother        vsd   Rectal cancer Neg Hx    Esophageal cancer Neg Hx    Stomach cancer Neg Hx    Social History   Tobacco Use   Smoking status: Former Smoker   Smokeless tobacco: Never Used   Tobacco comment: quit  2006  Substance Use Topics   Alcohol use: No    Alcohol/week: 0.0 standard drinks    Comment: socially prior to pregnancy   Drug use: No   Current Outpatient Medications  Medication Sig Dispense Refill   acetaminophen (TYLENOL) 325 MG tablet Take 650 mg by mouth every 6 (six) hours as needed for headache.      busPIRone (BUSPAR) 15 MG tablet      colestipol (COLESTID) 1 g tablet Take 2 tablets (2 g total) by mouth 2 (two) times daily. 60 tablet 1   dicyclomine (BENTYL) 20 MG tablet Take 1 tablet (20 mg total) by mouth 2 (two) times a day. 100 tablet 1   escitalopram (LEXAPRO) 20 MG tablet Take 20 mg by mouth daily.     lamoTRIgine (LAMICTAL) 100 MG tablet 150 mg daily.      lamoTRIgine (LAMICTAL) 25 MG tablet 25 mg. See Lamictal 100mg .  Pt takes 150mg  daily)     ondansetron (ZOFRAN ODT) 8 MG disintegrating tablet Take 1 tablet (8 mg total) by mouth every 8 (eight) hours as needed for nausea. 90 tablet 1   pantoprazole (PROTONIX) 40 MG tablet Take 1 tablet (40 mg total) by mouth 2 (two) times daily. 60 tablet 2   promethazine (PHENERGAN) 25 MG tablet Take 1 tablet (25 mg total) by mouth every 6 (six) hours as needed for nausea or  vomiting. 30 tablet 2   sucralfate (CARAFATE) 1 g tablet Take 1 tablet by mouth 4 times daily 120 tablet 0   No current facility-administered medications for this visit.    Allergies  Allergen Reactions   Aspirin Anaphylaxis   Cephalosporins Anaphylaxis   Contrast Media [Iodinated Diagnostic Agents] Hives     Review of Systems: All systems reviewed and negative except where noted in HPI.  Lab Results  Component Value Date   WBC 9.3 10/03/2018   HGB 13.8 10/03/2018   HCT 42.3 10/03/2018   MCV 82.5 10/03/2018   PLT 249 10/03/2018    Lab Results  Component Value Date   CREATININE 0.69 10/03/2018   BUN 14 10/03/2018   NA 141 10/03/2018   K 4.0 10/03/2018   CL 106 10/03/2018   CO2 31 10/03/2018    Lab Results  Component Value Date   ALT 18 10/03/2018   AST 11 10/03/2018   ALKPHOS 87 09/29/2018   BILITOT 0.2 10/03/2018     Physical Exam: BP 130/80    Pulse 85    Ht 5\' 6"  (1.676 m)    Wt 295 lb (133.8 kg)    BMI 47.61 kg/m  Constitutional: Pleasant,well-developed, female in no acute distress. HEENT: Normocephalic and atraumatic. Conjunctivae are normal. No scleral icterus. Neck supple.  Cardiovascular: Normal rate, regular rhythm.  Pulmonary/chest: Effort normal and breath sounds normal. No wheezing, rales or rhonchi. Abdominal: Soft, protuberant, nontender. There are no masses palpable. No hepatomegaly. Extremities: no edema Lymphadenopathy: No cervical adenopathy noted. Neurological: Alert and oriented to person place and time. Skin: Skin is warm and dry. No rashes noted. Psychiatric: Normal mood and affect. Behavior is normal.   ASSESSMENT AND PLAN: 33 year old female here for reassessment of the following:  GERD / early satiety / chronic nausea - persistent severe reflux symptoms despite high-dose Protonix, Carafate, and Gaviscon, particularly at night.  Her EGD shows hiatal hernia but no esophagitis, she has non-erosive reflux disease.  Her symptoms  are certainly consistent with reflux, however with her early satiety and chronic nausea, I think we should make sure she does not have gastroparesis which is contributing to this.  I discussed what this is and recommend a gastric emptying study to further evaluate.  If this is positive we will address that.  If it is negative, I would recommend performing a pH impedance study to clarify and confirm she is having breakthrough reflux.  If she is having significant breakthrough reflux despite high-dose PPI, would then need to consider interventional modalities to treat her reflux.  Given her body mass index, she is not a candidate for endoscopic TIF or Nissen fundoplication, she would need to be considered for bariatric surgery such as Roux-en-Y which would probably help her reflux significantly.  We discussed the plan and she was agreeable to this.  We will await the gastric emptying study initially.  In the interim I had samples of Dexilant 60 mg once a day, will give this to her and she can stop the Protonix and see if this works any better for her.  I will get back with her with the results of the gastric emptying study with further recommendations.  She agreed  Chronic diarrhea - the patient has persistent loose stools with negative work-up to date including infectious work-up, celiac ruled out, no evidence of IBD or microscopic colitis on colonoscopy.  She submitted a stool study for pancreatic elastase which unfortunately was not run, I asked her to submit that again.  She thinks Lomotil has helped her more than anything she is tried so far, will give her Lomotil to use 4 times a day for now and see if it helps.  She can stop the Colestid, unfortunately this did not help her.  She is not a candidate for Viberzi given her postcholecystectomy state.  If her symptoms persist without clear cause may need to obtain stool electrolytes,  etc, consider trial of motofen. She agreed.   Itmann Cellar, MD [facility]  Gastroenterology

## 2019-02-15 ENCOUNTER — Other Ambulatory Visit: Payer: Self-pay

## 2019-02-15 ENCOUNTER — Telehealth: Payer: Self-pay | Admitting: Gastroenterology

## 2019-02-15 MED ORDER — HYDROCORTISONE ACETATE 25 MG RE SUPP: 25.0000 mg | Freq: Two times a day (BID) | RECTAL | 0 refills | Status: DC

## 2019-02-15 NOTE — Telephone Encounter (Signed)
Sorry to hear this. She has had a recent colonoscopy and I think symptoms are due to hemorrhoids.  We can try some Anusol PR once to twice daily if you can give her a 1 week supply. If she thinks she has had a lot of bleeding can check a CBC to ensure stable Hgb Continue lomotil She has a gastric emptying study pending in the next week or so and will await that result. Thanks

## 2019-02-15 NOTE — Telephone Encounter (Signed)
Called patient and let her know I had sent an order to her pharmacy for Anusol PR and gave instructions for taking. Asked her to continue lomotil. Offered to order a CBC if she felt she had a lot of bleeding. She wanted to hold off for now. I asked her to call us if she continues to feel dizzy or loses much more blood

## 2019-02-15 NOTE — Telephone Encounter (Signed)
Called patient back and she states for 4 days she has been having blood streaks in her stool and sometimes just yellow colored/acidy liquid stool that burns. She has been taking the Lomotil and her BMs are usually semi-formed to loose. She has also had a lot of burning in her throat with foamy/yellow emesis w/streaks of blood. She is taking Dexilant daily and OTC gaviscon. She states yesterday she had a lot of blood in her stool. Some bright red and some dark red. Said she also is dizzy. She called the ED and they told her to call us.

## 2019-02-23 ENCOUNTER — Encounter (HOSPITAL_COMMUNITY)
Admission: RE | Admit: 2019-02-23 | Discharge: 2019-02-23 | Disposition: A | Discharge status: Home / Self Care | Payer: BC Managed Care – PPO | Source: Ambulatory Visit | Attending: Gastroenterology | Admitting: Gastroenterology

## 2019-02-23 ENCOUNTER — Other Ambulatory Visit: Payer: Self-pay

## 2019-02-23 DIAGNOSIS — R6881 Early satiety: Secondary | ICD-10-CM

## 2019-02-23 MED ORDER — TECHNETIUM TC 99M SULFUR COLLOID
1.8000 | Freq: Once | INTRAVENOUS | Status: AC | PRN
  Administered 2019-02-23: 1.8 via ORAL

## 2019-02-26 ENCOUNTER — Telehealth: Payer: Self-pay

## 2019-02-26 NOTE — Telephone Encounter (Signed)
Left message to please call back. °

## 2019-03-12 ENCOUNTER — Telehealth: Payer: BC Managed Care – PPO | Admitting: Family

## 2019-03-12 DIAGNOSIS — H9201 Otalgia, right ear: Secondary | ICD-10-CM

## 2019-03-12 MED ORDER — DEXILANT 60 MG PO CPDR: 60.0000 mg | DELAYED_RELEASE_CAPSULE | Freq: Every day | ORAL | 0 refills | Status: DC

## 2019-03-12 MED ORDER — NEOMYCIN-POLYMYXIN-HC 3.5-10000-1 OT SOLN: 3.0000 [drp] | Freq: Four times a day (QID) | OTIC | 0 refills | Status: DC

## 2019-03-12 NOTE — Progress Notes (Signed)
E Visit for Swimmer's Ear  We are sorry that you are not feeling well. Here is how we plan to help!  Based on what you have shared with me it looks like you have swimmers ear. Swimmer's ear is a redness or swelling, irritation, or infection of your outer ear canal.  These symptoms usually occur within a few days of swimming.  Your ear canal is a tube that goes from the opening of the ear to the eardrum.  When water stays in your ear canal, germs can grow.  This is a painful condition that often happens to children and swimmers of all ages.  It is not contagious and oral antibiotics are not required to treat uncomplicated swimmer's ear.  The usual symptoms include: Itching inside the ear, Redness or a sense of swelling in the ear, Pain when the ear is tugged on when pressure is placed on the ear, Pus draining from the infected ear. and I have prescribed: Neomycin 0.35%, polymyxin B 10,000 units/mL, and hydrocortisone 0,5% otic solution 4 drops in affected ears four times a day for 7 days   In certain cases swimmer's ear may progress to a more serious bacterial infection of the middle or inner ear.  If you have a fever 102 and up and significantly worsening symptoms, this could indicate a more serious infection moving to the middle/inner and needs face to face evaluation in an office by a provider.  Your symptoms should improve over the next 3 days and should resolve in about 7 days.  HOME CARE:   Wash your hands frequently.  Do not place the tip of the bottle on your ear or touch it with your fingers.  You can take Acetominophen 650 mg every 4-6 hours as needed for pain.  If pain is severe or moderate, you can apply a heating pad (set on low) or hot water bottle (wrapped in a towel) to outer ear for 20 minutes.  This will also increase drainage.  Avoid ear plugs  Do not use Q-tips  After showers, help the water run out by tilting your head to one side.  GET HELP RIGHT AWAY IF:   Fever is  over 102.2 degrees.  You develop progressive ear pain or hearing loss.  Ear symptoms persist longer than 3 days after treatment.  MAKE SURE YOU:   Understand these instructions.  Will watch your condition.  Will get help right away if you are not doing well or get worse.  TO PREVENT SWIMMER'S EAR:  Use a bathing cap or custom fitted swim molds to keep your ears dry.  Towel off after swimming to dry your ears.  Tilt your head or pull your earlobes to allow the water to escape your ear canal.  If there is still water in your ears, consider using a hairdryer on the lowest setting.  Thank you for choosing an e-visit. Your e-visit answers were reviewed by a board certified advanced clinical practitioner to complete your personal care plan. Depending upon the condition, your plan could have included both over the counter or prescription medications. Please review your pharmacy choice. Be sure that the pharmacy you have chosen is open so that you can pick up your prescription now.  If there is a problem you may message your provider in MyChart to have the prescription routed to another pharmacy. Your safety is important to us. If you have drug allergies check your prescription carefully.  For the next 24 hours, you can use   MyChart to ask questions about today's visit, request a non-urgent call back, or ask for a work or school excuse from your e-visit provider. You will get an email in the next two days asking about your experience. I hope that your e-visit has been valuable and will speed your recovery.     Greater than 5 minutes, yet less than 10 minutes of time have been spent researching, coordinating, and implementing care for this patient today.  Thank you for the details you included in the comment boxes. Those details are very helpful in determining the best course of treatment for you and help [place] to provide the best care.

## 2019-04-16 ENCOUNTER — Ambulatory Visit (INDEPENDENT_AMBULATORY_CARE_PROVIDER_SITE_OTHER): Payer: BC Managed Care – PPO | Admitting: Family Medicine

## 2019-04-16 ENCOUNTER — Encounter: Payer: Self-pay | Admitting: Family Medicine

## 2019-04-16 ENCOUNTER — Other Ambulatory Visit: Payer: Self-pay

## 2019-04-16 VITALS — BP 111/66 | HR 97 | Wt 295.0 lb

## 2019-04-16 DIAGNOSIS — D1801 Hemangioma of skin and subcutaneous tissue: Secondary | ICD-10-CM

## 2019-04-16 DIAGNOSIS — N6320 Unspecified lump in the left breast, unspecified quadrant: Secondary | ICD-10-CM

## 2019-04-16 NOTE — Progress Notes (Signed)
Ann Brock - 34 y.o. female MRN [id number]  Date of birth: [birth year]  Subjective Chief Complaint  Patient presents with  . Breast Mass  . Nevus    HPI Ann Brock is a 34 y.o. female here today with complaint of breast mass.  She reports that she noticed last night while doing self exam.  She also reports that there is what appears to be a mole adjacent to this area that her husband has told her has grown in size.  It has also become more raised.  She denies pain associated with this, breast tenderness, lymph node enlargement or nipple discharge.  She reports family history of breast cancer with mother being diagnosed in her 61's, grandmother diagnosed in her early 51's and aunts diagnosed in their 16's-40's.  She has never had genetic testing for BRCA gene.   ROS:  A comprehensive ROS was completed and negative except as noted per HPI  Allergies  Allergen Reactions  . Aspirin Anaphylaxis  . Cephalosporins Anaphylaxis  . Contrast Media [Iodinated Diagnostic Agents] Hives    Past Medical History:  Diagnosis Date  . Anxiety   . Chest pain    2014  . Depression   . Ectopic pregnancy    treated with methotrexate  . GERD (gastroesophageal reflux disease)   . Hyperemesis gravidarum   . Hypertension    prior to preg, no meds  . Infection    UTI  . Vaginal Pap smear, abnormal    repeat was normal    Past Surgical History:  Procedure Laterality Date  . CESAREAN SECTION    . CESAREAN SECTION N/A 08/05/2015   Procedure: REPEAT CESAREAN SECTION ;  Surgeon: Donnamae Jude, MD;  Location: [facility];  Service: Obstetrics;  Laterality: N/A;  . CHOLECYSTECTOMY    . KNEE SURGERY     3 knee surgerys  . TONSILLECTOMY AND ADENOIDECTOMY      Social History   Socioeconomic History  . Marital status: Married    Spouse name: Not on file  . Number of children: Not on file  . Years of education: Not on file  . Highest education level: Not on file  Occupational  History  . Occupation: sheets  Tobacco Use  . Smoking status: Former Research scientist (life sciences)  . Smokeless tobacco: Never Used  . Tobacco comment: quit  2006  Substance and Sexual Activity  . Alcohol use: No    Alcohol/week: 0.0 standard drinks    Comment: socially prior to pregnancy  . Drug use: No  . Sexual activity: Yes    Partners: Male    Birth control/protection: I.U.D.  Other Topics Concern  . Not on file  Social History Narrative  . Not on file   Social Determinants of Health   Financial Resource Strain:   . Difficulty of Paying Living Expenses: Not on file  Food Insecurity:   . Worried About Charity fundraiser in the Last Year: Not on file  . Ran Out of Food in the Last Year: Not on file  Transportation Needs:   . Lack of Transportation (Medical): Not on file  . Lack of Transportation (Non-Medical): Not on file  Physical Activity:   . Days of Exercise per Week: Not on file  . Minutes of Exercise per Session: Not on file  Stress:   . Feeling of Stress : Not on file  Social Connections:   . Frequency of Communication with Friends and Family: Not on file  . Frequency of Social  Gatherings with Friends and Family: Not on file  . Attends Religious Services: Not on file  . Active Member of Clubs or Organizations: Not on file  . Attends Archivist Meetings: Not on file  . Marital Status: Not on file    Family History  Problem Relation Age of Onset  . Hypertension Mother   . Depression Mother   . Drug abuse Father   . Heart disease Father   . Hyperlipidemia Father   . Hypertension Father   . Stroke Father   . Colon cancer Father 18  . Crohn's disease Father   . Cancer Maternal Grandmother   . Diabetes Maternal Grandmother   . Stroke Maternal Grandmother   . Cancer Maternal Grandfather   . Stroke Maternal Grandfather   . Alcohol abuse Paternal Grandmother   . Cancer Paternal Grandmother   . Stroke Paternal Grandmother   . Alcohol abuse Paternal Grandfather   .  Cancer Paternal Grandfather   . Stroke Paternal Grandfather   . Cerebral palsy Sister   . Kidney disease Sister   . Colon polyps Sister   . Birth defects Brother        vsd  . Rectal cancer Neg Hx   . Esophageal cancer Neg Hx   . Stomach cancer Neg Hx     Health Maintenance  Topic Date Due  . PAP SMEAR-Modifier  01/02/2018  . INFLUENZA VACCINE  06/13/2019 (Originally 10/14/2018)  . TETANUS/TDAP  11/13/2024  . HIV Screening  Completed    ----------------------------------------------------------------------------------------------------------------------------------------------------------------------------------------------------------------- Physical Exam BP 111/66   Pulse 97   Wt 295 lb (133.8 kg)   BMI 47.61 kg/m   Physical Exam Exam conducted with a chaperone present (Jasmine Hartsell).  Constitutional:      Appearance: Normal appearance.  HENT:     Head: Normocephalic and atraumatic.  Eyes:     General: No scleral icterus. Chest:     Breasts:        Right: Normal. No swelling, mass or skin change.        Left: Mass (~3 o'clock LUQ ) present. No nipple discharge, skin change or tenderness.  Lymphadenopathy:     Upper Body:     Right upper body: No supraclavicular, axillary or pectoral adenopathy.     Left upper body: No supraclavicular, axillary or pectoral adenopathy.  Skin:    Comments: 0.25cm slightly raised, crimson colored lesion.    Neurological:     Mental Status: She is alert.     ------------------------------------------------------------------------------------------------------------------------------------------------------------------------------------------------------------------- Assessment and Plan  Left breast mass Diagnostic mammogram ordered.  Skin lesion does not appear to be related to lump.   Cherry angioma Skin lesion on breast consistent with angioma.  Recommend monitoring for now.     This visit occurred during the SARS-CoV-2  public health emergency.  Safety protocols were in place, including screening questions prior to the visit, additional usage of staff PPE, and extensive cleaning of exam room while observing appropriate contact time as indicated for disinfecting solutions.

## 2019-04-16 NOTE — Assessment & Plan Note (Signed)
Skin lesion on breast consistent with angioma.  Recommend monitoring for now.

## 2019-04-16 NOTE — Patient Instructions (Signed)
Nice to meet you today! I have ordered a mammogram to further evaluate the area in your breast.  I don't think the skin growth on the breast is related to the lump you noticed.    Cherry Angioma A cherry angioma is a harmless growth on the skin. It is made up of blood vessels. Cherry angiomas can appear anywhere on the body, but they usually appear on the trunk and arms. What are the causes? The cause of this condition is not known, but it seems to be related to advancing age. What increases the risk? You are more likely to develop this condition if you:  Are over the age of 75.  Have a family member with this condition. What are the signs or symptoms?   Symptoms of this condition include harmless growths that are: ? Smooth, round, and red or purplish-red. ? As small as the tip of a pin or as big as a pencil eraser. How is this diagnosed? This condition is diagnosed with a skin exam. Rarely, a piece of the cherry angioma may be removed for testing if it is not clear that the growth is a cherry angioma. How is this treated? Treatment is not needed for this condition. If you do not like the way a cherry angioma looks, you may have it removed. Removal methods include:  A method where heat is used to burn the cherry angioma off the skin (electrocautery).  A method where the cherry angioma is frozen (cryosurgery). This causes it to eventually fall off the skin.  A method where a laser is used to destroy the red blood cells and blood vessels in the angioma (laser therapy).  A minor surgical procedure. A scalpel is used to remove the cherry angioma off the skin. A cherry angioma may come back after it has been removed. Follow these instructions at home:  If you have a cherry angioma removed, keep the area clean and follow any other care instructions as told by your health care provider.  Take over-the-counter and prescription medicines only as told by your health care provider.  Keep  all follow-up visits as told by your health care provider. This is important. Summary  A cherry angioma is a harmless growth on the skin that is made up of blood vessels.  Treatment is not needed for this condition.  If you do not like the way a cherry angioma looks, you may have it removed.  If you have a cherry angioma removed, follow any care instructions as told by your health care provider. This information is not intended to replace advice given to you by your health care provider. Make sure you discuss any questions you have with your health care provider. Document Revised: 09/20/2017 Document Reviewed: 09/20/2017 Elsevier Patient Education  [place].

## 2019-04-16 NOTE — Assessment & Plan Note (Addendum)
Diagnostic mammogram ordered.  Skin lesion does not appear to be related to lump.

## 2019-04-17 ENCOUNTER — Other Ambulatory Visit: Payer: Self-pay | Admitting: Family Medicine

## 2019-04-17 DIAGNOSIS — N6320 Unspecified lump in the left breast, unspecified quadrant: Secondary | ICD-10-CM

## 2019-04-27 ENCOUNTER — Other Ambulatory Visit: Payer: Self-pay

## 2019-04-27 ENCOUNTER — Ambulatory Visit
Admission: RE | Admit: 2019-04-27 | Discharge: 2019-04-27 | Disposition: A | Discharge status: Home / Self Care | Payer: BC Managed Care – PPO | Source: Ambulatory Visit | Attending: Family Medicine | Admitting: Family Medicine

## 2019-04-27 DIAGNOSIS — N6320 Unspecified lump in the left breast, unspecified quadrant: Secondary | ICD-10-CM

## 2019-04-30 ENCOUNTER — Telehealth: Payer: Self-pay

## 2019-04-30 NOTE — Telephone Encounter (Signed)
Prescription for Dexilant 60 mg not covered by patient's insurance. Covered alternatives are pantoprazole and omeprazole. Pt has tried and failed both (pantoprazole 40 mg BID and omeprazole 40 mg BID).  PA was submitted via CoverMyMeds on 04-26-2019.  PA denied on 04-27-19 for the following: "Documentioan of therapeutic failure, contraindication or intolerance of a 30 day trial of 80 mg per day of both omeprazole and pantoprazole tablet must be submitted for your request to be considered for approval."   Please advise.

## 2019-05-01 NOTE — Telephone Encounter (Signed)
Called [employer] at [phone].  Submitted an Production designer, theatre/television/film for [employer].  Patient HAS tried and failed omep and panto 40mg  BID.(80mg  daily)  Reference [id number]. The person I spoke to indicated it would be marked as urgent and I should hear via fax BY 05-04-19

## 2019-05-01 NOTE — Telephone Encounter (Signed)
Thanks Jan,  Sorry to hear this. Can you clarify did the Dexilant help her at all? If she was pleased on the regimen then I guess we need to file an appear that she has failed both omeprazole and pantoprazole 80mg  / day. Not sure if the patient needs to initiate that or not or contact her insurance company. Thanks

## 2019-05-04 ENCOUNTER — Telehealth: Payer: BC Managed Care – PPO | Admitting: Family

## 2019-05-04 DIAGNOSIS — J302 Other seasonal allergic rhinitis: Secondary | ICD-10-CM | POA: Diagnosis not present

## 2019-05-04 MED ORDER — FLUTICASONE PROPIONATE 50 MCG/ACT NA SUSP: 2.0000 | Freq: Every day | NASAL | 0 refills | Status: DC

## 2019-05-04 MED ORDER — LEVOCETIRIZINE DIHYDROCHLORIDE 5 MG PO TABS: 5.0000 mg | ORAL_TABLET | Freq: Every evening | ORAL | 0 refills | Status: DC

## 2019-05-04 NOTE — Progress Notes (Signed)
E visit for Allergic Rhinitis We are sorry that you are not feeling well.  Here is how we plan to help!  Based on what you have shared with me it looks like you have Allergic Rhinitis.  Rhinitis is when a reaction occurs that causes nasal congestion, runny nose, sneezing, and itching.  Most types of rhinitis are caused by an inflammation and are associated with symptoms in the eyes ears or throat. There are several types of rhinitis.  The most common are acute rhinitis, which is usually caused by a viral illness, allergic or seasonal rhinitis, and nonallergic or year-round rhinitis.  Nasal allergies occur certain times of the year.  Allergic rhinitis is caused when allergens in the air trigger the release of histamine in the body.  Histamine causes itching, swelling, and fluid to build up in the fragile linings of the nasal passages, sinuses and eyelids.  An itchy nose and clear discharge are common.  I recommend the following over the counter treatments: Xyzal 5 mg take 1 tablet daily    I also would recommend a nasal spray: Flonase 2 sprays into each nostril once daily   I sent these meds to the pharmacy.  HOME CARE:   You can use an over-the-counter saline nasal spray as needed  Avoid areas where there is heavy dust, mites, or molds  Stay indoors on windy days during the pollen season  Keep windows closed in home, at least in bedroom; use air conditioner.  Use high-efficiency house air filter  Keep windows closed in car, turn AC on re-circulate  Avoid playing out with dog during pollen season  GET HELP RIGHT AWAY IF:   If your symptoms do not improve within 10 days  You become short of breath  You develop yellow or green discharge from your nose for over 3 days  You have coughing fits  MAKE SURE YOU:   Understand these instructions  Will watch your condition  Will get help right away if you are not doing well or get worse  Thank you for choosing an  e-visit. Your e-visit answers were reviewed by a board certified advanced clinical practitioner to complete your personal care plan. Depending upon the condition, your plan could have included both over the counter or prescription medications. Please review your pharmacy choice. Be sure that the pharmacy you have chosen is open so that you can pick up your prescription now.  If there is a problem you may message your provider in [place] to have the prescription routed to another pharmacy. Your safety is important to [place]. If you have drug allergies check your prescription carefully.  For the next 24 hours, you can use MyChart to ask questions about today's visit, request a non-urgent call back, or ask for a work or school excuse from your e-visit provider. You will get an email in the next two days asking about your experience. I hope that your e-visit has been valuable and will speed your recovery.  Greater than 5 minutes, yet less than 10 minutes of time have been spent researching, coordinating, and implementing care for this patient today.  Thank you for the details you included in the comment boxes. Those details are very helpful in determining the best course of treatment for you and help [place] to provide the best care.

## 2019-05-07 NOTE — Telephone Encounter (Signed)
Called and LM for pt that appeal was approved. Called and spoke to pharmacist.  It would still be over $130. Pharmacist will call patient and let her know.  We tried Good[facility] and it was over $300.

## 2019-05-07 NOTE — Telephone Encounter (Signed)
Dexilant approved from 02-20-19 to 05-01-2020.  [phone]

## 2019-05-09 NOTE — Telephone Encounter (Signed)
Faxed Instant Savings Card for Dexilant to [place] on 2-22.  Called pharmacy today. The savings card brought it to $52.01 for a 1 month supply.  Called and LM for pt with that information and asked her to call me back to see if she wanted to move forward with that price.  Does she have a deductible that she may meet within a few months that would make it doable?

## 2019-05-29 NOTE — Telephone Encounter (Signed)
Called and LM for pt to call back to discuss.

## 2019-05-30 MED ORDER — ESOMEPRAZOLE MAGNESIUM 40 MG PO CPDR: 40.0000 mg | DELAYED_RELEASE_CAPSULE | Freq: Two times a day (BID) | ORAL | 1 refills | Status: DC

## 2019-05-30 NOTE — Telephone Encounter (Signed)
A Dexilant manufacturer coupon was used to reduce cost to $52/month. Called and spoke to pt.  She has not tried Nexium and would be willing to try that.  I will send Nexium 40 mg bid to [facility].  She agreed to let us know how she does in a couple of weeks.

## 2019-05-30 NOTE — Telephone Encounter (Signed)
Thanks Ann Brock.  Unfortunately she has failed omeprazole and Protonix in the past.  I do not think she would do well if you put her back on that.  Not sure if there is any coupon that might be able to help her with the [place], as that helped her more than anything else she has been on.  If she has not tried Nexium we could try 40 mg of Nexium twice daily if that is any cheaper, I am not sure.

## 2019-05-30 NOTE — Telephone Encounter (Signed)
Called and spoke to pt. She said she can not afford $52 a month for Dexilant because she also pays a lot for some of her other medications.  Her deductible is $5,000.  She is currently taking pepcid and lots of TUMs.  Is there anything else you would recommend she try?

## 2019-05-30 NOTE — Telephone Encounter (Signed)
Pt returned your call.  

## 2019-05-31 ENCOUNTER — Telehealth: Payer: Self-pay | Admitting: Gastroenterology

## 2019-06-01 NOTE — Telephone Encounter (Signed)
Initiated prior authorization with Cover my Meds.  Awaiting response.

## 2019-06-06 ENCOUNTER — Telehealth: Payer: Self-pay | Admitting: Gastroenterology

## 2019-06-06 MED ORDER — ESOMEPRAZOLE MAGNESIUM 40 MG PO CPDR: 40.0000 mg | DELAYED_RELEASE_CAPSULE | Freq: Two times a day (BID) | ORAL | 1 refills | Status: DC

## 2019-06-06 MED ORDER — ESOMEPRAZOLE MAGNESIUM 40 MG PO CPDR: 40.0000 mg | DELAYED_RELEASE_CAPSULE | Freq: Every day | ORAL | 1 refills | Status: DC

## 2019-06-06 NOTE — Telephone Encounter (Signed)
Called and LM for pt that I will send script for esomeprazole 40mg  once daily and asked her to try that for a week or two.  If she fails once daily dosing I can do a PA for twice daily.

## 2019-06-06 NOTE — Telephone Encounter (Signed)
Called and spoke to pt.  Nexium 40 mg once daily is >$215 a month through her insurance.  [place] and they recommended she buy Nexium OTC.  Called patient.  We looked at [facility] and found it cheapest at [facility] on [facility].  Sent script for Nexium 40 mg bid. HT price for #60 of the 40mg  is $14.00.

## 2019-06-06 NOTE — Telephone Encounter (Signed)
She has failed a number of other options - omeprazole, pantoprazole at BID dosing. I don't think single daily dosing will suffice, unless her insurance won't cover it in any other way. If once daily doesn't work she can call back after one week and see if we can increase to BID. Thanks

## 2019-06-06 NOTE — Telephone Encounter (Signed)
Prior Auth for esomeprazole (Nexium 40mg ) BID denied due to pt has not tried and failed once daily dosing.  OK to advise pt to try once daily dosing?

## 2019-07-06 ENCOUNTER — Telehealth: Payer: BC Managed Care – PPO | Admitting: Nurse Practitioner

## 2019-07-06 DIAGNOSIS — L509 Urticaria, unspecified: Secondary | ICD-10-CM

## 2019-07-06 MED ORDER — PREDNISONE 10 MG (21) PO TBPK: ORAL_TABLET | ORAL | 0 refills | Status: DC

## 2019-07-06 NOTE — Progress Notes (Signed)
E Visit for Rash  We are sorry that you are not feeling well. Here is how we plan to help!    Based on what you shared with me you may have a virus or an allergic reaction.  Avoid contact with pregnant women until a diagnosis is made.  Most viral rashes are contagious (especially if a fever is present).  You can return to work or school after the rash is gone or when your doctor says it is safe to return with the rash.     Prednisone 10 mg daily for 6 days (see taper instructions below)  Directions for 6 day taper: Day 1: 2 tablets before breakfast, 1 after both lunch & dinner and 2 at bedtime Day 2: 1 tab before breakfast, 1 after both lunch & dinner and 2 at bedtime Day 3: 1 tab at each meal & 1 at bedtime Day 4: 1 tab at breakfast, 1 at lunch, 1 at bedtime Day 5: 1 tab at breakfast & 1 tab at bedtime Day 6: 1 tab at breakfast     HOME CARE:   Take cool showers and avoid direct sunlight.  Apply cool compress or wet dressings.  Take a bath in an oatmeal bath.  Sprinkle content of one Aveeno packet under running faucet with comfortably warm water.  Bathe for 15-20 minutes, 1-2 times daily.  Pat dry with a towel. Do not rub the rash.  Use hydrocortisone cream.  Take an antihistamine like Benadryl for widespread rashes that itch.  The adult dose of Benadryl is 25-50 mg by mouth 4 times daily.  Caution:  This type of medication may cause sleepiness.  Do not drink alcohol, drive, or operate dangerous machinery while taking antihistamines.  Do not take these medications if you have prostate enlargement.  Read package instructions thoroughly on all medications that you take.  GET HELP RIGHT AWAY IF:   Symptoms don't go away after treatment.  Severe itching that persists.  If you rash spreads or swells.  If you rash begins to smell.  If it blisters and opens or develops a yellow-brown crust.  You develop a fever.  You have a sore throat.  You become short of  breath.  MAKE SURE YOU:  Understand these instructions. Will watch your condition. Will get help right away if you are not doing well or get worse.  Thank you for choosing an e-visit. Your e-visit answers were reviewed by a board certified advanced clinical practitioner to complete your personal care plan. Depending upon the condition, your plan could have included both over the counter or prescription medications. Please review your pharmacy choice. Be sure that the pharmacy you have chosen is open so that you can pick up your prescription now.  If there is a problem you may message your provider in [place] to have the prescription routed to another pharmacy. Your safety is important to [place]. If you have drug allergies check your prescription carefully.  For the next 24 hours, you can use MyChart to ask questions about today's visit, request a non-urgent call back, or ask for a work or school excuse from your e-visit provider. You will get an email in the next two days asking about your experience. I hope that your e-visit has been valuable and will speed your recovery.    5-10 minutes spent reviewing and documenting in chart.

## 2019-08-27 ENCOUNTER — Encounter: Payer: Self-pay | Admitting: Family Medicine

## 2019-09-07 ENCOUNTER — Ambulatory Visit: Payer: BC Managed Care – PPO | Admitting: Nurse Practitioner

## 2019-09-11 ENCOUNTER — Other Ambulatory Visit: Payer: Self-pay

## 2019-09-11 ENCOUNTER — Encounter: Payer: Self-pay | Admitting: Family Medicine

## 2019-09-11 ENCOUNTER — Ambulatory Visit (INDEPENDENT_AMBULATORY_CARE_PROVIDER_SITE_OTHER): Payer: BC Managed Care – PPO

## 2019-09-11 ENCOUNTER — Ambulatory Visit (INDEPENDENT_AMBULATORY_CARE_PROVIDER_SITE_OTHER): Payer: BC Managed Care – PPO | Admitting: Family Medicine

## 2019-09-11 VITALS — BP 133/65 | HR 88 | Ht 66.14 in | Wt 299.9 lb

## 2019-09-11 DIAGNOSIS — I83811 Varicose veins of right lower extremities with pain: Secondary | ICD-10-CM | POA: Diagnosis not present

## 2019-09-11 DIAGNOSIS — M25561 Pain in right knee: Secondary | ICD-10-CM

## 2019-09-11 MED ORDER — DICLOFENAC SODIUM 75 MG PO TBEC: 75.0000 mg | DELAYED_RELEASE_TABLET | Freq: Two times a day (BID) | ORAL | 0 refills | Status: DC

## 2019-09-11 NOTE — Patient Instructions (Signed)
Try oral diclofenac for knee pain.  Have xray completed today.  I have placed a referral to vascular to evaluate varicose veins.

## 2019-09-11 NOTE — Progress Notes (Signed)
Ann Brock - 34 y.o. female MRN [id number]  Date of birth: 1985-07-11  Subjective Chief Complaint  Patient presents with  . Knee Pain    HPI Ann Brock is a 34 y.o. female here today with complaint of knee pain.  Knee pain is chronic with acute worsening over the past couple of weeks.  Her job requires her to be on her feet for prolonged periods of time which often exacerbates her pain.  She also reports significant varicose veins that contribute to pain in her leg.  She has had injection to knee previously with Dr. Georgina Snell which helped for a short period of time.  She has tried topical anti-inflammatories in the past without improvement.  She denies warmth of the knee, increased swelling.   ROS:  A comprehensive ROS was completed and negative except as noted per HPI  Allergies  Allergen Reactions  . Aspirin Anaphylaxis  . Cephalosporins Anaphylaxis  . Contrast Media [Iodinated Diagnostic Agents] Hives    Past Medical History:  Diagnosis Date  . Anxiety   . Chest pain    2014  . Depression   . Ectopic pregnancy    treated with methotrexate  . GERD (gastroesophageal reflux disease)   . Hyperemesis gravidarum   . Hypertension    prior to preg, no meds  . Infection    UTI  . Vaginal Pap smear, abnormal    repeat was normal    Past Surgical History:  Procedure Laterality Date  . CESAREAN SECTION    . CESAREAN SECTION N/A 08/05/2015   Procedure: REPEAT CESAREAN SECTION ;  Surgeon: Donnamae Jude, MD;  Location: [facility];  Service: Obstetrics;  Laterality: N/A;  . CHOLECYSTECTOMY    . KNEE SURGERY     3 knee surgerys  . TONSILLECTOMY AND ADENOIDECTOMY      Social History   Socioeconomic History  . Marital status: Married    Spouse name: Not on file  . Number of children: Not on file  . Years of education: Not on file  . Highest education level: Not on file  Occupational History  . Occupation: sheets  Tobacco Use  . Smoking status: Former  Research scientist (life sciences)  . Smokeless tobacco: Never Used  . Tobacco comment: quit  2006  Vaping Use  . Vaping Use: Never used  Substance and Sexual Activity  . Alcohol use: No    Alcohol/week: 0.0 standard drinks    Comment: socially prior to pregnancy  . Drug use: No  . Sexual activity: Yes    Partners: Male    Birth control/protection: I.U.D.  Other Topics Concern  . Not on file  Social History Narrative  . Not on file   Social Determinants of Health   Financial Resource Strain:   . Difficulty of Paying Living Expenses:   Food Insecurity:   . Worried About Charity fundraiser in the Last Year:   . Arboriculturist in the Last Year:   Transportation Needs:   . Film/video editor (Medical):   Marland Kitchen Lack of Transportation (Non-Medical):   Physical Activity:   . Days of Exercise per Week:   . Minutes of Exercise per Session:   Stress:   . Feeling of Stress :   Social Connections:   . Frequency of Communication with Friends and Family:   . Frequency of Social Gatherings with Friends and Family:   . Attends Religious Services:   . Active Member of Clubs or Organizations:   .  Attends Archivist Meetings:   Marland Kitchen Marital Status:     Family History  Problem Relation Age of Onset  . Hypertension Mother   . Depression Mother   . Breast cancer Mother 85  . Drug abuse Father   . Heart disease Father   . Hyperlipidemia Father   . Hypertension Father   . Stroke Father   . Colon cancer Father 34  . Crohn's disease Father   . Cancer Maternal Grandmother   . Diabetes Maternal Grandmother   . Stroke Maternal Grandmother   . Alcohol abuse Maternal Grandmother   . Cancer Maternal Grandfather   . Stroke Maternal Grandfather   . Alcohol abuse Paternal Grandmother   . Cancer Paternal Grandmother   . Stroke Paternal Grandmother   . Alcohol abuse Paternal Grandfather   . Cancer Paternal Grandfather   . Stroke Paternal Grandfather   . Cerebral palsy Sister   . Kidney disease Sister   .  Colon polyps Sister   . Birth defects Brother        vsd  . Rectal cancer Neg Hx   . Esophageal cancer Neg Hx   . Stomach cancer Neg Hx     Health Maintenance  Topic Date Due  . Hepatitis C Screening  Never done  . COVID-19 Vaccine (1) Never done  . PAP SMEAR-Modifier  01/02/2018  . INFLUENZA VACCINE  10/14/2019  . TETANUS/TDAP  11/13/2024  . HIV Screening  Completed     ----------------------------------------------------------------------------------------------------------------------------------------------------------------------------------------------------------------- Physical Exam BP 133/65 (BP Location: Left Arm, Patient Position: Sitting, Cuff Size: Large)   Pulse 88   Ht 5' 6.14" (1.68 m)   Wt 299 lb 14.4 oz (136 kg)   SpO2 100%   BMI 48.20 kg/m   Physical Exam Constitutional:      Appearance: She is obese.  Eyes:     General: No scleral icterus. Musculoskeletal:     Comments: R knee without effusion. She has tenderness along joint line.  ROM is normal. There are extensive varicosities that cross and surround the knee and extend into the thigh.    Neurological:     Mental Status: She is alert.     ------------------------------------------------------------------------------------------------------------------------------------------------------------------------------------------------------------------- Assessment and Plan  Right knee pain Xrays of knee ordered today.  Start diclofenac 75mg  bid as needed.  She did have some success with injection previously but would defer this to Dr. Darene Lamer  given varicosities surrounding knee.  Referral entered to vascular surgery for further evaluation of varicosities.    Meds ordered this encounter  Medications  . diclofenac (VOLTAREN) 75 MG EC tablet    Sig: Take 1 tablet (75 mg total) by mouth 2 (two) times daily. Take with food    Dispense:  60 tablet    Refill:  0    No follow-ups on file.    This visit  occurred during the SARS-CoV-2 public health emergency.  Safety protocols were in place, including screening questions prior to the visit, additional usage of staff PPE, and extensive cleaning of exam room while observing appropriate contact time as indicated for disinfecting solutions.

## 2019-09-11 NOTE — Assessment & Plan Note (Signed)
Xrays of knee ordered today.  Start diclofenac 75mg  bid as needed.  She did have some success with injection previously but would defer this to Dr. Darene Lamer  given varicosities surrounding knee.  Referral entered to vascular surgery for further evaluation of varicosities.

## 2019-09-18 ENCOUNTER — Encounter: Payer: Self-pay | Admitting: Family Medicine

## 2019-09-27 NOTE — Telephone Encounter (Signed)
See mychart message, Please call and schedule appointment for patient. Thanks

## 2019-10-01 ENCOUNTER — Ambulatory Visit: Payer: BC Managed Care – PPO | Admitting: Sports Medicine

## 2019-10-04 ENCOUNTER — Other Ambulatory Visit: Payer: Self-pay

## 2019-10-04 DIAGNOSIS — I8390 Asymptomatic varicose veins of unspecified lower extremity: Secondary | ICD-10-CM

## 2019-10-16 ENCOUNTER — Other Ambulatory Visit: Payer: Self-pay

## 2019-10-16 ENCOUNTER — Ambulatory Visit (HOSPITAL_COMMUNITY)
Admission: RE | Admit: 2019-10-16 | Discharge: 2019-10-16 | Disposition: A | Discharge status: Home / Self Care | Payer: BC Managed Care – PPO | Source: Ambulatory Visit | Attending: Vascular Surgery | Admitting: Vascular Surgery

## 2019-10-16 ENCOUNTER — Ambulatory Visit (INDEPENDENT_AMBULATORY_CARE_PROVIDER_SITE_OTHER): Payer: BC Managed Care – PPO | Admitting: Physician Assistant

## 2019-10-16 VITALS — BP 122/82 | HR 81 | Temp 97.6°F | Resp 20 | Ht 66.0 in | Wt 297.0 lb

## 2019-10-16 DIAGNOSIS — I8390 Asymptomatic varicose veins of unspecified lower extremity: Secondary | ICD-10-CM | POA: Diagnosis present

## 2019-10-16 DIAGNOSIS — I8393 Asymptomatic varicose veins of bilateral lower extremities: Secondary | ICD-10-CM

## 2019-10-16 NOTE — Progress Notes (Signed)
VASCULAR & VEIN SPECIALISTS           OF [place]  History and Physical   Ann Brock is a 34 y.o. female who presents with right knee pain and hx of leg swelling with varicose veins.  She states that she has pain around her right knee.  She has a torn meniscus in the right knee and is followed by Dr. Ronnald Ramp, orthopedic surgeon at [facility] in [place] She states she has had steroid injections that gave her temporary relief.  She states that he pain is anterior just below the knee cap and medially.  She has hx of knee pain since an early age.    She states that she has left thigh bruising over varicose veins on the anterior thigh that has been present for about 2 weeks.  She states she was told she had a blood clot in the superficial vein and points laterally to the left thigh.  Otherwise, she states that after left knee surgery, she did have a DVT in the left leg and required heparin shots for this per pt.  She works as a Dance movement psychotherapist at [employer].  She states she has not been able to work due to her knee pain.  She has worn thigh high compression in the past but she states she did not get any relief.  She has elevated her legs.  She states that the swelling is better in the morning when she wakes, but not completely gone.   She has had knee surgery x 2 on each leg.  She is not on any supplemental estrogen except IUD.  She quit smoking years ago.   The pt is not on a statin for cholesterol management.  The pt is not on a daily aspirin.   Other AC:  none The pt is not on meds for hypertension.   The pt is not diabetic.   Tobacco hx:  former   Past Medical History:  Diagnosis Date  . Anxiety   . Chest pain    2014  . Depression   . Ectopic pregnancy    treated with methotrexate  . GERD (gastroesophageal reflux disease)   . Hyperemesis gravidarum   . Hypertension    prior to preg, no meds  . Infection    UTI  . Vaginal Pap smear, abnormal    repeat was normal      Past Surgical History:  Procedure Laterality Date  . CESAREAN SECTION    . CESAREAN SECTION N/A 08/05/2015   Procedure: REPEAT CESAREAN SECTION ;  Surgeon: Donnamae Jude, MD;  Location: [facility];  Service: Obstetrics;  Laterality: N/A;  . CHOLECYSTECTOMY    . KNEE SURGERY     3 knee surgerys  . TONSILLECTOMY AND ADENOIDECTOMY      Social History   Socioeconomic History  . Marital status: Married    Spouse name: Not on file  . Number of children: 3  . Years of education: Not on file  . Highest education level: Not on file  Occupational History  . Occupation: sheets  Tobacco Use  . Smoking status: Former Research scientist (life sciences)  . Smokeless tobacco: Never Used  . Tobacco comment: quit  2006  Vaping Use  . Vaping Use: Never used  Substance and Sexual Activity  . Alcohol use: No    Alcohol/week: 0.0 standard drinks    Comment: socially prior to pregnancy  . Drug use: No  .  Sexual activity: Yes    Partners: Male    Birth control/protection: I.U.D.  Other Topics Concern  . Not on file  Social History Narrative  . Not on file   Social Determinants of Health   Financial Resource Strain:   . Difficulty of Paying Living Expenses:   Food Insecurity:   . Worried About Charity fundraiser in the Last Year:   . Arboriculturist in the Last Year:   Transportation Needs:   . Film/video editor (Medical):   Marland Kitchen Lack of Transportation (Non-Medical):   Physical Activity:   . Days of Exercise per Week:   . Minutes of Exercise per Session:   Stress:   . Feeling of Stress :   Social Connections:   . Frequency of Communication with Friends and Family:   . Frequency of Social Gatherings with Friends and Family:   . Attends Religious Services:   . Active Member of Clubs or Organizations:   . Attends Archivist Meetings:   Marland Kitchen Marital Status:   Intimate Partner Violence:   . Fear of Current or Ex-Partner:   . Emotionally Abused:   Marland Kitchen Physically Abused:   . Sexually  Abused:     Family History  Problem Relation Age of Onset  . Hypertension Mother   . Depression Mother   . Breast cancer Mother 59  . Drug abuse Father   . Heart disease Father   . Hyperlipidemia Father   . Hypertension Father   . Stroke Father   . Colon cancer Father 61  . Crohn's disease Father   . Cancer Maternal Grandmother   . Diabetes Maternal Grandmother   . Stroke Maternal Grandmother   . Alcohol abuse Maternal Grandmother   . Cancer Maternal Grandfather   . Stroke Maternal Grandfather   . Alcohol abuse Paternal Grandmother   . Cancer Paternal Grandmother   . Stroke Paternal Grandmother   . Alcohol abuse Paternal Grandfather   . Cancer Paternal Grandfather   . Stroke Paternal Grandfather   . Cerebral palsy Sister   . Kidney disease Sister   . Colon polyps Sister   . Birth defects Brother        vsd  . Rectal cancer Neg Hx   . Esophageal cancer Neg Hx   . Stomach cancer Neg Hx     Current Outpatient Medications  Medication Sig Dispense Refill  . acetaminophen (TYLENOL) 325 MG tablet Take 650 mg by mouth every 6 (six) hours as needed for headache.     . diclofenac (VOLTAREN) 75 MG EC tablet Take 1 tablet (75 mg total) by mouth 2 (two) times daily. Take with food 60 tablet 0  . EPINEPHrine 0.3 mg/0.3 mL IJ SOAJ injection 0.3 mg. As directed    . esomeprazole (NEXIUM) 40 MG capsule Take 1 capsule (40 mg total) by mouth 2 (two) times daily before a meal. 60 capsule 1  . FLUoxetine (PROZAC) 40 MG capsule TAKE 1 CAPSULE BY MOUTH IN THE MORNING (BEGIN AFTER 3 WEEK STARTER SCRIPT)    . lamoTRIgine (LAMICTAL) 100 MG tablet 150 mg daily.     Marland Kitchen levonorgestrel (MIRENA) 20 MCG/24HR IUD by Intrauterine route.     No current facility-administered medications for this visit.    Allergies  Allergen Reactions  . Aspirin Anaphylaxis  . Cephalosporins Anaphylaxis  . Contrast Media [Iodinated Diagnostic Agents] Hives    REVIEW OF SYSTEMS:   [X]  denotes positive finding, [  ] denotes negative  finding Cardiac  Comments:  Chest pain or chest pressure:    Shortness of breath upon exertion:    Short of breath when lying flat:    Irregular heart rhythm:        Vascular    Pain in calf, thigh, or hip brought on by ambulation:    Pain in feet at night that wakes you up from your sleep:     Blood clot in your veins: x See HPI  Leg swelling:  x       Pulmonary    Oxygen at home:    Productive cough:     Wheezing:         Neurologic    Sudden weakness in arms or legs:     Sudden numbness in arms or legs:     Sudden onset of difficulty speaking or slurred speech:    Temporary loss of vision in one eye:     Problems with dizziness:         Gastrointestinal    Blood in stool:     Vomited blood:         Genitourinary    Burning when urinating:     Blood in urine:        Psychiatric    Major depression:         Hematologic    Bleeding problems:    Problems with blood clotting too easily:        Skin    Rashes or ulcers:        Constitutional    Fever or chills:      PHYSICAL EXAMINATION:  Today's Vitals   10/16/19 1408 10/16/19 1416  BP:  122/82  Pulse:  81  Resp:  20  Temp:  97.6 F (36.4 C)  TempSrc:  Temporal  SpO2:  100%  Weight:  297 lb (134.7 kg)  Height:  5\' 6"  (1.676 m)  PainSc: 7     Body mass index is 47.94 kg/m.  General:  WDWN in NAD; vital signs documented above Gait: Not observed HENT: WNL, normocephalic Pulmonary: normal non-labored breathing without wheezing Cardiac: regular HR; without carotid bruits Abdomen: soft, NT, no masses Skin: without rashes Vascular Exam/Pulses:  Right Left  Radial 2+ (normal) 2+ (normal)  DP 2+ (normal) 2+ (normal)  PT Unable to palpate Unable to palpate   Extremities: without ischemic changes, without cellulitis; without open wounds  Right leg   Left leg      Inner right thigh    Musculoskeletal: no muscle wasting or atrophy  Neurologic: A&O X 3;  moving all  extremities equally Psychiatric:  The pt has Normal affect.   Non-Invasive Vascular Imaging:   Venous duplex on 10/16/2019: Venous Reflux Times  +-----------------------+---------+------+----------+-----------+----------  ----+  RIGHT         Reflux NoReflux Reflux  Diameter Comments                        Yes   Time    cms            +-----------------------+---------+------+----------+-----------+----------  ----+  CFV                yes >1 second                 +-----------------------+---------+------+----------+-----------+----------  ----+  FV mid               yes >1 second                 +-----------------------+---------+------+----------+-----------+----------  ----+  Popliteal       no                            +-----------------------+---------+------+----------+-----------+----------  ----+  GSV at [facility]             yes  >500 ms   0.67            +-----------------------+---------+------+----------+-----------+----------  ----+  GSV prox thigh           yes  >500 ms   0.51            +-----------------------+---------+------+----------+-----------+----------  ----+  GSV mid thigh     no              0.36            +-----------------------+---------+------+----------+-----------+----------  ----+  GSV dist thigh           yes  >500 ms   0.31            +-----------------------+---------+------+----------+-----------+----------  ----+  GSV prox calf     no              0.24            +-----------------------+---------+------+----------+-----------+----------  ----+  SSV Pop Fossa     no              0.27              +-----------------------+---------+------+----------+-----------+----------  ----+  SSV prox calf     no              0.33            +-----------------------+---------+------+----------+-----------+----------  ----+  SSV mid calf      no              0.13            +-----------------------+---------+------+----------+-----------+----------  ----+  AASV                             acute  thrombus  +-----------------------+---------+------+----------+-----------+----------  ----+  Knee- varicosity of                     acute  thrombus  AASV                                       +-----------------------+---------+------+----------+-----------+----------    ----+  LEFT     Reflux NoRefluxReflux TimeDiameter cmsComments                      Yes                          +--------------+---------+------+-----------+------------+-----------------  ----+  CFV      no                                +--------------+---------+------+-----------+------------+-----------------  ----+  FV mid    no                                +--------------+---------+------+-----------+------------+-----------------  ----+  Popliteal         yes  >1 second                    +--------------+---------+------+-----------+------------+-----------------  ----+  GSV at [facility]        yes  >500 ms   1.34               +--------------+---------+------+-----------+------------+-----------------  ----+  GSV prox thigh      yes  >500 ms   0.78                +--------------+---------+------+-----------+------------+-----------------  ----+  GSV mid thigh no               0.45               +--------------+---------+------+-----------+------------+-----------------  ----+  GSV dist thigh      yes  >500 ms   0.38               +--------------+---------+------+-----------+------------+-----------------  ----+  GSV prox calf no               0.3                +--------------+---------+------+-----------+------------+-----------------  ----+  SSV Pop Fossa       yes  >500 ms   0.38  SSV confluence  with                              popliteal and  courses                             proximally        +--------------+---------+------+-----------+------------+-----------------  ----+  SSV prox calf no               0.27               +--------------+---------+------+-----------+------------+-----------------  ----+  SSV mid calf no               0.18               +--------------+---------+------+-----------+------------+-----------------  ----+  AASV                         acute thrombus      +--------------+---------+------+-----------+------------+-----------------   Summary:  Right:  - No evidence of deep vein thrombosis seen in the right lower extremity,  from the common femoral through the popliteal veins.  - Venous reflux is noted in the right common femoral vein.  - Venous reflux is noted in the right sapheno-femoral junction.  - Venous reflux is noted in the right greater saphenous vein in the thigh.  - Venous reflux is noted in the right femoral vein.  - Acute thrombosis of the AASV.    Left:  - No evidence of deep vein thrombosis seen in  the left lower extremity,  from the common femoral through the popliteal veins.  - Venous reflux is noted in the left sapheno-femoral junction.  - Venous reflux is noted in the left greater saphenous vein in the thigh.  - Venous reflux is noted in the left popliteal vein.  - Venous reflux is noted in the left small saphenous vein.  - Acute thrombosis of the AASV.    Blessing Zaucha is a 34 y.o. female who presents with: chronic leg swelling with right knee pain.    -Pt with recent torn right knee meniscus with pain.  She has had steroid injection with only temporary relief.  -she does have reflux bilaterally in the GSV at the [facility]  as well as the GSV in the thigh bilaterally.   She also has acute thrombus in the AASV bilaterally.  I discussed this with Dr. Donnetta Hutching and she does not need anticoagulation and it is treated like a superficial thrombophlebitis with NSAIDs and time.  Do not feel that her right knee pain is due to her varicosities.  -she is measured for 20-36mmHg thigh high compression stockings today.  I discussed putting them on in the am and removing them before bed.     She may be laser ablation candidate given her reflux and size of the vein proximally that could be feeding her AASV.  She will return in 3 months to see either Dr. Scot Dock or Dr. Oneida Alar to be evaluated.   -discussed importance of weight loss and proper way to elevate legs and she is given a brochure.  -also discussed how to hold pressure should one of her varicosities ever bleeds.   Leontine Locket, [facility] 10/16/2019 3:57 PM  Clinic MD:  Early

## 2019-10-19 DIAGNOSIS — M2241 Chondromalacia patellae, right knee: Secondary | ICD-10-CM | POA: Insufficient documentation

## 2019-11-06 ENCOUNTER — Other Ambulatory Visit: Payer: Self-pay | Admitting: Gastroenterology

## 2020-01-24 ENCOUNTER — Ambulatory Visit: Payer: BC Managed Care – PPO | Admitting: Vascular Surgery

## 2020-02-18 ENCOUNTER — Encounter: Payer: Self-pay | Admitting: Nurse Practitioner

## 2020-02-18 ENCOUNTER — Telehealth (INDEPENDENT_AMBULATORY_CARE_PROVIDER_SITE_OTHER): Payer: Self-pay | Admitting: Nurse Practitioner

## 2020-02-18 DIAGNOSIS — R059 Cough, unspecified: Secondary | ICD-10-CM

## 2020-02-18 DIAGNOSIS — Z20822 Contact with and (suspected) exposure to covid-19: Secondary | ICD-10-CM

## 2020-02-18 MED ORDER — BENZONATATE 100 MG PO CAPS: 100.0000 mg | ORAL_CAPSULE | Freq: Three times a day (TID) | ORAL | 0 refills | Status: DC | PRN

## 2020-02-18 NOTE — Progress Notes (Signed)
Virtual Video Visit via MyChart Note  I connected with  [name] on 02/18/20 at  2:10 PM EST by the video enabled telemedicine application for , MyChart, and verified that I am speaking with the correct person using two identifiers.   I introduced myself as a Designer, jewellery with the practice. We discussed the limitations of evaluation and management by telemedicine and the availability of in person appointments. The patient expressed understanding and agreed to proceed.  Participating parties in this visit include: The patient and nurse practitioner listed.  The patient is: at home I am: In the office  Subjective:    CC: Exposed to COVID  HPI: Ann Brock is a 34 y.o. y/o female presenting via [place] today for upper respiratory symptoms that have been ongoing for about 2 days. She reports that her son was COVID positive 3 days ago and she has been exposed.  She reports that he has also tested positive for multiple viral strains.   She endorses headache, cough, "lungs burning" when supine, chills, diarrhea, and congestion.   She denies fever, sinus pain/pressure, loss of taste, loss of smell, nausea, vomiting, fatigue, dizziness, shortness of breath, chest pain, wheezing, body aches, or rhinorrhea.   Past medical history, Surgical history, Family history not pertinant except as noted below, Social history, Allergies, and medications have been entered into the medical record, reviewed, and corrections made.   Review of Systems:  All review of systems negative except what is listed in the HPI  Objective:    General: Speaking clearly in complete sentences without any shortness of breath.   Alert and oriented x3.   Normal judgment.  No apparent acute distress. She is audibly congested with a frequent dry cough.   Impression and Recommendations:    1. Contact with and (suspected) exposure to covid-19 2. Cough Cough and congestion with exposure to COVID-19 by  household member with symptoms for the past 2 days.  Will plan for testing tomorrow at 10am in this office due to need of confirmatory testing for work/school.  Viral upper respiratory recommendations discussed with patient and provided on AVS sheet.  Quarantine precautions discussed with patient and provided on AVS sheet.  Recommend follow-up if symptoms worsen or fail to improve.  Emergency respiratory symptoms discussed.  - benzonatate (TESSALON) 100 MG capsule; Take 1 capsule (100 mg total) by mouth 3 (three) times daily as needed for cough.  Dispense: 30 capsule; Refill: 0     I discussed the assessment and treatment plan with the patient. The patient was provided an opportunity to ask questions and all were answered. The patient agreed with the plan and demonstrated an understanding of the instructions.   The patient was advised to call back or seek an in-person evaluation if the symptoms worsen or if the condition fails to improve as anticipated.  I provided 20 minutes of non-face-to-face interaction with this [facility] visit including intake, same-day documentation, and chart review.   Orma Render, NP

## 2020-02-18 NOTE — Patient Instructions (Addendum)
Your symptoms are consistent with COVID-19 infection. Given the recent exposure and supportive symptoms, it is highly likely that you are positive at this time.   We will place you on the nurse schedule for testing tomorrow morning at 10 am. Please pull up to the front of the office under the roof and call the office to let them know you are here [phone]) and one of the nurses will come out to swab you. Please be sure you stay in your car.  The tests can take up to 4 days to result, but we will let you know once we have those results back.    Given that your symptoms are consistent with a viral infection, we recommend supportive care measures including:   The following information is provided as a Human resources officer for ADULT patients only and does NOT take into account PREGNANCY, ALLERGIES, LIVER CONDITIONS, KIDNEY CONDITIONS, GASTROINTESTINAL CONDITIONS, OR PRESCRIPTION MEDICATION INTERACTIONS. Please be sure to ask your provider if the following are safe to take with your specific medical history, conditions, or current medication regimen if you are unsure.   Adult OTC Symptom Management for COVID-19  Be sure to drink plenty of fluids and allow your body to rest as much as possible.   Congestion: Guaifenesin (Mucinex)- follow directions on packaging with a maximum dose of 2400mg  in a 24 hour period. Mucinex D may be more helpful if you are experiencing a lot of congestion.   Pain/Fever: Acetaminophen 500mg  -1000mg  every 6-8 hours as needed (MAX 3000mg  in a 24 hour period) Ibuprofen may be used if Acetaminophen is not helpful-  Ibuprofen 200mg  - 400mg  every 4-6 hours as needed (MAX 1200mg  in a 24 hour period)  Cough: Dextromethorphan (Delsym)- follow directions on packing with a maximum dose of 120mg  in a 24 hour period.  Nasal Stuffiness: Saline nasal spray and/or Nettie Pot with sterile saline solution. Oxymetazoline (Afrin) may be used for NO MORE than 3 days  Runny Nose:  Fluticasone nasal spray (Flonase) OR Mometasone nasal spray (Nasonex) OR Triamcinolone Acetonide nasal spray (Nasacort)- follow directions on the packaging  Sinus Pain/Pressure: Warm washcloth to the face  Sore Throat: Warm salt water gargles, warm tea, honey, Cepacol or Chloraseptic lozenges   Vics Vapo Rub or other mentholated rubs may help relieve symptoms when used per instructions on the packaging.   **Many medications will have more than one ingredient, be sure you are reading the packaging carefully and not taking more than one dose of the same kind of medication at the same time or too close together. It is OK to use formulas that have all of the ingredients you want, but do not take them in a combined medication and as separate dose too close together. If you have any questions, the pharmacist will be happy to help you decide what is safe.  IF YOU EXPERIENCE SHORTNESS OF BREATH AT REST, DIZZINESS, OR A CHANGE IN MENTAL STATUS YOU NEED TO SEEK EMERGENCY CARE IMMEDIATELY.   Be sure you are resting well and drinking plenty of water.   If your symptoms begin to improve then worsen again, please let us know.   If you develop shortness of breath, wheezing, or feeling like you cannot get enough air, please seek emergency care immediately.   Please quarantine for a minimum of 10 days from the first day of your symptoms AND until you have been fever free for >24 hours without the use of tylenol or ibuprofen AND your symptoms are improving.  Please let us know if you have any questions.

## 2020-02-19 ENCOUNTER — Ambulatory Visit (INDEPENDENT_AMBULATORY_CARE_PROVIDER_SITE_OTHER): Payer: Self-pay | Admitting: Nurse Practitioner

## 2020-02-19 DIAGNOSIS — Z20822 Contact with and (suspected) exposure to covid-19: Secondary | ICD-10-CM

## 2020-02-19 NOTE — Progress Notes (Signed)
Covid test

## 2020-02-21 LAB — SARS-COV-2, NAA 2 DAY TAT

## 2020-02-21 LAB — NOVEL CORONAVIRUS, NAA: SARS-CoV-2, NAA: NOT DETECTED

## 2020-02-22 NOTE — Progress Notes (Signed)
COVID test negative. This is most likely a viral infection that will take time to pass.   If your symptoms do not improve or get better then worse again, please let us know. If you have symptoms lasting more than 7 days, we may need to start an antibiotic to treat a suspected secondary bacterial infection.

## 2020-03-05 DIAGNOSIS — M794 Hypertrophy of (infrapatellar) fat pad: Secondary | ICD-10-CM | POA: Insufficient documentation

## 2020-04-11 ENCOUNTER — Telehealth (INDEPENDENT_AMBULATORY_CARE_PROVIDER_SITE_OTHER): Payer: Self-pay | Admitting: Family Medicine

## 2020-04-11 DIAGNOSIS — Z5329 Procedure and treatment not carried out because of patient's decision for other reasons: Secondary | ICD-10-CM

## 2020-04-11 DIAGNOSIS — Z91199 Patient's noncompliance with other medical treatment and regimen due to unspecified reason: Secondary | ICD-10-CM

## 2020-04-11 NOTE — Progress Notes (Signed)
Attempted to contact patient multiple times with no answer and did not connect to mychart visit.

## 2020-04-11 NOTE — Progress Notes (Signed)
Attempted to contact patient @ 1000am. LVM with callback information.   Re-attempted contact at 10:098am

## 2020-05-07 ENCOUNTER — Telehealth: Payer: BC Managed Care – PPO | Admitting: Family

## 2020-05-07 DIAGNOSIS — R059 Cough, unspecified: Secondary | ICD-10-CM | POA: Diagnosis not present

## 2020-05-07 DIAGNOSIS — Z8616 Personal history of COVID-19: Secondary | ICD-10-CM

## 2020-05-07 MED ORDER — BENZONATATE 100 MG PO CAPS: 100.0000 mg | ORAL_CAPSULE | Freq: Three times a day (TID) | ORAL | 0 refills | Status: DC | PRN

## 2020-05-07 MED ORDER — ALBUTEROL SULFATE HFA 108 (90 BASE) MCG/ACT IN AERS: 2.0000 | INHALATION_SPRAY | Freq: Four times a day (QID) | RESPIRATORY_TRACT | 0 refills | Status: DC | PRN

## 2020-05-07 MED ORDER — PREDNISONE 10 MG (21) PO TBPK: ORAL_TABLET | ORAL | 0 refills | Status: DC

## 2020-05-07 NOTE — Progress Notes (Signed)
E-Visit for Corona Virus Screening  We are sorry you are not feeling well. We are here to help!  You have tested positive for COVID-19, meaning that you were infected with the novel coronavirus and could give the virus to others.  It is vitally important that you stay home so you do not spread it to others.      Please continue isolation at home, for at least 10 days since the start of your symptoms and until you have had 24 hours with no fever (without taking a fever reducer) and with improving of symptoms.  If you have no symptoms but tested positive (or all symptoms resolve after 5 days and you have no fever) you can leave your house but continue to wear a mask around others for an additional 5 days. If you have a fever,continue to stay home until you have had 24 hours of no fever. Most cases improve 5-10 days from onset but we have seen a small number of patients who have gotten worse after the 10 days.  Please be sure to watch for worsening symptoms and remain taking the proper precautions.   Go to the nearest hospital ED for assessment if fever/cough/breathlessness are severe or illness seems like a threat to life.    The following symptoms may appear 2-14 days after exposure: . Fever . Cough . Shortness of breath or difficulty breathing . Chills . Repeated shaking with chills . Muscle pain . Headache . Sore throat . New loss of taste or smell . Fatigue . Congestion or runny nose . Nausea or vomiting . Diarrhea  You have been enrolled in [place] for COVID-19. Daily you will receive a questionnaire within the [facility] website. Our COVID-19 response team will be monitoring your responses daily.  You can use medication such as A prescription cough medication called Tessalon Perles 100 mg. You may take 1-2 capsules every 8 hours as needed for cough and A prescription inhaler called Albuterol MDI 90 mcg /actuation 2 puffs every 4 hours as needed for shortness of breath,  wheezing, cough and prednisone dose pack.     You may also take acetaminophen (Tylenol) as needed for fever.  HOME CARE: . Only take medications as instructed by your medical team. . Drink plenty of fluids and get plenty of rest. . A steam or ultrasonic humidifier can help if you have congestion.   GET HELP RIGHT AWAY IF YOU HAVE EMERGENCY WARNING SIGNS.  Call 911 or proceed to your closest emergency facility if: . You develop worsening high fever. . Trouble breathing . Bluish lips or face . Persistent pain or pressure in the chest . New confusion . Inability to wake or stay awake . You cough up blood. . Your symptoms become more severe . Inability to hold down food or fluids  This list is not all possible symptoms. Contact your medical provider for any symptoms that are severe or concerning to you.    Your e-visit answers were reviewed by a board certified advanced clinical practitioner to complete your personal care plan.  Depending on the condition, your plan could have included both over the counter or prescription medications.  If there is a problem please reply once you have received a response from your provider.  Your safety is important to [place].  If you have drug allergies check your prescription carefully.    You can use MyChart to ask questions about today's visit, request a non-urgent call back, or ask for  a work or school excuse for 24 hours related to this e-Visit. If it has been greater than 24 hours you will need to follow up with your provider, or enter a new e-Visit to address those concerns. You will get an e-mail in the next two days asking about your experience.  I hope that your e-visit has been valuable and will speed your recovery. Thank you for using e-visits.   Approximately 5 minutes was spent documenting and reviewing patient's chart.

## 2020-06-11 ENCOUNTER — Telehealth: Payer: BC Managed Care – PPO | Admitting: Nurse Practitioner

## 2020-06-11 DIAGNOSIS — J301 Allergic rhinitis due to pollen: Secondary | ICD-10-CM | POA: Diagnosis not present

## 2020-06-11 MED ORDER — PREDNISONE 20 MG PO TABS: 40.0000 mg | ORAL_TABLET | Freq: Every day | ORAL | 0 refills | Status: AC

## 2020-06-11 NOTE — Progress Notes (Signed)
E visit for Allergic Rhinitis We are sorry that you are not feeling well.  Here is how we plan to help!  Based on what you have shared with me it looks like you have Allergic Rhinitis.  Rhinitis is when a reaction occurs that causes nasal congestion, runny nose, sneezing, and itching.  Most types of rhinitis are caused by an inflammation and are associated with symptoms in the eyes ears or throat. There are several types of rhinitis.  The most common are acute rhinitis, which is usually caused by a viral illness, allergic or seasonal rhinitis, and nonallergic or year-round rhinitis.  Nasal allergies occur certain times of the year.  Allergic rhinitis is caused when allergens in the air trigger the release of histamine in the body.  Histamine causes itching, swelling, and fluid to build up in the fragile linings of the nasal passages, sinuses and eyelids.  An itchy nose and clear discharge are common.  I recommend the following over the counter treatments: Clarinex 5 mg take 1 tablet daily (NOTE: Do not take if pregnant or breastfeeding)  I also would recommend a nasal spray: Flonase 2 sprays into each nostril once daily  You may also benefit from eye drops such as: Systane 1-2 driops each eye twice daily as needed   I have sent in prednisone 20mg  1 tablets po qd for 5 days.  HOME CARE:   You can use an over-the-counter saline nasal spray as needed  Avoid areas where there is heavy dust, mites, or molds  Stay indoors on windy days during the pollen season  Keep windows closed in home, at least in bedroom; use air conditioner.  Use high-efficiency house air filter  Keep windows closed in car, turn AC on re-circulate  Avoid playing out with dog during pollen season  GET HELP RIGHT AWAY IF:   If your symptoms do not improve within 10 days  You become short of breath  You develop yellow or green discharge from your nose for over 3 days  You have coughing fits  MAKE SURE  YOU:   Understand these instructions  Will watch your condition  Will get help right away if you are not doing well or get worse  Thank you for choosing an e-visit. Your e-visit answers were reviewed by a board certified advanced clinical practitioner to complete your personal care plan. Depending upon the condition, your plan could have included both over the counter or prescription medications. Please review your pharmacy choice. Be sure that the pharmacy you have chosen is open so that you can pick up your prescription now.  If there is a problem you may message your provider in [place] to have the prescription routed to another pharmacy. Your safety is important to [place]. If you have drug allergies check your prescription carefully.  For the next 24 hours, you can use MyChart to ask questions about today's visit, request a non-urgent call back, or ask for a work or school excuse from your e-visit provider. You will get an email in the next two days asking about your experience. I hope that your e-visit has been valuable and will speed your recovery.   5-10 minutes spent reviewing and documenting in chart.

## 2020-06-17 ENCOUNTER — Encounter: Payer: Self-pay | Admitting: Family Medicine

## 2020-07-28 ENCOUNTER — Telehealth: Payer: BC Managed Care – PPO | Admitting: Physician Assistant

## 2020-07-28 DIAGNOSIS — B372 Candidiasis of skin and nail: Secondary | ICD-10-CM | POA: Diagnosis not present

## 2020-07-28 DIAGNOSIS — L259 Unspecified contact dermatitis, unspecified cause: Secondary | ICD-10-CM | POA: Diagnosis not present

## 2020-07-28 MED ORDER — NYSTATIN 100000 UNIT/GM EX CREA: 1.0000 "application " | TOPICAL_CREAM | Freq: Two times a day (BID) | CUTANEOUS | 0 refills | Status: DC

## 2020-07-28 MED ORDER — TRIAMCINOLONE ACETONIDE 0.1 % EX CREA: TOPICAL_CREAM | CUTANEOUS | 0 refills | Status: DC

## 2020-07-28 NOTE — Progress Notes (Signed)
I have spent 5 minutes in review of e-visit questionnaire, review and updating patient chart, medical decision making and response to patient.   Simya Tercero Cody Kiersten Coss, PA-C    

## 2020-07-28 NOTE — Progress Notes (Signed)
E Visit for Rash We are sorry that you are not feeling well. Here is how we plan to help! Based on what you have shared with me there seems to be two different types of rash present. For the area under the arm/in the armpit, this is most consistent with a yeast infection giving shape, appearance of rash and the smell you have noted. I have sent in a topical Nystatin cream to apply to the area as directed. Try to keep the skin clean and dry. Avoid shaving in the affected area until this resolves. When you do, use a new razor blade.   For the leg, this seems more consistent with an irritant or allergic contact dermatitis. Keep skin clean and dry. I have sent in a steroid cream (Triamcinolone) to apply to the area twice daily for the next 7-10 days. If no major improvement/resolution in that time, please follow-up with your primary care provider.   HOME CARE:   Take cool showers and avoid direct sunlight.  Apply cool compress or wet dressings.  Take a bath in an oatmeal bath.  Sprinkle content of one Aveeno packet under running faucet with comfortably warm water.  Bathe for 15-20 minutes, 1-2 times daily.  Pat dry with a towel. Do not rub the rash.  Use hydrocortisone cream.  Take an antihistamine like Benadryl for widespread rashes that itch.  The adult dose of Benadryl is 25-50 mg by mouth 4 times daily.  Caution:  This type of medication may cause sleepiness.  Do not drink alcohol, drive, or operate dangerous machinery while taking antihistamines.  Do not take these medications if you have prostate enlargement.  Read package instructions thoroughly on all medications that you take.  GET HELP RIGHT AWAY IF:   Symptoms don't go away after treatment.  Severe itching that persists.  If you rash spreads or swells.  If you rash begins to smell.  If it blisters and opens or develops a yellow-brown crust.  You develop a fever.  You have a sore throat.  You become short of breath.  MAKE  SURE YOU:  Understand these instructions. Will watch your condition. Will get help right away if you are not doing well or get worse.  Thank you for choosing an e-visit. Your e-visit answers were reviewed by a board certified advanced clinical practitioner to complete your personal care plan. Depending upon the condition, your plan could have included both over the counter or prescription medications. Please review your pharmacy choice. Be sure that the pharmacy you have chosen is open so that you can pick up your prescription now.  If there is a problem you may message your provider in [place] to have the prescription routed to another pharmacy. Your safety is important to [place]. If you have drug allergies check your prescription carefully.  For the next 24 hours, you can use MyChart to ask questions about today's visit, request a non-urgent call back, or ask for a work or school excuse from your e-visit provider. You will get an email in the next two days asking about your experience. I hope that your e-visit has been valuable and will speed your recovery.

## 2020-08-15 ENCOUNTER — Telehealth: Payer: Self-pay | Admitting: *Deleted

## 2020-08-15 ENCOUNTER — Ambulatory Visit: Payer: BC Managed Care – PPO | Admitting: Certified Nurse Midwife
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
## 2020-09-19 IMAGING — CT CT ABDOMEN AND PELVIS WITH CONTRAST
2 of 4 series · 16 of 46 positions shown, 18 images · IV contrast (omnipaque)
Comparison: CT Abdomen and Pelvis [DATE] [REDACTED] [HOSPITAL] [HOSPITAL]

CLINICAL DATA: 33-year-old female with epigastric abdominal pain,
persistent since [REDACTED].

EXAM:
CT ABDOMEN AND PELVIS WITH CONTRAST
TECHNIQUE: Multidetector CT imaging of the abdomen and pelvis was performed
using the standard protocol following bolus administration of
intravenous contrast.
CONTRAST:  100mL OMNIPAQUE IOHEXOL 300 MG/ML  SOLN

[Series 2: axial st · axial · 0.88mm/px · z∈[-404,+56]mm · 13 of 100 slices shown, 15 images]
[im 4/100  soft-tissue]
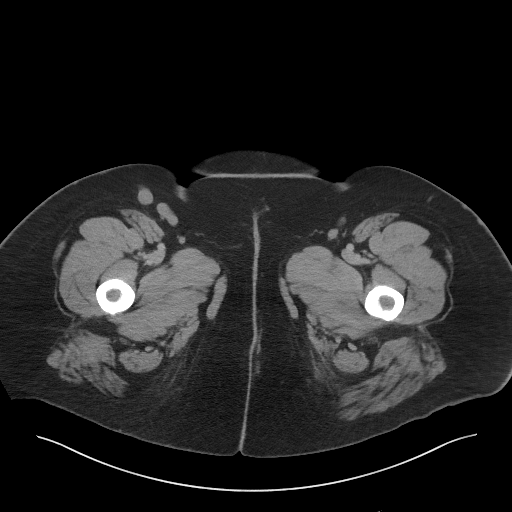
[im 4/100  bone]
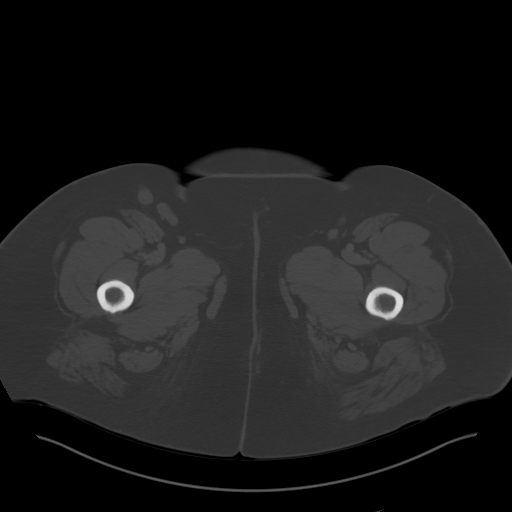
[im 12/100  soft-tissue]
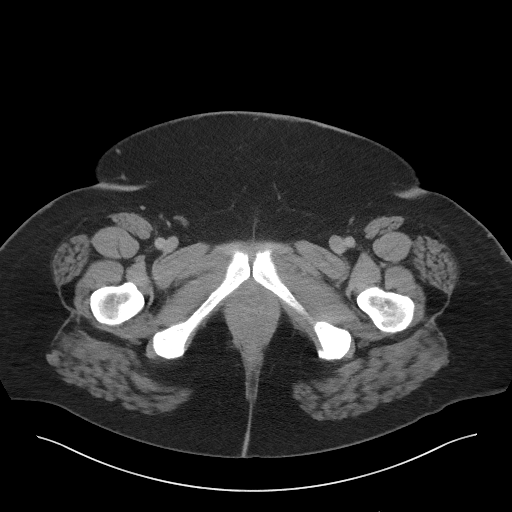
[im 20/100  soft-tissue]
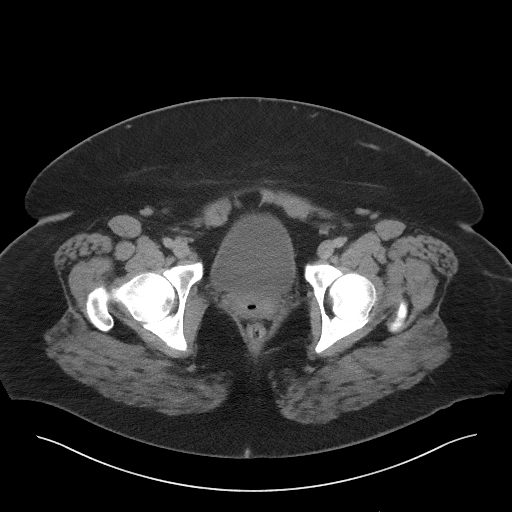
[im 27/100  soft-tissue]
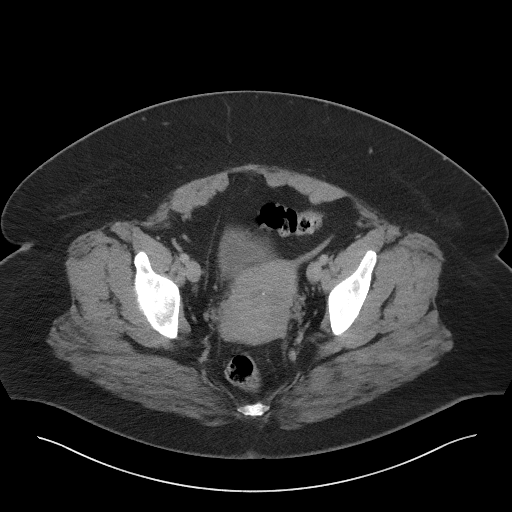
[im 35/100  soft-tissue]
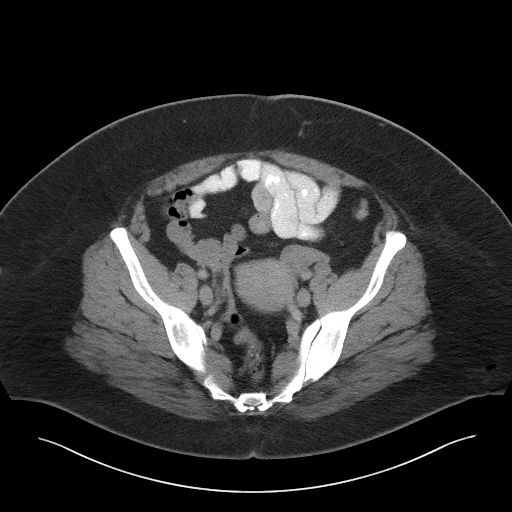
[im 42/100  soft-tissue]
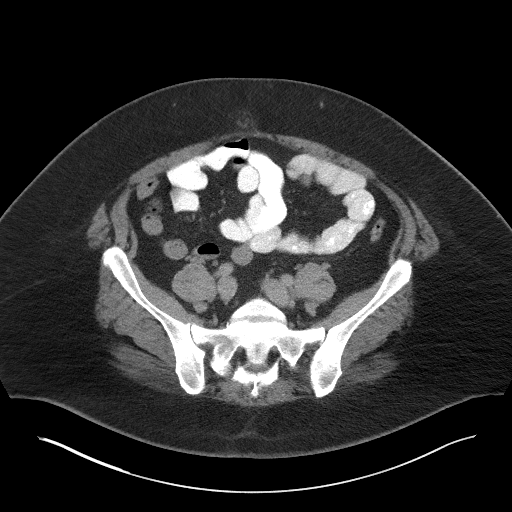
[im 50/100  soft-tissue]
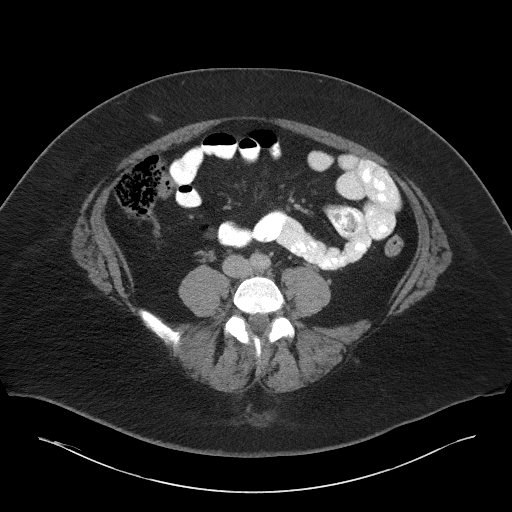
[im 58/100  soft-tissue]
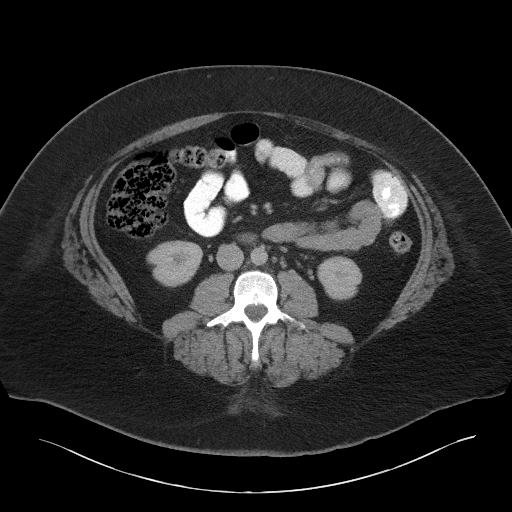
[im 65/100  soft-tissue]
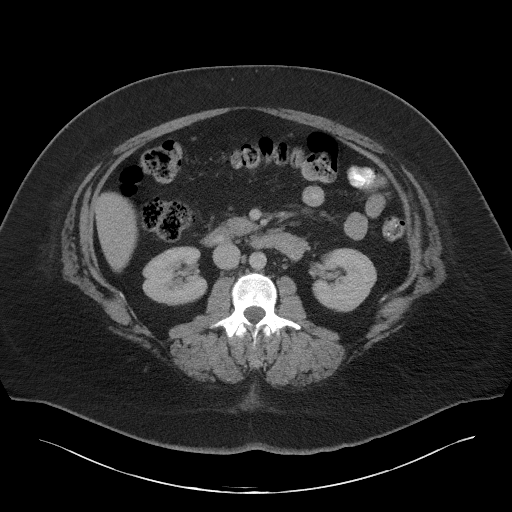
[im 65/100  bone]
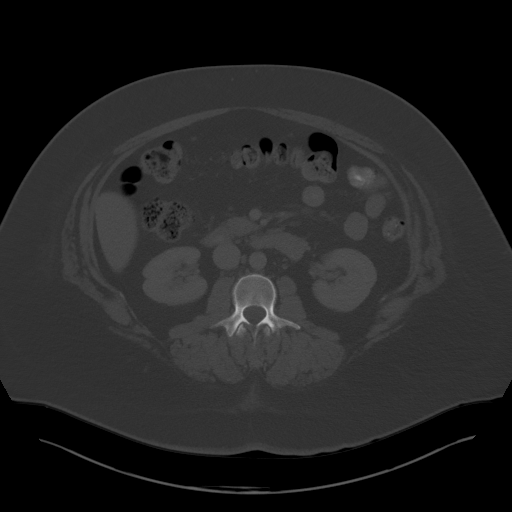
[im 73/100  soft-tissue]
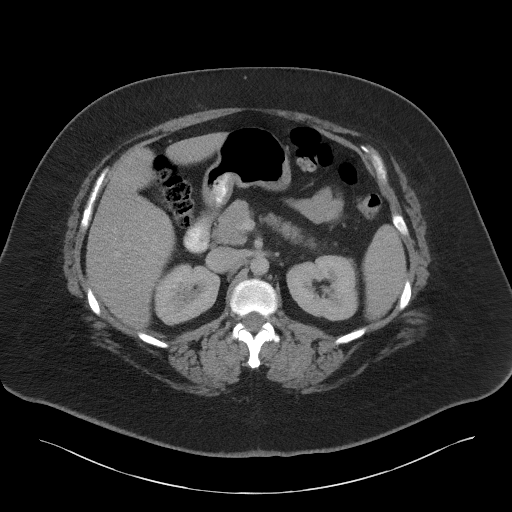
[im 80/100  soft-tissue]
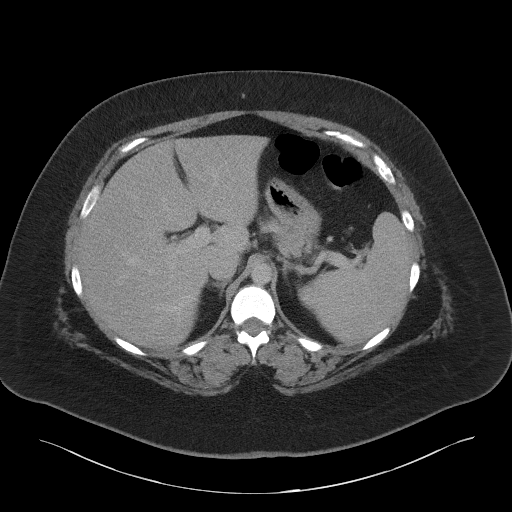
[im 88/100  soft-tissue]
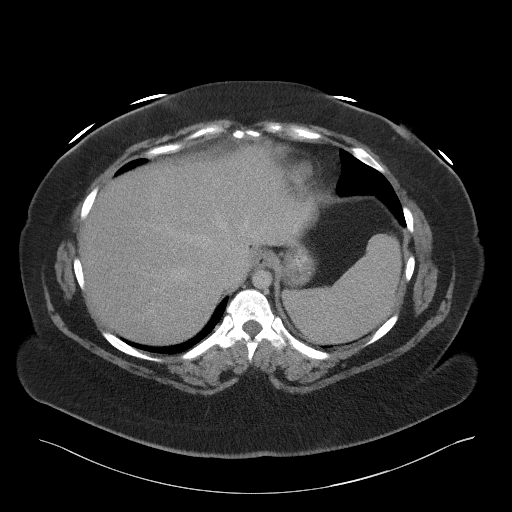
[im 96/100  soft-tissue]
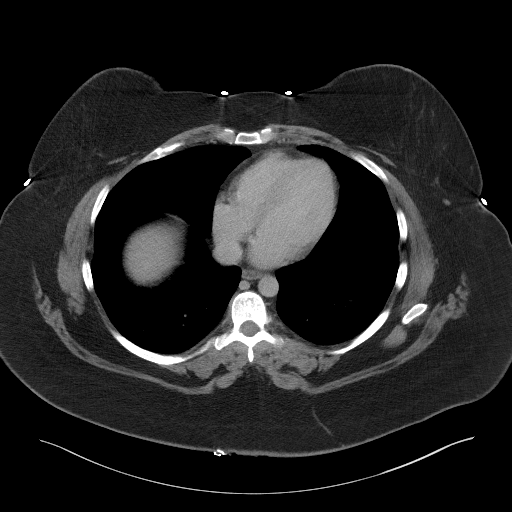

[Series 5: coronal st · coronal · 0.83mm/px · 3 of 109 slices shown]
[im 37/109  soft-tissue]
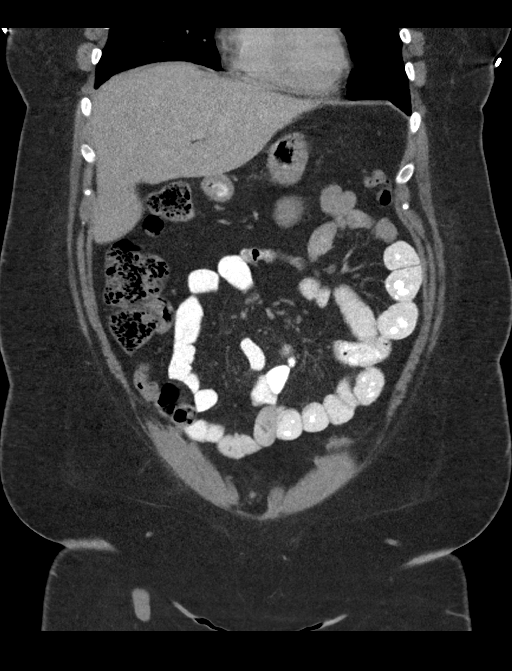
[im 49/109  soft-tissue]
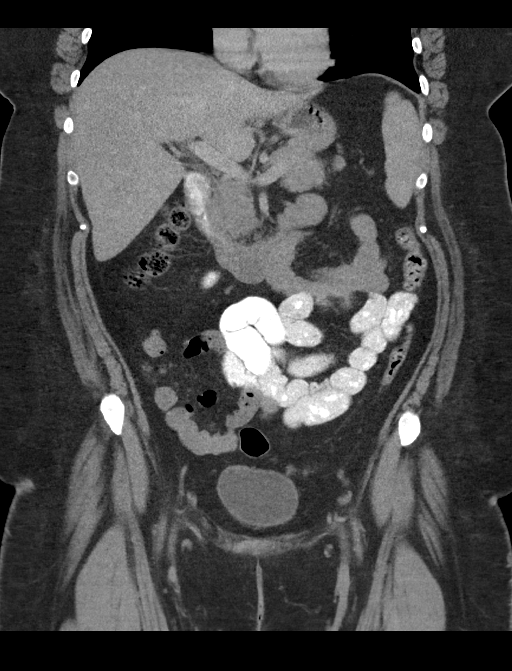
[im 61/109  soft-tissue]
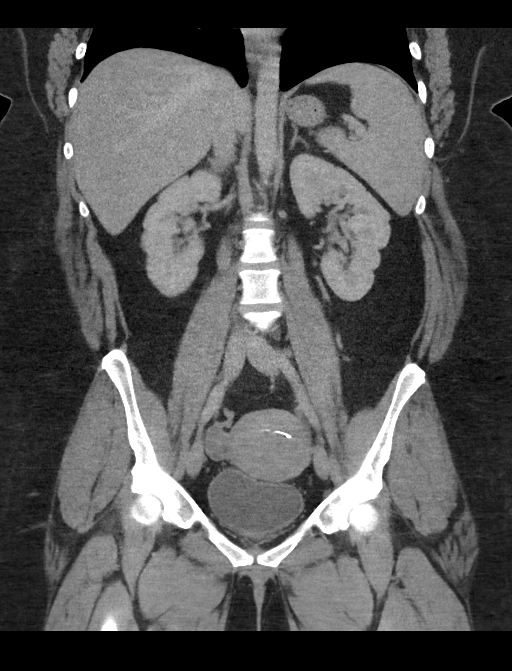

[16 of 46 positions shown; findings below may reference images not displayed]

FINDINGS: Lower chest: Negative.

Hepatobiliary: Surgically absent gallbladder. Negative liver.

Pancreas: Negative.

Spleen: Negative.

Adrenals/Urinary Tract: Normal adrenal glands.

Symmetric and normal bilateral renal enhancement. No perinephric
stranding or hydronephrosis. Normal proximal ureters. Unremarkable
urinary bladder. Occasional pelvic phleboliths.

Stomach/Bowel: Negative descending and rectosigmoid colon. Negative
transverse and right colon aside from some retained stool. Normal
appendix visible on coronal image 51. Negative terminal ileum. Oral
contrast has not yet reached the distal small bowel. No dilated or
abnormal small bowel loops. Decompressed and negative stomach,
duodenum. No free air, free fluid.

Vascular/Lymphatic: Suboptimal intravascular contrast bolus but the
major arterial structures appear patent and normal. Portal venous
system appears patent.

There are venous collaterals partially visible in the anterior upper
thighs, more so on the right (series 2, image 99). But the central
pelvic veins and IVC appear to be patent.

No lymphadenopathy.

Reproductive: Stable, IUD remains in place.

Other: No pelvic free fluid.

Musculoskeletal: Stable endplate and facet degeneration at the
lumbosacral junction. Otherwise negative.
IMPRESSION: 1. No acute or inflammatory process identified in the abdomen or
pelvis. Normal appendix.
2. Partially visible chronic venous collaterals in the anterior
upper thighs, more so on the right. The major vascular structures in
the abdomen and pelvis appear patent.
3. Chronic IUD.

## 2020-09-28 ENCOUNTER — Telehealth: Payer: BC Managed Care – PPO | Admitting: Orthopedic Surgery

## 2020-09-28 DIAGNOSIS — R059 Cough, unspecified: Secondary | ICD-10-CM

## 2020-09-28 MED ORDER — BENZONATATE 100 MG PO CAPS: 100.0000 mg | ORAL_CAPSULE | Freq: Three times a day (TID) | ORAL | 0 refills | Status: DC | PRN

## 2020-09-28 MED ORDER — PREDNISONE 20 MG PO TABS: 40.0000 mg | ORAL_TABLET | Freq: Every day | ORAL | 0 refills | Status: AC

## 2020-09-28 NOTE — Progress Notes (Signed)
We are sorry that you are not feeling well.  Here is how we plan to help!  Based on your presentation I believe you most likely have A cough due to a virus.  This is called viral bronchitis and is best treated by rest, plenty of fluids and control of the cough.  You may use Ibuprofen or Tylenol as directed to help your symptoms.     In addition you may use A prescription cough medication called Tessalon Perles 100mg . You may take 1-2 capsules every 8 hours as needed for your cough.  Prednisone 40 mg daily for 5 days   From your responses in the eVisit questionnaire you describe inflammation in the upper respiratory tract which is causing a significant cough.  This is commonly called Bronchitis and has four common causes:   Allergies Viral Infections Acid Reflux Bacterial Infection Allergies, viruses and acid reflux are treated by controlling symptoms or eliminating the cause. An example might be a cough caused by taking certain blood pressure medications. You stop the cough by changing the medication. Another example might be a cough caused by acid reflux. Controlling the reflux helps control the cough.  USE OF BRONCHODILATOR ("RESCUE") INHALERS: There is a risk from using your bronchodilator too frequently.  The risk is that over-reliance on a medication which only relaxes the muscles surrounding the breathing tubes can reduce the effectiveness of medications prescribed to reduce swelling and congestion of the tubes themselves.  Although you feel brief relief from the bronchodilator inhaler, your asthma may actually be worsening with the tubes becoming more swollen and filled with mucus.  This can delay other crucial treatments, such as oral steroid medications. If you need to use a bronchodilator inhaler daily, several times per day, you should discuss this with your provider.  There are probably better treatments that could be used to keep your asthma under control.     HOME CARE Only take  medications as instructed by your medical team. Complete the entire course of an antibiotic. Drink plenty of fluids and get plenty of rest. Avoid close contacts especially the very young and the elderly Cover your mouth if you cough or cough into your sleeve. Always remember to wash your hands A steam or ultrasonic humidifier can help congestion.   GET HELP RIGHT AWAY IF: You develop worsening fever. You become short of breath You cough up blood. Your symptoms persist after you have completed your treatment plan MAKE SURE YOU  Understand these instructions. Will watch your condition. Will get help right away if you are not doing well or get worse.    Thank you for choosing an e-visit.  Your e-visit answers were reviewed by a board certified advanced clinical practitioner to complete your personal care plan. Depending upon the condition, your plan could have included both over the counter or prescription medications.  Please review your pharmacy choice. Make sure the pharmacy is open so you can pick up prescription now. If there is a problem, you may contact your provider through [employer] and have the prescription routed to another pharmacy.  Your safety is important to [place]. If you have drug allergies check your prescription carefully.   For the next 24 hours you can use MyChart to ask questions about today's visit, request a non-urgent call back, or ask for a work or school excuse. You will get an email in the next two days asking about your experience. I hope that your e-visit has been valuable and will  speed your recovery.  Greater than 5 minutes, yet less than 10 minutes of time have been spent researching, coordinating and implementing care for this patient today.

## 2020-10-28 ENCOUNTER — Telehealth: Payer: Self-pay | Admitting: Physician Assistant

## 2020-10-28 DIAGNOSIS — R053 Chronic cough: Secondary | ICD-10-CM

## 2020-10-28 NOTE — Progress Notes (Signed)
Based on what you shared with me, I feel your condition warrants further evaluation and I recommend that you be seen for a face to face visit.  Please contact your primary care physician practice to be seen. Many offices offer virtual options to be seen via video if you are not comfortable going in person to a medical facility at this time.  Giving chronicity of cough despite prior treatment, we do need you to follow-up with your primary care office so that they can get a further evaluation above what can be provided through this visit type. This is to make sure you get proper treatment and nothing is missed!  NOTE: You will NOT be charged for this eVisit.  If you do not have a PCP, [facility] offers a free physician referral service available at [phone]. Our trained staff has the experience, knowledge and resources to put you in touch with a physician who is right for you.    If you are having a true medical emergency please call 911.   Your e-visit answers were reviewed by a board certified advanced clinical practitioner to complete your personal care plan.  Thank you for using e-Visits.

## 2021-03-28 ENCOUNTER — Telehealth: Payer: Self-pay | Admitting: Emergency Medicine

## 2021-03-28 DIAGNOSIS — M549 Dorsalgia, unspecified: Secondary | ICD-10-CM

## 2021-03-29 MED ORDER — CYCLOBENZAPRINE HCL 10 MG PO TABS: 10.0000 mg | ORAL_TABLET | Freq: Three times a day (TID) | ORAL | 0 refills | Status: DC | PRN

## 2021-03-29 MED ORDER — PREDNISONE 10 MG (21) PO TBPK: ORAL_TABLET | Freq: Every day | ORAL | 0 refills | Status: DC

## 2021-03-29 NOTE — Progress Notes (Signed)
I have spent 5 minutes in review of e-visit questionnaire, review and updating patient chart, medical decision making and response to patient.   Izayah Miner, PA-C    

## 2021-03-29 NOTE — Progress Notes (Signed)

## 2021-04-15 ENCOUNTER — Telehealth: Payer: Self-pay | Admitting: General Practice

## 2021-04-15 NOTE — Telephone Encounter (Signed)
Transition Care Management Follow-up Telephone Call Date of discharge and from where: 04/13/21 from [facility] How have you been since you were released from the hospital? Still having headaches. Has to follow up with the workers comp. Doctors. She will have records faxed over. Any questions or concerns? No  Items Reviewed: Did the pt receive and understand the discharge instructions provided? Yes  Medications obtained and verified? Yes  Other? No  Any new allergies since your discharge? No  Dietary orders reviewed? Yes Do you have support at home? Yes   Home Care and Equipment/Supplies: Were home health services ordered? no  Functional Questionnaire: (I = Independent and D = Dependent) ADLs: I  Bathing/Dressing- I  Meal Prep- I  Eating- I  Maintaining continence- I  Transferring/Ambulation- I  Managing Meds- I  Follow up appointments reviewed:  PCP Hospital f/u appt confirmed? No   Specialist Hospital f/u appt confirmed? No   Are transportation arrangements needed? No  If their condition worsens, is the pt aware to call PCP or go to the Emergency Dept.? Yes Was the patient provided with contact information for the PCP's office or ED? Yes Was to pt encouraged to call back with questions or concerns? Yes

## 2021-06-11 ENCOUNTER — Telehealth: Payer: Self-pay | Admitting: Family Medicine

## 2021-06-11 DIAGNOSIS — R051 Acute cough: Secondary | ICD-10-CM

## 2021-06-11 MED ORDER — ALBUTEROL SULFATE HFA 108 (90 BASE) MCG/ACT IN AERS: 2.0000 | INHALATION_SPRAY | Freq: Four times a day (QID) | RESPIRATORY_TRACT | 0 refills | Status: DC | PRN

## 2021-06-11 MED ORDER — PROMETHAZINE-DM 6.25-15 MG/5ML PO SYRP: 2.5000 mL | ORAL_SOLUTION | Freq: Four times a day (QID) | ORAL | 0 refills | Status: DC | PRN

## 2021-06-11 NOTE — Progress Notes (Signed)
We are sorry that you are not feeling well.  Here is how we plan to help! ? ?Based on your presentation I believe you most likely have A cough due to allergies.  I recommend that you start the an over-the counter-allergy medication such as Claritin 10 mg or Zyrtec 10 mg daily.  And FLonase ?  ?In addition you may use promethazine DM and an inhaler as well.  ? ?From your responses in the eVisit questionnaire you describe inflammation in the upper respiratory tract which is causing a significant cough.  This is commonly called Bronchitis and has four common causes:   ?Allergies ?Viral Infections ?Acid Reflux ?Bacterial Infection ?Allergies, viruses and acid reflux are treated by controlling symptoms or eliminating the cause. An example might be a cough caused by taking certain blood pressure medications. You stop the cough by changing the medication. Another example might be a cough caused by acid reflux. Controlling the reflux helps control the cough. ? ?USE OF BRONCHODILATOR ("RESCUE") INHALERS: ?There is a risk from using your bronchodilator too frequently.  The risk is that over-reliance on a medication which only relaxes the muscles surrounding the breathing tubes can reduce the effectiveness of medications prescribed to reduce swelling and congestion of the tubes themselves.  Although you feel brief relief from the bronchodilator inhaler, your asthma may actually be worsening with the tubes becoming more swollen and filled with mucus.  This can delay other crucial treatments, such as oral steroid medications. If you need to use a bronchodilator inhaler daily, several times per day, you should discuss this with your provider.  There are probably better treatments that could be used to keep your asthma under control.  ?   ?HOME CARE ?Only take medications as instructed by your medical team. ?Complete the entire course of an antibiotic. ?Drink plenty of fluids and get plenty of rest. ?Avoid close contacts  especially the very young and the elderly ?Cover your mouth if you cough or cough into your sleeve. ?Always remember to wash your hands ?A steam or ultrasonic humidifier can help congestion.  ? ?GET HELP RIGHT AWAY IF: ?You develop worsening fever. ?You become short of breath ?You cough up blood. ?Your symptoms persist after you have completed your treatment plan ?MAKE SURE YOU  ?Understand these instructions. ?Will watch your condition. ?Will get help right away if you are not doing well or get worse. ?  ? ?Thank you for choosing an e-visit. ? ?Your e-visit answers were reviewed by a board certified advanced clinical practitioner to complete your personal care plan. Depending upon the condition, your plan could have included both over the counter or prescription medications. ? ?Please review your pharmacy choice. Make sure the pharmacy is open so you can pick up prescription now. If there is a problem, you may contact your provider through [employer] and have the prescription routed to another pharmacy.  Your safety is important to [place]. If you have drug allergies check your prescription carefully.  ? ?For the next 24 hours you can use MyChart to ask questions about today's visit, request a non-urgent call back, or ask for a work or school excuse. ?You will get an email in the next two days asking about your experience. I hope that your e-visit has been valuable and will speed your recovery. ? ? ? ? ?I provided 5 minutes of non face-to-face time during this encounter for chart review, medication and order placement, as well as and documentation.  ? ?

## 2021-06-17 ENCOUNTER — Telehealth: Payer: Self-pay | Admitting: Physician Assistant

## 2021-06-17 DIAGNOSIS — B9689 Other specified bacterial agents as the cause of diseases classified elsewhere: Secondary | ICD-10-CM

## 2021-06-17 DIAGNOSIS — J208 Acute bronchitis due to other specified organisms: Secondary | ICD-10-CM

## 2021-06-17 MED ORDER — AZITHROMYCIN 250 MG PO TABS: ORAL_TABLET | ORAL | 0 refills | Status: AC

## 2021-06-17 NOTE — Progress Notes (Signed)
I have spent 5 minutes in review of e-visit questionnaire, review and updating patient chart, medical decision making and response to patient.   Taren Dymek Cody Maurissa Ambrose, PA-C    

## 2021-06-17 NOTE — Progress Notes (Signed)
We are sorry that you are not feeling well.  Here is how we plan to help! ? ?Based on your presentation I believe you most likely have A cough due to bacteria.  When patients have a fever and a productive cough with a change in color or increased sputum production, we are concerned about bacterial bronchitis.  If left untreated it can progress to pneumonia.  If your symptoms do not improve with your treatment plan it is important that you contact your provider.   I have prescribed Azithromyin 250 mg: two tablets now and then one tablet daily for 4 additonal days  ?  ?In addition you may use continue your prescription cough syrup and use of the albuterol sent in for you.  ? ? ?From your responses in the eVisit questionnaire you describe inflammation in the upper respiratory tract which is causing a significant cough.  This is commonly called Bronchitis and has four common causes:   ?Allergies ?Viral Infections ?Acid Reflux ?Bacterial Infection ?Allergies, viruses and acid reflux are treated by controlling symptoms or eliminating the cause. An example might be a cough caused by taking certain blood pressure medications. You stop the cough by changing the medication. Another example might be a cough caused by acid reflux. Controlling the reflux helps control the cough. ? ?USE OF BRONCHODILATOR ("RESCUE") INHALERS: ?There is a risk from using your bronchodilator too frequently.  The risk is that over-reliance on a medication which only relaxes the muscles surrounding the breathing tubes can reduce the effectiveness of medications prescribed to reduce swelling and congestion of the tubes themselves.  Although you feel brief relief from the bronchodilator inhaler, your asthma may actually be worsening with the tubes becoming more swollen and filled with mucus.  This can delay other crucial treatments, such as oral steroid medications. If you need to use a bronchodilator inhaler daily, several times per day, you should  discuss this with your provider.  There are probably better treatments that could be used to keep your asthma under control.  ?   ?HOME CARE ?Only take medications as instructed by your medical team. ?Complete the entire course of an antibiotic. ?Drink plenty of fluids and get plenty of rest. ?Avoid close contacts especially the very young and the elderly ?Cover your mouth if you cough or cough into your sleeve. ?Always remember to wash your hands ?A steam or ultrasonic humidifier can help congestion.  ? ?GET HELP RIGHT AWAY IF: ?You develop worsening fever. ?You become short of breath ?You cough up blood. ?Your symptoms persist after you have completed your treatment plan ?MAKE SURE YOU  ?Understand these instructions. ?Will watch your condition. ?Will get help right away if you are not doing well or get worse. ?  ? ?Thank you for choosing an e-visit. ? ?Your e-visit answers were reviewed by a board certified advanced clinical practitioner to complete your personal care plan. Depending upon the condition, your plan could have included both over the counter or prescription medications. ? ?Please review your pharmacy choice. Make sure the pharmacy is open so you can pick up prescription now. If there is a problem, you may contact your provider through [employer] and have the prescription routed to another pharmacy.  Your safety is important to [place]. If you have drug allergies check your prescription carefully.  ? ?For the next 24 hours you can use MyChart to ask questions about today's visit, request a non-urgent call back, or ask for a work or school excuse. ?  You will get an email in the next two days asking about your experience. I hope that your e-visit has been valuable and will speed your recovery. ? ?

## 2021-10-18 ENCOUNTER — Telehealth: Payer: Self-pay | Admitting: Family

## 2021-10-18 DIAGNOSIS — R399 Unspecified symptoms and signs involving the genitourinary system: Secondary | ICD-10-CM

## 2021-10-18 MED ORDER — SULFAMETHOXAZOLE-TRIMETHOPRIM 800-160 MG PO TABS: 1.0000 | ORAL_TABLET | Freq: Two times a day (BID) | ORAL | 0 refills | Status: DC

## 2021-10-18 NOTE — Progress Notes (Signed)

## 2021-12-07 ENCOUNTER — Telehealth: Payer: Self-pay | Admitting: Physician Assistant

## 2021-12-07 DIAGNOSIS — R3989 Other symptoms and signs involving the genitourinary system: Secondary | ICD-10-CM

## 2021-12-07 MED ORDER — SULFAMETHOXAZOLE-TRIMETHOPRIM 800-160 MG PO TABS: 1.0000 | ORAL_TABLET | Freq: Two times a day (BID) | ORAL | 0 refills | Status: DC

## 2021-12-07 NOTE — Progress Notes (Signed)

## 2021-12-18 ENCOUNTER — Telehealth: Payer: Self-pay | Admitting: General Practice

## 2021-12-18 NOTE — Telephone Encounter (Signed)
Transition Care Management Unsuccessful Follow-up Telephone Call  Date of discharge and from where:  12/16/21 from [facility]  Attempts:  1st Attempt  Reason for unsuccessful TCM follow-up call:  No answer/busy

## 2021-12-21 NOTE — Telephone Encounter (Signed)
Transition Care Management Unsuccessful Follow-up Telephone Call  Date of discharge and from where:  12/16/21 from [facility]  Attempts:  2nd Attempt  Reason for unsuccessful TCM follow-up call:  No answer/busy

## 2021-12-23 NOTE — Telephone Encounter (Signed)
Transition Care Management Unsuccessful Follow-up Telephone Call  Date of discharge and from where:  12/16/21 from [facility]  Attempts:  3rd Attempt  Reason for unsuccessful TCM follow-up call:  No answer/busy

## 2022-03-12 ENCOUNTER — Telehealth: Payer: Self-pay | Admitting: Physician Assistant

## 2022-03-12 DIAGNOSIS — R3989 Other symptoms and signs involving the genitourinary system: Secondary | ICD-10-CM

## 2022-03-12 MED ORDER — SULFAMETHOXAZOLE-TRIMETHOPRIM 800-160 MG PO TABS: 1.0000 | ORAL_TABLET | Freq: Two times a day (BID) | ORAL | 0 refills | Status: DC

## 2022-03-12 NOTE — Progress Notes (Signed)
E-Visit for Urinary Problems  We are sorry that you are not feeling well.  Here is how we plan to help!  Based on what you shared with me it looks like you most likely have a simple urinary tract infection.  A UTI (Urinary Tract Infection) is a bacterial infection of the bladder.  Most cases of urinary tract infections are simple to treat but a key part of your care is to encourage you to drink plenty of fluids and watch your symptoms carefully.  I have prescribed Bactrim DS One tablet twice a day for 5 days.  Your symptoms should gradually improve. Call us if the burning in your urine worsens, you develop worsening fever, back pain or pelvic pain or if your symptoms do not resolve after completing the antibiotic.  Urinary tract infections can be prevented by drinking plenty of water to keep your body hydrated.  Also be sure when you wipe, wipe from front to back and don't hold it in!  If possible, empty your bladder every 4 hours.  HOME CARE Drink plenty of fluids Compete the full course of the antibiotics even if the symptoms resolve Remember, when you need to go.go. Holding in your urine can increase the likelihood of getting a UTI! GET HELP RIGHT AWAY IF: You cannot urinate You get a high fever Worsening back pain occurs You see blood in your urine You feel sick to your stomach or throw up You feel like you are going to pass out  MAKE SURE YOU  Understand these instructions. Will watch your condition. Will get help right away if you are not doing well or get worse.   Thank you for choosing an e-visit.  Your e-visit answers were reviewed by a board certified advanced clinical practitioner to complete your personal care plan. Depending upon the condition, your plan could have included both over the counter or prescription medications.  Please review your pharmacy choice. Make sure the pharmacy is open so you can pick up prescription now. If there is a problem, you may contact  your provider through MyChart messaging and have the prescription routed to another pharmacy.  Your safety is important to us. If you have drug allergies check your prescription carefully.   For the next 24 hours you can use MyChart to ask questions about today's visit, request a non-urgent call back, or ask for a work or school excuse. You will get an email in the next two days asking about your experience. I hope that your e-visit has been valuable and will speed your recovery.  I have spent 5 minutes in review of e-visit questionnaire, review and updating patient chart, medical decision making and response to patient.   Marijah Larranaga M Jazmin Ley, PA-C  

## 2022-09-25 ENCOUNTER — Telehealth: Payer: Medicaid Other | Admitting: Nurse Practitioner

## 2022-09-25 DIAGNOSIS — L989 Disorder of the skin and subcutaneous tissue, unspecified: Secondary | ICD-10-CM | POA: Diagnosis not present

## 2022-09-25 NOTE — Progress Notes (Signed)
I have spent 5 minutes in review of e-visit questionnaire, review and updating patient chart, medical decision making and response to patient.  ° °Ramiyah Mcclenahan W Thamas Appleyard, NP ° °  °

## 2022-09-25 NOTE — Progress Notes (Signed)
E Visit for Cellulitis  We are sorry that you are not feeling well. Here is how we plan to help!  At this time based on the photos antibiotics would not be warranted. Please feel free to reach back out to [place] if the area worsens in redness, warmth or swelling.   GET HELP RIGHT AWAY IF:  Symptoms that don't begin to go away within 48 hours. Severe redness persists or worsens If the area turns color, spreads or swells. If it blisters and opens, develops yellow-brown crust or bleeds. You develop a fever or chills. If the pain increases or becomes unbearable.  Are unable to keep fluids and food down.  MAKE SURE YOU   Understand these instructions. Will watch your condition. Will get help right away if you are not doing well or get worse.  Thank you for choosing an e-visit.  Your e-visit answers were reviewed by a board certified advanced clinical practitioner to complete your personal care plan. Depending upon the condition, your plan could have included both over the counter or prescription medications.  Please review your pharmacy choice. Make sure the pharmacy is open so you can pick up prescription now. If there is a problem, you may contact your provider through [employer] and have the prescription routed to another pharmacy.  Your safety is important to [place]. If you have drug allergies check your prescription carefully.   For the next 24 hours you can use MyChart to ask questions about today's visit, request a non-urgent call back, or ask for a work or school excuse. You will get an email in the next two days asking about your experience. I hope that your e-visit has been valuable and will speed your recovery.

## 2022-10-21 ENCOUNTER — Telehealth: Payer: Medicaid Other | Admitting: Physician Assistant

## 2022-10-21 DIAGNOSIS — M549 Dorsalgia, unspecified: Secondary | ICD-10-CM | POA: Diagnosis not present

## 2022-10-21 MED ORDER — CYCLOBENZAPRINE HCL 10 MG PO TABS: 10.0000 mg | ORAL_TABLET | Freq: Three times a day (TID) | ORAL | 0 refills | Status: DC | PRN

## 2022-10-21 MED ORDER — PREDNISONE 10 MG (21) PO TBPK: ORAL_TABLET | ORAL | 0 refills | Status: DC

## 2022-10-21 NOTE — Progress Notes (Signed)

## 2022-10-21 NOTE — Progress Notes (Signed)
I have spent 5 minutes in review of e-visit questionnaire, review and updating patient chart, medical decision making and response to patient.   Mabel Unrein Cody Lacorey Brusca, PA-C    

## 2022-11-08 ENCOUNTER — Telehealth: Payer: Self-pay | Admitting: General Practice

## 2022-11-08 NOTE — Transitions of Care (Post Inpatient/ED Visit) (Signed)
   11/08/2022  Name: Ann Brock MRN: [id number] DOB: 12-Feb-1986  Today's TOC FU Call Status: Today's TOC FU Call Status:: Unsuccessful Call (1st Attempt) Unsuccessful Call (1st Attempt) Date: 11/08/22  Attempted to reach the patient regarding the most recent Inpatient/ED visit.  Follow Up Plan: Additional outreach attempts will be made to reach the patient to complete the Transitions of Care (Post Inpatient/ED visit) call.   Signature [place] Charon, Control and instrumentation engineer

## 2022-11-09 NOTE — Transitions of Care (Post Inpatient/ED Visit) (Signed)
   11/09/2022  Name: Ann Brock MRN: [id number] DOB: April 20, 1985  Today's TOC FU Call Status: Today's TOC FU Call Status:: Unsuccessful Call (2nd Attempt) Unsuccessful Call (1st Attempt) Date: 11/08/22 Unsuccessful Call (2nd Attempt) Date: 11/09/22  Attempted to reach the patient regarding the most recent Inpatient/ED visit.  Follow Up Plan: Additional outreach attempts will be made to reach the patient to complete the Transitions of Care (Post Inpatient/ED visit) call.   Signature [place] Charon, Control and instrumentation engineer

## 2022-11-10 NOTE — Transitions of Care (Post Inpatient/ED Visit) (Signed)
   11/10/2022  Name: Ann Brock MRN: [id number] DOB: Mar 11, 1986  Today's TOC FU Call Status: Today's TOC FU Call Status:: Unsuccessful Call (3rd Attempt) Unsuccessful Call (1st Attempt) Date: 11/08/22 Unsuccessful Call (2nd Attempt) Date: 11/09/22 Unsuccessful Call (3rd Attempt) Date: 11/10/22  Attempted to reach the patient regarding the most recent Inpatient/ED visit.  Follow Up Plan: No further outreach attempts will be made at this time. We have been unable to contact the patient.  Signature [place]esto Charon, Control and instrumentation engineer

## 2022-11-27 ENCOUNTER — Telehealth: Payer: Medicaid Other | Admitting: Family Medicine

## 2022-11-27 DIAGNOSIS — J029 Acute pharyngitis, unspecified: Secondary | ICD-10-CM

## 2022-11-27 MED ORDER — AZITHROMYCIN 250 MG PO TABS: ORAL_TABLET | ORAL | 0 refills | Status: AC

## 2022-11-27 NOTE — Progress Notes (Signed)
E-Visit for Sore Throat - Strep Symptoms  We are sorry that you are not feeling well.  Here is how we plan to help!  Based on what you have shared with me it is likely that you have strep pharyngitis.  Strep pharyngitis is inflammation and infection in the back of the throat.  This is an infection cause by bacteria and is treated with antibiotics.  I have prescribed Amoxicillin 500 mg twice a day for 10 days and Azithromycin 250 mg two tablets today and then one daily for 4 additional days. For throat pain, we recommend over the counter oral pain relief medications such as acetaminophen or aspirin, or anti-inflammatory medications such as ibuprofen or naproxen sodium. Topical treatments such as oral throat lozenges or sprays may be used as needed. Strep infections are not as easily transmitted as other respiratory infections, however we still recommend that you avoid close contact with loved ones, especially the very young and elderly.  Remember to wash your hands thoroughly throughout the day as this is the number one way to prevent the spread of infection and wipe down door knobs and counters with disinfectant.   Home Care: Only take medications as instructed by your medical team. Complete the entire course of an antibiotic. Do not take these medications with alcohol. A steam or ultrasonic humidifier can help congestion.  You can place a towel over your head and breathe in the steam from hot water coming from a faucet. Avoid close contacts especially the very young and the elderly. Cover your mouth when you cough or sneeze. Always remember to wash your hands.  Get Help Right Away If: You develop worsening fever or sinus pain. You develop a severe head ache or visual changes. Your symptoms persist after you have completed your treatment plan.  Make sure you Understand these instructions. Will watch your condition. Will get help right away if you are not doing well or get worse.   Thank  you for choosing an e-visit.  Your e-visit answers were reviewed by a board certified advanced clinical practitioner to complete your personal care plan. Depending upon the condition, your plan could have included both over the counter or prescription medications.  Please review your pharmacy choice. Make sure the pharmacy is open so you can pick up prescription now. If there is a problem, you may contact your provider through [employer] and have the prescription routed to another pharmacy.  Your safety is important to [place]. If you have drug allergies check your prescription carefully.   For the next 24 hours you can use MyChart to ask questions about today's visit, request a non-urgent call back, or ask for a work or school excuse. You will get an email in the next two days asking about your experience. I hope that your e-visit has been valuable and will speed your recovery.    have provided 5 minutes of non face to face time during this encounter for chart review and documentation.

## 2022-12-12 ENCOUNTER — Telehealth: Payer: Medicaid Other | Admitting: Family

## 2022-12-12 DIAGNOSIS — M5441 Lumbago with sciatica, right side: Secondary | ICD-10-CM

## 2022-12-12 MED ORDER — BACLOFEN 10 MG PO TABS: 10.0000 mg | ORAL_TABLET | Freq: Three times a day (TID) | ORAL | 0 refills | Status: DC

## 2022-12-12 MED ORDER — PREDNISONE 20 MG PO TABS: 40.0000 mg | ORAL_TABLET | Freq: Every day | ORAL | 0 refills | Status: AC

## 2022-12-12 NOTE — Progress Notes (Signed)
We are sorry that you are not feeling well.  Here is how we plan to help!  Based on what you have shared with me it looks like you mostly have acute back pain.  Acute back pain is defined as musculoskeletal pain that can resolve in 1-3 weeks with conservative treatment.  I have prescribed Baclofen 10 mg every eight hours as needed which is a muscle relaxer and prednisone 40 mg  daily.  Some patients experience stomach irritation or in increased heartburn with anti-inflammatory drugs.  Please keep in mind that muscle relaxer's can cause fatigue and should not be taken while at work or driving.  Back pain is very common.  The pain often gets better over time.  The cause of back pain is usually not dangerous.  Most people can learn to manage their back pain on their own.  Home Care Stay active.  Start with short walks on flat ground if you can.  Try to walk farther each day. Do not sit, drive or stand in one place for more than 30 minutes.  Do not stay in bed. Do not avoid exercise or work.  Activity can help your back heal faster. Be careful when you bend or lift an object.  Bend at your knees, keep the object close to you, and do not twist. Sleep on a firm mattress.  Lie on your side, and bend your knees.  If you lie on your back, put a pillow under your knees. Only take medicines as told by your doctor. Put ice on the injured area. Put ice in a plastic bag Place a towel between your skin and the bag Leave the ice on for 15-20 minutes, 3-4 times a day for the first 2-3 days. 210 After that, you can switch between ice and heat packs. Ask your doctor about back exercises or massage. Avoid feeling anxious or stressed.  Find good ways to deal with stress, such as exercise.  Get Help Right Way If: Your pain does not go away with rest or medicine. Your pain does not go away in 1 week. You have new problems. You do not feel well. The pain spreads into your legs. You cannot control when you poop  (bowel movement) or pee (urinate) You feel sick to your stomach (nauseous) or throw up (vomit) You have belly (abdominal) pain. You feel like you may pass out (faint). If you develop a fever.  Make Sure you: Understand these instructions. Will watch your condition Will get help right away if you are not doing well or get worse.  Your e-visit answers were reviewed by a board certified advanced clinical practitioner to complete your personal care plan.  Depending on the condition, your plan could have included both over the counter or prescription medications.  If there is a problem please reply  once you have received a response from your provider.  Your safety is important to [place].  If you have drug allergies check your prescription carefully.    You can use MyChart to ask questions about today's visit, request a non-urgent call back, or ask for a work or school excuse for 24 hours related to this e-Visit. If it has been greater than 24 hours you will need to follow up with your provider, or enter a new e-Visit to address those concerns.  You will get an e-mail in the next two days asking about your experience.  I hope that your e-visit has been valuable and will speed your recovery.  Thank you for using e-visits.  Approximately 5 minutes was spent documenting and reviewing patient's chart.

## 2023-01-02 ENCOUNTER — Telehealth: Payer: Medicaid Other | Admitting: Physician Assistant

## 2023-01-02 DIAGNOSIS — J40 Bronchitis, not specified as acute or chronic: Secondary | ICD-10-CM | POA: Diagnosis not present

## 2023-01-02 MED ORDER — PSEUDOEPHEDRINE HCL ER 120 MG PO TB12
120.0000 mg | ORAL_TABLET | Freq: Two times a day (BID) | ORAL | 0 refills | Status: AC
Start: 2023-01-02 — End: ?

## 2023-01-02 MED ORDER — BENZONATATE 200 MG PO CAPS
200.0000 mg | ORAL_CAPSULE | Freq: Three times a day (TID) | ORAL | 0 refills | Status: DC | PRN
Start: 2023-01-02 — End: 2023-02-19

## 2023-01-02 MED ORDER — ALBUTEROL SULFATE HFA 108 (90 BASE) MCG/ACT IN AERS
2.0000 | INHALATION_SPRAY | Freq: Four times a day (QID) | RESPIRATORY_TRACT | 0 refills | Status: DC | PRN
Start: 2023-01-02 — End: 2023-02-19

## 2023-01-02 NOTE — Progress Notes (Signed)
E-Visit for Cough   We are sorry that you are not feeling well.  Here is how we plan to help!  Based on your presentation I believe you most likely have A cough due to a virus.  This is called viral bronchitis and is best treated by rest, plenty of fluids and control of the cough.  You may use Ibuprofen or Tylenol as directed to help your symptoms.     In addition you may use A prescription cough medication called Tessalon Perles 100mg . You may take 1-2 capsules every 8 hours as needed for your cough.  A prescription for Sudafed as a decongestant will also be sent along with a prescription for an inhaler.   From your responses in the eVisit questionnaire you describe inflammation in the upper respiratory tract which is causing a significant cough.  This is commonly called Bronchitis and has four common causes:   Allergies Viral Infections Acid Reflux Bacterial Infection Allergies, viruses and acid reflux are treated by controlling symptoms or eliminating the cause. An example might be a cough caused by taking certain blood pressure medications. You stop the cough by changing the medication. Another example might be a cough caused by acid reflux. Controlling the reflux helps control the cough.  USE OF BRONCHODILATOR ("RESCUE") INHALERS: There is a risk from using your bronchodilator too frequently.  The risk is that over-reliance on a medication which only relaxes the muscles surrounding the breathing tubes can reduce the effectiveness of medications prescribed to reduce swelling and congestion of the tubes themselves.  Although you feel brief relief from the bronchodilator inhaler, your asthma may actually be worsening with the tubes becoming more swollen and filled with mucus.  This can delay other crucial treatments, such as oral steroid medications. If you need to use a bronchodilator inhaler daily, several times per day, you should discuss this with your provider.  There are probably better  treatments that could be used to keep your asthma under control.     HOME CARE Only take medications as instructed by your medical team. Complete the entire course of an antibiotic. Drink plenty of fluids and get plenty of rest. Avoid close contacts especially the very young and the elderly Cover your mouth if you cough or cough into your sleeve. Always remember to wash your hands A steam or ultrasonic humidifier can help congestion.   GET HELP RIGHT AWAY IF: You develop worsening fever. You become short of breath You cough up blood. Your symptoms persist after you have completed your treatment plan MAKE SURE YOU  Understand these instructions. Will watch your condition. Will get help right away if you are not doing well or get worse.    Thank you for choosing an e-visit.  Your e-visit answers were reviewed by a board certified advanced clinical practitioner to complete your personal care plan. Depending upon the condition, your plan could have included both over the counter or prescription medications.  Please review your pharmacy choice. Make sure the pharmacy is open so you can pick up prescription now. If there is a problem, you may contact your provider through [employer] and have the prescription routed to another pharmacy.  Your safety is important to [place]. If you have drug allergies check your prescription carefully.   For the next 24 hours you can use MyChart to ask questions about today's visit, request a non-urgent call back, or ask for a work or school excuse. You will get an email in the next  two days asking about your experience. I hope that your e-visit has been valuable and will speed your recovery.   I have spent 5 minutes in review of e-visit questionnaire, review and updating patient chart, medical decision making and response to patient.   Gilberto Better, PA-C

## 2023-01-11 ENCOUNTER — Telehealth: Payer: Medicaid Other

## 2023-01-11 DIAGNOSIS — B9689 Other specified bacterial agents as the cause of diseases classified elsewhere: Secondary | ICD-10-CM

## 2023-01-11 DIAGNOSIS — J019 Acute sinusitis, unspecified: Secondary | ICD-10-CM | POA: Diagnosis not present

## 2023-01-12 MED ORDER — DOXYCYCLINE HYCLATE 100 MG PO TABS
100.0000 mg | ORAL_TABLET | Freq: Two times a day (BID) | ORAL | 0 refills | Status: DC
Start: 2023-01-12 — End: 2023-02-19

## 2023-01-12 NOTE — Progress Notes (Signed)
I have spent 5 minutes in review of e-visit questionnaire, review and updating patient chart, medical decision making and response to patient.   Mia Milan Cody Jacklynn Dehaas, PA-C    

## 2023-01-12 NOTE — Progress Notes (Signed)

## 2023-02-19 ENCOUNTER — Telehealth: Payer: Medicaid Other | Admitting: Nurse Practitioner

## 2023-02-19 ENCOUNTER — Other Ambulatory Visit: Payer: Self-pay | Admitting: Nurse Practitioner

## 2023-02-19 DIAGNOSIS — M545 Low back pain, unspecified: Secondary | ICD-10-CM | POA: Diagnosis not present

## 2023-02-19 MED ORDER — PREDNISONE 20 MG PO TABS
20.0000 mg | ORAL_TABLET | Freq: Every day | ORAL | 0 refills | Status: AC
Start: 2023-02-19 — End: 2023-02-24

## 2023-02-19 MED ORDER — CYCLOBENZAPRINE HCL 10 MG PO TABS
5.0000 mg | ORAL_TABLET | Freq: Three times a day (TID) | ORAL | 0 refills | Status: DC | PRN
Start: 2023-02-19 — End: 2023-06-26

## 2023-02-19 NOTE — Progress Notes (Signed)
I have spent 5 minutes in review of e-visit questionnaire, review and updating patient chart, medical decision making and response to patient.  ° °Jerrell Mangel W Secilia Apps, NP ° °  °

## 2023-02-19 NOTE — Progress Notes (Signed)

## 2023-02-22 NOTE — Telephone Encounter (Signed)
Requested medication (s) are due for refill today: Yes  Requested medication (s) are on the active medication list: Yes  Last refill:  02/19/23  Future visit scheduled: No  Notes to clinic:  Unable to refill per protocol, cannot delegate.      Requested Prescriptions  Pending Prescriptions Disp Refills   predniSONE (DELTASONE) 20 MG tablet [Pharmacy Med Name: predniSONE 20 MG Oral Tablet] 5 tablet 0    Sig: TAKE 1 TABLET BY MOUTH ONCE DAILY WITH BREAKFAST FOR 5 DAYS     Not Delegated - Endocrinology:  Oral Corticosteroids Failed - 02/19/2023  6:53 PM      Failed - This refill cannot be delegated      Failed - Manual Review: Eye exam for IOP if prolonged treatment      Failed - Glucose (serum) in normal range and within 180 days    Glucose Tolerance, Fasting  Date Value Ref Range Status  05/19/2015 90 65 - 104 mg/dL Final   Glucose, Bld  Date Value Ref Range Status  10/03/2018 92 65 - 99 mg/dL Final    Comment:    .            Fasting reference interval .    POC Glucose  Date Value Ref Range Status  04/14/2015 87 70 - 99 mg/dl Final         Failed - K in normal range and within 180 days    Potassium  Date Value Ref Range Status  10/03/2018 4.0 3.5 - 5.3 mmol/L Final         Failed - Na in normal range and within 180 days    Sodium  Date Value Ref Range Status  10/03/2018 141 135 - 146 mmol/L Final         Failed - Last BP in normal range    BP Readings from Last 1 Encounters:  10/16/19 122/82         Failed - Valid encounter within last 6 months    Recent Outpatient Visits           2 years ago No-show for appointment   [facility] at [facility], [place], DO   3 years ago Contact with and (suspected) exposure to covid-19   [facility] Primary Care & Sports Medicine at [facility], Sung Amabile, NP   3 years ago Acute pain of right knee   [facility] at [facility], [place], DO   3 years ago Left breast mass   [facility] at [facility], [place], DO   4 years ago No-show for appointment   [facility] Primary Care & Sports Medicine at [facility], Lesly Rubenstein L, PA-C              Failed - Bone Mineral Density or Dexa Scan completed in the last 2 years       cyclobenzaprine (FLEXERIL) 10 MG tablet [Pharmacy Med Name: Cyclobenzaprine HCl 10 MG Oral Tablet] 30 tablet 0    Sig: TAKE 1/2 TO 1 (ONE-HALF TO ONE) TABLET BY MOUTH THREE TIMES DAILY AS NEEDED FOR MUSCLE SPASM     Not Delegated - Analgesics:  Muscle Relaxants Failed - 02/19/2023  6:53 PM      Failed - This refill cannot be delegated      Failed - Valid encounter within last 6 months    Recent  Outpatient Visits           2 years ago No-show for appointment   [facility] Care & Sports Medicine at [facility], [place], DO   3 years ago Contact with and (suspected) exposure to covid-19   [facility] Primary Care & Sports Medicine at [facility], Sung Amabile, NP   3 years ago Acute pain of right knee   [facility] at [facility], [place], DO   3 years ago Left breast mass   [facility] at [facility], Selena Batten, DO   4 years ago No-show for appointment   [facility] Primary Care & Sports Medicine at [facility], Lonna Cobb, [place]

## 2023-03-08 ENCOUNTER — Telehealth: Payer: Medicaid Other | Admitting: Physician Assistant

## 2023-03-08 DIAGNOSIS — J069 Acute upper respiratory infection, unspecified: Secondary | ICD-10-CM | POA: Diagnosis not present

## 2023-03-08 MED ORDER — VENTOLIN HFA 108 (90 BASE) MCG/ACT IN AERS: 1.0000 | INHALATION_SPRAY | Freq: Four times a day (QID) | RESPIRATORY_TRACT | 0 refills | Status: AC | PRN

## 2023-03-08 MED ORDER — BENZONATATE 100 MG PO CAPS: 100.0000 mg | ORAL_CAPSULE | Freq: Three times a day (TID) | ORAL | 0 refills | Status: DC | PRN

## 2023-03-08 NOTE — Progress Notes (Signed)
E-Visit for Cough   We are sorry that you are not feeling well.  Here is how we plan to help!  Based on your presentation I believe you most likely have A cough due to a virus.  This is called viral bronchitis and is best treated by rest, plenty of fluids and control of the cough.  You may use Ibuprofen or Tylenol as directed to help your symptoms.     In addition you may use A prescription cough medication called Tessalon Perles 100mg . You may take 1-2 capsules every 8 hours as needed for your cough. I have also sent in an albuterol inhaler to help relax airways and resolve wheezing.   From your responses in the eVisit questionnaire you describe inflammation in the upper respiratory tract which is causing a significant cough.  This is commonly called Bronchitis and has four common causes:   Allergies Viral Infections Acid Reflux Bacterial Infection Allergies, viruses and acid reflux are treated by controlling symptoms or eliminating the cause. An example might be a cough caused by taking certain blood pressure medications. You stop the cough by changing the medication. Another example might be a cough caused by acid reflux. Controlling the reflux helps control the cough.  USE OF BRONCHODILATOR ("RESCUE") INHALERS: There is a risk from using your bronchodilator too frequently.  The risk is that over-reliance on a medication which only relaxes the muscles surrounding the breathing tubes can reduce the effectiveness of medications prescribed to reduce swelling and congestion of the tubes themselves.  Although you feel brief relief from the bronchodilator inhaler, your asthma may actually be worsening with the tubes becoming more swollen and filled with mucus.  This can delay other crucial treatments, such as oral steroid medications. If you need to use a bronchodilator inhaler daily, several times per day, you should discuss this with your provider.  There are probably better treatments that could be  used to keep your asthma under control.     HOME CARE Only take medications as instructed by your medical team. Complete the entire course of an antibiotic. Drink plenty of fluids and get plenty of rest. Avoid close contacts especially the very young and the elderly Cover your mouth if you cough or cough into your sleeve. Always remember to wash your hands A steam or ultrasonic humidifier can help congestion.   GET HELP RIGHT AWAY IF: You develop worsening fever. You become short of breath You cough up blood. Your symptoms persist after you have completed your treatment plan MAKE SURE YOU  Understand these instructions. Will watch your condition. Will get help right away if you are not doing well or get worse.    Thank you for choosing an e-visit.  Your e-visit answers were reviewed by a board certified advanced clinical practitioner to complete your personal care plan. Depending upon the condition, your plan could have included both over the counter or prescription medications.  Please review your pharmacy choice. Make sure the pharmacy is open so you can pick up prescription now. If there is a problem, you may contact your provider through [employer] and have the prescription routed to another pharmacy.  Your safety is important to [place]. If you have drug allergies check your prescription carefully.   For the next 24 hours you can use MyChart to ask questions about today's visit, request a non-urgent call back, or ask for a work or school excuse. You will get an email in the next two days asking about  your experience. I hope that your e-visit has been valuable and will speed your recovery.

## 2023-03-08 NOTE — Progress Notes (Signed)
I have spent 5 minutes in review of e-visit questionnaire, review and updating patient chart, medical decision making and response to patient.   Mia Milan Cody Jacklynn Dehaas, PA-C    

## 2023-03-10 ENCOUNTER — Telehealth: Payer: Medicaid Other | Admitting: Physician Assistant

## 2023-03-10 DIAGNOSIS — B9689 Other specified bacterial agents as the cause of diseases classified elsewhere: Secondary | ICD-10-CM

## 2023-03-10 MED ORDER — DOXYCYCLINE HYCLATE 100 MG PO TABS: 100.0000 mg | ORAL_TABLET | Freq: Two times a day (BID) | ORAL | 0 refills | Status: DC

## 2023-03-10 NOTE — Progress Notes (Signed)

## 2023-03-26 ENCOUNTER — Telehealth: Payer: Medicaid Other | Admitting: Physician Assistant

## 2023-03-26 DIAGNOSIS — M546 Pain in thoracic spine: Secondary | ICD-10-CM | POA: Diagnosis not present

## 2023-03-26 MED ORDER — CYCLOBENZAPRINE HCL 10 MG PO TABS
10.0000 mg | ORAL_TABLET | Freq: Three times a day (TID) | ORAL | 0 refills | Status: DC | PRN
Start: 2023-03-26 — End: 2023-06-26

## 2023-03-26 NOTE — Progress Notes (Signed)
 E-Visit for Back Pain   We are sorry that you are not feeling well.  Here is how we plan to help!  Based on what you have shared with me it looks like you mostly have acute back pain.  Acute back pain is defined as musculoskeletal pain that can resolve in 1-3 weeks with conservative treatment.  I have prescribed  Flexeril  10 mg every eight hours as needed which is a muscle relaxer  I recommend Ibuprofen as well. Some patients experience stomach irritation or in increased heartburn with anti-inflammatory drugs.  Please keep in mind that muscle relaxer's can cause fatigue and should not be taken while at work or driving.  Back pain is very common.  The pain often gets better over time.  The cause of back pain is usually not dangerous.  Most people can learn to manage their back pain on their own.  Home Care Stay active.  Start with short walks on flat ground if you can.  Try to walk farther each day. Do not sit, drive or stand in one place for more than 30 minutes.  Do not stay in bed. Do not avoid exercise or work.  Activity can help your back heal faster. Be careful when you bend or lift an object.  Bend at your knees, keep the object close to you, and do not twist. Sleep on a firm mattress.  Lie on your side, and bend your knees.  If you lie on your back, put a pillow under your knees. Only take medicines as told by your doctor. Put ice on the injured area. Put ice in a plastic bag Place a towel between your skin and the bag Leave the ice on for 15-20 minutes, 3-4 times a day for the first 2-3 days. 210 After that, you can switch between ice and heat packs. Ask your doctor about back exercises or massage. Avoid feeling anxious or stressed.  Find good ways to deal with stress, such as exercise.  Get Help Right Way If: Your pain does not go away with rest or medicine. Your pain does not go away in 1 week. You have new problems. You do not feel well. The pain spreads into your legs. You  cannot control when you poop (bowel movement) or pee (urinate) You feel sick to your stomach (nauseous) or throw up (vomit) You have belly (abdominal) pain. You feel like you may pass out (faint). If you develop a fever.  Make Sure you: Understand these instructions. Will watch your condition Will get help right away if you are not doing well or get worse.  Your e-visit answers were reviewed by a board certified advanced clinical practitioner to complete your personal care plan.  Depending on the condition, your plan could have included both over the counter or prescription medications.  If there is a problem please reply  once you have received a response from your provider.  Your safety is important to us .  If you have drug allergies check your prescription carefully.    You can use MyChart to ask questions about today's visit, request a non-urgent call back, or ask for a work or school excuse for 24 hours related to this e-Visit. If it has been greater than 24 hours you will need to follow up with your provider, or enter a new e-Visit to address those concerns.  You will get an e-mail in the next two days asking about your experience.  I hope that your e-visit has been  valuable and will speed your recovery. Thank you for using e-visits.   I have spent 5 minutes in review of e-visit questionnaire, review and updating patient chart, medical decision making and response to patient.   Harlene PEDLAR Ward, PA-C

## 2023-05-08 ENCOUNTER — Telehealth: Payer: Medicaid Other | Admitting: Family

## 2023-05-08 DIAGNOSIS — B372 Candidiasis of skin and nail: Secondary | ICD-10-CM | POA: Diagnosis not present

## 2023-05-08 MED ORDER — NYSTATIN 100000 UNIT/GM EX POWD: 1.0000 | Freq: Three times a day (TID) | CUTANEOUS | 0 refills | Status: DC

## 2023-05-08 MED ORDER — NYSTATIN 100000 UNIT/GM EX CREA: 1.0000 | TOPICAL_CREAM | Freq: Two times a day (BID) | CUTANEOUS | 1 refills | Status: DC

## 2023-05-08 NOTE — Progress Notes (Signed)

## 2023-06-26 ENCOUNTER — Telehealth: Admitting: Nurse Practitioner

## 2023-06-26 DIAGNOSIS — M546 Pain in thoracic spine: Secondary | ICD-10-CM | POA: Diagnosis not present

## 2023-06-26 MED ORDER — CYCLOBENZAPRINE HCL 10 MG PO TABS: 10.0000 mg | ORAL_TABLET | Freq: Three times a day (TID) | ORAL | 0 refills | Status: DC | PRN

## 2023-06-26 NOTE — Progress Notes (Signed)

## 2023-06-26 NOTE — Progress Notes (Signed)
 I have spent 5 minutes in review of e-visit questionnaire, review and updating patient chart, medical decision making and response to patient.   Claiborne Rigg, NP

## 2023-08-13 ENCOUNTER — Telehealth: Admitting: Physician Assistant

## 2023-08-13 DIAGNOSIS — R3989 Other symptoms and signs involving the genitourinary system: Secondary | ICD-10-CM | POA: Diagnosis not present

## 2023-08-13 MED ORDER — NITROFURANTOIN MONOHYD MACRO 100 MG PO CAPS: 100.0000 mg | ORAL_CAPSULE | Freq: Two times a day (BID) | ORAL | 0 refills | Status: DC

## 2023-08-13 NOTE — Progress Notes (Signed)

## 2023-09-09 ENCOUNTER — Telehealth: Admitting: Physician Assistant

## 2023-09-09 DIAGNOSIS — N390 Urinary tract infection, site not specified: Secondary | ICD-10-CM

## 2023-09-09 NOTE — Progress Notes (Signed)
  Because you have recently had a UTI that was treated with antibiotics within the last month, I feel your condition warrants further evaluation and I recommend that you be seen in a face-to-face visit to have a urine culture obtained.   NOTE: There will be NO CHARGE for this E-Visit   If you are having a true medical emergency, please call 911.     For an urgent face to face visit, [facility] has multiple urgent care centers for your convenience.  Click the link below for the full list of locations and hours, walk-in wait times, appointment scheduling options and driving directions:  Urgent Care - [place], [place], [place], [place], [place], [place]  [place]     Your MyChart E-visit questionnaire answers were reviewed by a board certified advanced clinical practitioner to complete your personal care plan based on your specific symptoms.    Thank you for using e-Visits.     I have spent 5 minutes in review of e-visit questionnaire, review and updating patient chart, medical decision making and response to patient.   Delon CHRISTELLA Dickinson, PA-C

## 2023-11-03 ENCOUNTER — Telehealth: Admitting: Physician Assistant

## 2023-11-03 ENCOUNTER — Encounter

## 2023-11-03 DIAGNOSIS — B029 Zoster without complications: Secondary | ICD-10-CM

## 2023-11-04 MED ORDER — GABAPENTIN 100 MG PO CAPS: 100.0000 mg | ORAL_CAPSULE | Freq: Three times a day (TID) | ORAL | 0 refills | Status: AC | PRN

## 2023-11-04 MED ORDER — VALACYCLOVIR HCL 1 G PO TABS: 1000.0000 mg | ORAL_TABLET | Freq: Three times a day (TID) | ORAL | 0 refills | Status: AC

## 2023-11-04 NOTE — Progress Notes (Signed)

## 2023-11-25 ENCOUNTER — Telehealth: Admitting: Nurse Practitioner

## 2023-11-25 DIAGNOSIS — J069 Acute upper respiratory infection, unspecified: Secondary | ICD-10-CM

## 2023-11-26 MED ORDER — IPRATROPIUM BROMIDE 0.03 % NA SOLN: 2.0000 | Freq: Two times a day (BID) | NASAL | 0 refills | Status: AC

## 2023-11-26 MED ORDER — BENZONATATE 200 MG PO CAPS: 200.0000 mg | ORAL_CAPSULE | Freq: Two times a day (BID) | ORAL | 0 refills | Status: AC | PRN

## 2023-11-26 NOTE — Progress Notes (Signed)
 I have spent 5 minutes in review of e-visit questionnaire, review and updating patient chart, medical decision making and response to patient.   Claiborne Rigg, NP

## 2023-11-26 NOTE — Progress Notes (Signed)
 E-Visit for Upper Respiratory Infection   We are sorry you are not feeling well.  Here is how we plan to help!    Based on what you have shared with me, it looks like you may have a viral upper respiratory infection.  Upper respiratory infections are caused by a large number of viruses; however, rhinovirus is the most common cause.   The most common symptoms associated with an upper respiratory infection are nasal discharge or congestion, cough, sneezing, headache and pressure in the ears and face.  These symptoms usually persist for about 3 to 10 days, but can last up to 2 weeks.  It is important to know that upper respiratory infections do not cause serious illness or complications in most cases.  Providers prescribe antibiotics to treat infections caused by bacteria. Antibiotics are very powerful in treating bacterial infections when they are used properly. To maintain their effectiveness, they should be used only when necessary. Overuse of antibiotics has resulted in the development of superbugs that are resistant to treatment!    After careful review of your answers, I would not recommend an antibiotic for your condition.  Antibiotics are not effective against viruses and therefore should not be used to treat them. Common examples of infections caused by viruses include colds and flu   Symptoms vary from person to person, with common symptoms including sore throat, cough, fatigue or lack of energy and feeling of general discomfort.  A low-grade fever of up to 100.4 may present, but is often uncommon.  Symptoms vary however, and are closely related to a person's age or underlying illnesses.   Upper respiratory infections can be transmitted from person to person, with the most common method of transmission being a person's hands.  The virus is able to live on the skin and can infect other persons for up to 2 hours after direct contact.  Also, these can be transmitted when someone coughs or sneezes;  thus, it is important to cover the mouth to reduce this risk.  To keep the spread of the illness at bay, good hand hygiene is very important.  This is an infection that is most likely caused by a virus. There are no specific treatments other than to help you with the symptoms until the infection runs its course.  We are sorry you are not feeling well.  Here is how we plan to help!   For nasal congestion, you may use an oral decongestants such as Mucinex D or if you have glaucoma or high blood pressure use plain Mucinex.  Saline nasal spray or nasal drops can help and can safely be used as often as needed for congestion.  For your congestion, I have prescribed Ipratropium Bromide  nasal spray 0.03% two sprays in each nostril 2-3 times a day  If you do not have a history of heart disease, hypertension, diabetes or thyroid  disease, prostate/bladder issues or glaucoma, you may also use Sudafed to treat nasal congestion.  It is highly recommended that you consult with a pharmacist or your primary care physician to ensure this medication is safe for you to take.     If you have a cough, you may use cough suppressants such as Delsym and Robitussin.  If you have glaucoma or high blood pressure, you can also use Coricidin HBP.   For cough I have prescribed for you A prescription cough medication called Tessalon  Perles 100 mg. You may take 1-2 capsules every 8 hours as needed for cough  If you have a sore or scratchy throat, use a saltwater gargle-  to  teaspoon of salt dissolved in a 4-ounce to 8-ounce glass of warm water.  Gargle the solution for approximately 15-30 seconds and then spit.  It is important not to swallow the solution.  You can also use throat lozenges/cough drops and Chloraseptic spray to help with throat pain or discomfort.  Warm or cold liquids can also be helpful in relieving throat pain.  For headache, pain or general discomfort, you can use Ibuprofen or Tylenol  as directed.   Some  authorities believe that zinc sprays or the use of Echinacea may shorten the course of your symptoms.   HOME CARE Only take medications as instructed by your medical team. Be sure to drink plenty of fluids. Water is fine as well as fruit juices, sodas and electrolyte beverages. You may want to stay away from caffeine or alcohol. If you are nauseated, try taking small sips of liquids. How do you know if you are getting enough fluid? Your urine should be a pale yellow or almost colorless. Get rest. Taking a steamy shower or using a humidifier may help nasal congestion and ease sore throat pain. You can place a towel over your head and breathe in the steam from hot water coming from a faucet. Using a saline nasal spray works much the same way. Cough drops, hard candies and sore throat lozenges may ease your cough. Avoid close contacts especially the very young and the elderly Cover your mouth if you cough or sneeze Always remember to wash your hands.   GET HELP RIGHT AWAY IF: You develop worsening fever. If your symptoms do not improve within 10 days You develop yellow or green discharge from your nose over 3 days. You have coughing fits You develop a severe head ache or visual changes. You develop shortness of breath, difficulty breathing or start having chest pain Your symptoms persist after you have completed your treatment plan  MAKE SURE YOU  Understand these instructions. Will watch your condition. Will get help right away if you are not doing well or get worse.  Thank you for choosing an e-visit.  Your e-visit answers were reviewed by a board certified advanced clinical practitioner to complete your personal care plan. Depending upon the condition, your plan could have included both over the counter or prescription medications.  Please review your pharmacy choice. Make sure the pharmacy is open so you can pick up prescription now. If there is a problem, you may contact your  provider through [employer] and have the prescription routed to another pharmacy.  Your safety is important to us . If you have drug allergies check your prescription carefully.   For the next 24 hours you can use MyChart to ask questions about today's visit, request a non-urgent call back, or ask for a work or school excuse. You will get an email in the next two days asking about your experience. I hope that your e-visit has been valuable and will speed your recovery.

## 2024-03-06 ENCOUNTER — Telehealth: Admitting: Family Medicine

## 2024-03-06 DIAGNOSIS — J019 Acute sinusitis, unspecified: Secondary | ICD-10-CM

## 2024-03-07 MED ORDER — DOXYCYCLINE HYCLATE 100 MG PO TABS: 100.0000 mg | ORAL_TABLET | Freq: Two times a day (BID) | ORAL | 0 refills | Status: AC

## 2024-03-07 NOTE — Progress Notes (Signed)
 E-Visit for Sinus Problems  We are sorry that you are not feeling well.  Here is how we plan to help!  Based on what you have shared with me it looks like you have sinusitis.  Sinusitis is inflammation and infection in the sinus cavities of the head.  Based on your presentation I believe you most likely have Acute Bacterial Sinusitis.  This is an infection caused by bacteria and is treated with antibiotics. I have prescribed Doxycycline  100mg  by mouth twice a day for 7 days. You may use an oral decongestant such as Mucinex  D or if you have glaucoma or high blood pressure use plain Mucinex . Saline nasal spray help and can safely be used as often as needed for congestion.  If you develop worsening sinus pain, fever or notice severe headache and vision changes, or if symptoms are not better after completion of antibiotic, please schedule an appointment with a health care provider.    Sinus infections are not as easily transmitted as other respiratory infection, however we still recommend that you avoid close contact with loved ones, especially the very young and elderly.  Remember to wash your hands thoroughly throughout the day as this is the number one way to prevent the spread of infection!  Home Care: Only take medications as instructed by your medical team. Complete the entire course of an antibiotic. Do not take these medications with alcohol. A steam or ultrasonic humidifier can help congestion.  You can place a towel over your head and breathe in the steam from hot water coming from a faucet. Avoid close contacts especially the very young and the elderly. Cover your mouth when you cough or sneeze. Always remember to wash your hands.  Get Help Right Away If: You develop worsening fever or sinus pain. You develop a severe head ache or visual changes. Your symptoms persist after you have completed your treatment plan.  Make sure you Understand these instructions. Will watch your  condition. Will get help right away if you are not doing well or get worse.  Your e-visit answers were reviewed by a board certified advanced clinical practitioner to complete your personal care plan.  Depending on the condition, your plan could have included both over the counter or prescription medications.  If there is a problem please reply  once you have received a response from your provider.  Your safety is important to us .  If you have drug allergies check your prescription carefully.    You can use MyChart to ask questions about today's visit, request a non-urgent call back, or ask for a work or school excuse for 24 hours related to this e-Visit. If it has been greater than 24 hours you will need to follow up with your provider, or enter a new e-Visit to address those concerns.  You will get an e-mail in the next two days asking about your experience.  I hope that your e-visit has been valuable and will speed your recovery. Thank you for using e-visits.  I have spent 5 minutes in review of e-visit questionnaire, review and updating patient chart, medical decision making and response to patient.   Roosvelt Mater, PA-C

## 2024-03-19 ENCOUNTER — Telehealth: Admitting: Physician Assistant

## 2024-03-19 DIAGNOSIS — H109 Unspecified conjunctivitis: Secondary | ICD-10-CM

## 2024-03-19 MED ORDER — OFLOXACIN 0.3 % OP SOLN: 1.0000 [drp] | Freq: Four times a day (QID) | OPHTHALMIC | 0 refills | Status: AC

## 2024-03-19 NOTE — Progress Notes (Signed)
